# Patient Record
Sex: Female | Born: 1975 | Race: White | Hispanic: No | Smoking: Never smoker
Health system: Southern US, Community
[De-identification: ages and names within clinical notes are randomized; demographics above are authoritative.]

## PROBLEM LIST (undated history)

## (undated) DIAGNOSIS — Z8759 Personal history of other complications of pregnancy, childbirth and the puerperium: Secondary | ICD-10-CM

## (undated) DIAGNOSIS — G43109 Migraine with aura, not intractable, without status migrainosus: Secondary | ICD-10-CM

## (undated) DIAGNOSIS — E669 Obesity, unspecified: Secondary | ICD-10-CM

## (undated) DIAGNOSIS — N920 Excessive and frequent menstruation with regular cycle: Secondary | ICD-10-CM

## (undated) DIAGNOSIS — Z9889 Other specified postprocedural states: Secondary | ICD-10-CM

## (undated) DIAGNOSIS — M47812 Spondylosis without myelopathy or radiculopathy, cervical region: Secondary | ICD-10-CM

## (undated) DIAGNOSIS — K5790 Diverticulosis of intestine, part unspecified, without perforation or abscess without bleeding: Secondary | ICD-10-CM

## (undated) DIAGNOSIS — E611 Iron deficiency: Secondary | ICD-10-CM

## (undated) DIAGNOSIS — K589 Irritable bowel syndrome without diarrhea: Secondary | ICD-10-CM

## (undated) DIAGNOSIS — E559 Vitamin D deficiency, unspecified: Secondary | ICD-10-CM

## (undated) DIAGNOSIS — M26629 Arthralgia of temporomandibular joint, unspecified side: Secondary | ICD-10-CM

## (undated) HISTORY — DX: Iron deficiency: E61.1

## (undated) HISTORY — DX: Spondylosis without myelopathy or radiculopathy, cervical region: M47.812

## (undated) HISTORY — DX: Diverticulosis of intestine, part unspecified, without perforation or abscess without bleeding: K57.90

## (undated) HISTORY — DX: Arthralgia of temporomandibular joint, unspecified side: M26.629

## (undated) HISTORY — DX: Obesity, unspecified: E66.9

## (undated) HISTORY — PX: COLONOSCOPY: SHX174

## (undated) HISTORY — DX: Excessive and frequent menstruation with regular cycle: N92.0

## (undated) HISTORY — PX: TYMPANOSTOMY TUBE PLACEMENT: SHX32

## (undated) HISTORY — PX: CHOLECYSTECTOMY: SHX55

## (undated) HISTORY — DX: Vitamin D deficiency, unspecified: E55.9

## (undated) HISTORY — DX: Migraine with aura, not intractable, without status migrainosus: G43.109

## (undated) HISTORY — DX: Personal history of other complications of pregnancy, childbirth and the puerperium: Z87.59

## (undated) HISTORY — DX: Irritable bowel syndrome, unspecified: K58.9

## (undated) HISTORY — PX: LEEP: SHX91

## (undated) HISTORY — DX: Other specified postprocedural states: Z98.890

---

## 1999-02-03 ENCOUNTER — Other Ambulatory Visit: Admission: RE | Admit: 1999-02-03 | Discharge: 1999-02-03 | Payer: Self-pay | Admitting: Gynecology

## 1999-05-08 ENCOUNTER — Other Ambulatory Visit: Admission: RE | Admit: 1999-05-08 | Discharge: 1999-05-08 | Payer: Self-pay | Admitting: Gynecology

## 1999-07-01 ENCOUNTER — Other Ambulatory Visit: Admission: RE | Admit: 1999-07-01 | Discharge: 1999-07-01 | Payer: Self-pay | Admitting: Gynecology

## 1999-07-01 ENCOUNTER — Encounter (INDEPENDENT_AMBULATORY_CARE_PROVIDER_SITE_OTHER): Payer: Self-pay

## 1999-10-07 ENCOUNTER — Other Ambulatory Visit: Admission: RE | Admit: 1999-10-07 | Discharge: 1999-10-07 | Payer: Self-pay | Admitting: Gynecology

## 2000-02-16 ENCOUNTER — Other Ambulatory Visit: Admission: RE | Admit: 2000-02-16 | Discharge: 2000-02-16 | Payer: Self-pay | Admitting: Gynecology

## 2000-06-13 ENCOUNTER — Other Ambulatory Visit: Admission: RE | Admit: 2000-06-13 | Discharge: 2000-06-13 | Payer: Self-pay | Admitting: Gynecology

## 2000-10-10 ENCOUNTER — Other Ambulatory Visit: Admission: RE | Admit: 2000-10-10 | Discharge: 2000-10-10 | Payer: Self-pay | Admitting: Gynecology

## 2001-05-31 ENCOUNTER — Other Ambulatory Visit: Admission: RE | Admit: 2001-05-31 | Discharge: 2001-05-31 | Payer: Self-pay | Admitting: Obstetrics and Gynecology

## 2001-10-06 ENCOUNTER — Other Ambulatory Visit: Admission: RE | Admit: 2001-10-06 | Discharge: 2001-10-06 | Payer: Self-pay | Admitting: Gynecology

## 2002-05-11 ENCOUNTER — Encounter: Payer: Self-pay | Admitting: Gastroenterology

## 2002-05-11 ENCOUNTER — Ambulatory Visit (HOSPITAL_COMMUNITY): Admission: RE | Admit: 2002-05-11 | Discharge: 2002-05-11 | Payer: Self-pay | Admitting: Gastroenterology

## 2002-06-28 ENCOUNTER — Encounter: Payer: Self-pay | Admitting: Surgery

## 2002-06-28 ENCOUNTER — Observation Stay (HOSPITAL_COMMUNITY): Admission: RE | Admit: 2002-06-28 | Discharge: 2002-06-29 | Payer: Self-pay | Admitting: Surgery

## 2002-06-28 ENCOUNTER — Encounter (INDEPENDENT_AMBULATORY_CARE_PROVIDER_SITE_OTHER): Payer: Self-pay | Admitting: Specialist

## 2002-06-30 ENCOUNTER — Observation Stay (HOSPITAL_COMMUNITY): Admission: EM | Admit: 2002-06-30 | Discharge: 2002-07-01 | Payer: Self-pay | Admitting: Emergency Medicine

## 2002-06-30 ENCOUNTER — Encounter: Payer: Self-pay | Admitting: Emergency Medicine

## 2002-07-10 ENCOUNTER — Other Ambulatory Visit: Admission: RE | Admit: 2002-07-10 | Discharge: 2002-07-10 | Payer: Self-pay | Admitting: Gynecology

## 2002-07-24 ENCOUNTER — Encounter: Payer: Self-pay | Admitting: Gynecology

## 2002-07-24 ENCOUNTER — Encounter: Admission: RE | Admit: 2002-07-24 | Discharge: 2002-07-24 | Payer: Self-pay | Admitting: Gynecology

## 2002-10-18 HISTORY — PX: COLONOSCOPY: SHX174

## 2003-09-18 ENCOUNTER — Other Ambulatory Visit: Admission: RE | Admit: 2003-09-18 | Discharge: 2003-09-18 | Payer: Self-pay | Admitting: Gynecology

## 2004-09-23 ENCOUNTER — Other Ambulatory Visit: Admission: RE | Admit: 2004-09-23 | Discharge: 2004-09-23 | Payer: Self-pay | Admitting: Gynecology

## 2005-05-17 ENCOUNTER — Other Ambulatory Visit: Admission: RE | Admit: 2005-05-17 | Discharge: 2005-05-17 | Payer: Self-pay | Admitting: Gynecology

## 2005-08-05 ENCOUNTER — Inpatient Hospital Stay (HOSPITAL_COMMUNITY): Admission: AD | Admit: 2005-08-05 | Discharge: 2005-08-06 | Payer: Self-pay | Admitting: Gynecology

## 2005-12-16 ENCOUNTER — Inpatient Hospital Stay (HOSPITAL_COMMUNITY): Admission: RE | Admit: 2005-12-16 | Discharge: 2005-12-19 | Payer: Self-pay | Admitting: Gynecology

## 2006-01-20 ENCOUNTER — Other Ambulatory Visit: Admission: RE | Admit: 2006-01-20 | Discharge: 2006-01-20 | Payer: Self-pay | Admitting: Gynecology

## 2007-04-27 DIAGNOSIS — Z8759 Personal history of other complications of pregnancy, childbirth and the puerperium: Secondary | ICD-10-CM

## 2007-04-27 HISTORY — DX: Personal history of other complications of pregnancy, childbirth and the puerperium: Z87.59

## 2007-06-12 LAB — CONVERTED CEMR LAB: Pap Smear: NORMAL

## 2007-07-25 ENCOUNTER — Ambulatory Visit (HOSPITAL_COMMUNITY): Admission: AD | Admit: 2007-07-25 | Discharge: 2007-07-26 | Payer: Self-pay | Admitting: Obstetrics and Gynecology

## 2007-07-26 ENCOUNTER — Encounter (INDEPENDENT_AMBULATORY_CARE_PROVIDER_SITE_OTHER): Payer: Self-pay | Admitting: Obstetrics and Gynecology

## 2007-07-26 HISTORY — PX: LAPAROSCOPY FOR ECTOPIC PREGNANCY: SUR765

## 2007-09-20 ENCOUNTER — Ambulatory Visit: Payer: Self-pay | Admitting: Internal Medicine

## 2007-09-20 LAB — CONVERTED CEMR LAB
Albumin: 3.9 g/dL (ref 3.5–5.2)
Alkaline Phosphatase: 77 units/L (ref 39–117)
BUN: 11 mg/dL (ref 6–23)
Bilirubin, Direct: 0.1 mg/dL (ref 0.0–0.3)
Calcium: 9 mg/dL (ref 8.4–10.5)
Crystals: NEGATIVE
Eosinophils Absolute: 0.1 10*3/uL (ref 0.0–0.7)
GFR calc Af Amer: 109 mL/min
GFR calc non Af Amer: 90 mL/min
HCT: 40.3 % (ref 36.0–46.0)
HDL: 46.6 mg/dL (ref >39.0)
Ketones, ur: NEGATIVE mg/dL
MCHC: 34 g/dL (ref 30.0–36.0)
MCV: 85 fL (ref 78.0–100.0)
Monocytes Absolute: 0.5 10*3/uL (ref 0.1–1.0)
Neutro Abs: 3 10*3/uL (ref 1.4–7.7)
Platelets: 264 10*3/uL (ref 150–400)
Potassium: 4.4 meq/L (ref 3.5–5.1)
RDW: 12.5 % (ref 11.5–14.6)
Sodium: 141 meq/L (ref 135–145)
Triglycerides: 53 mg/dL (ref 0–149)
Urine Glucose: NEGATIVE mg/dL
VLDL: 11 mg/dL (ref 0–40)

## 2007-09-21 ENCOUNTER — Telehealth: Payer: Self-pay | Admitting: Internal Medicine

## 2007-09-29 ENCOUNTER — Ambulatory Visit: Payer: Self-pay | Admitting: Internal Medicine

## 2007-09-29 DIAGNOSIS — E66812 Obesity, class 2: Secondary | ICD-10-CM

## 2007-09-29 DIAGNOSIS — E669 Obesity, unspecified: Secondary | ICD-10-CM

## 2007-09-29 HISTORY — DX: Obesity, class 2: E66.812

## 2007-09-29 HISTORY — DX: Obesity, unspecified: E66.9

## 2007-10-06 ENCOUNTER — Encounter: Admission: RE | Admit: 2007-10-06 | Discharge: 2007-10-06 | Payer: Self-pay | Admitting: Internal Medicine

## 2007-10-08 ENCOUNTER — Telehealth: Payer: Self-pay | Admitting: Internal Medicine

## 2008-06-28 ENCOUNTER — Ambulatory Visit (HOSPITAL_COMMUNITY): Admission: RE | Admit: 2008-06-28 | Discharge: 2008-06-28 | Payer: Self-pay | Admitting: Obstetrics and Gynecology

## 2008-07-11 ENCOUNTER — Ambulatory Visit (HOSPITAL_COMMUNITY): Admission: RE | Admit: 2008-07-11 | Discharge: 2008-07-11 | Payer: Self-pay | Admitting: Obstetrics and Gynecology

## 2008-08-22 ENCOUNTER — Ambulatory Visit (HOSPITAL_COMMUNITY): Admission: RE | Admit: 2008-08-22 | Discharge: 2008-08-22 | Payer: Self-pay | Admitting: Obstetrics and Gynecology

## 2008-09-19 ENCOUNTER — Ambulatory Visit (HOSPITAL_COMMUNITY): Admission: RE | Admit: 2008-09-19 | Discharge: 2008-09-19 | Payer: Self-pay | Admitting: Obstetrics and Gynecology

## 2008-12-03 ENCOUNTER — Inpatient Hospital Stay (HOSPITAL_COMMUNITY): Admission: RE | Admit: 2008-12-03 | Discharge: 2008-12-06 | Payer: Self-pay | Admitting: Obstetrics and Gynecology

## 2008-12-21 IMAGING — US US SOFT TISSUE HEAD/NECK
1 series · 14 of 25 positions shown · non-contrast
Comparison: None

CLINICAL DATA: Prominence of the right side of the thyroid
clinically

THYROID ULTRASOUND
TECHNIQUE: Ultrasound examination of the thyroid gland and all
adjacent soft tissues was performed.

[Series 1: us soft tissue head/neck · 0.08mm/px · 14 of 38 slices shown]
[im 1/38]
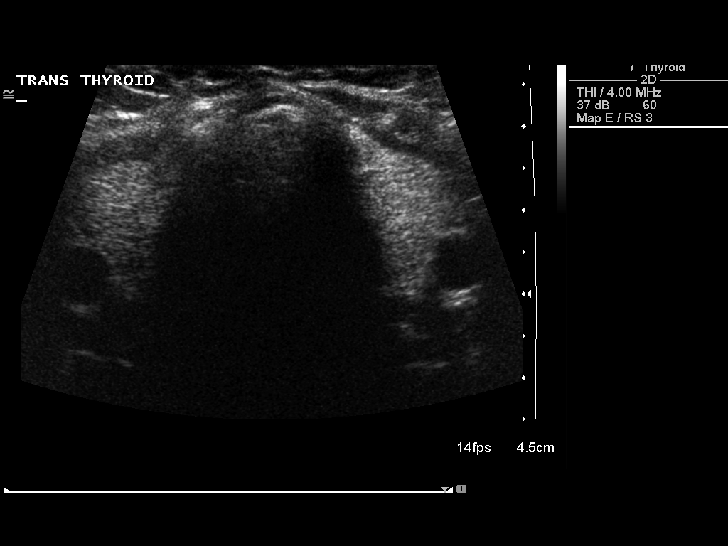
[im 4/38]
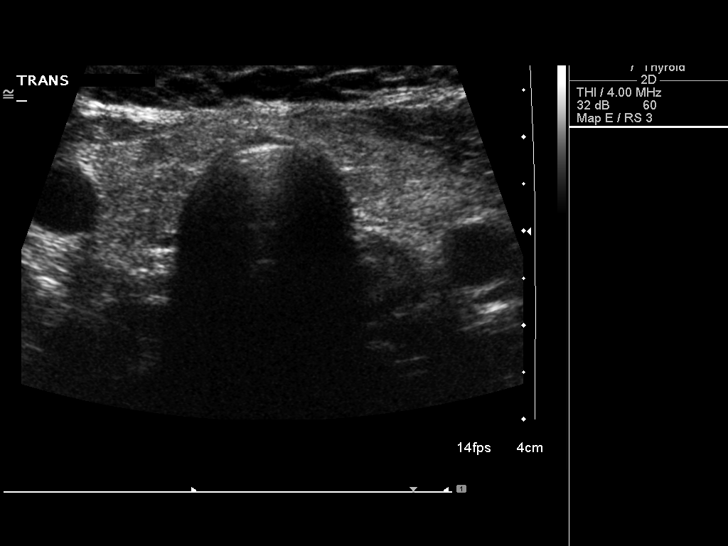
[im 7/38]
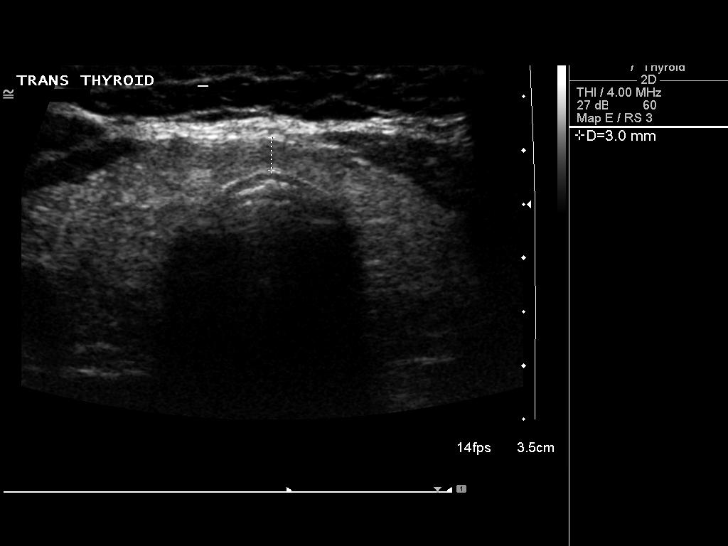
[im 10/38]
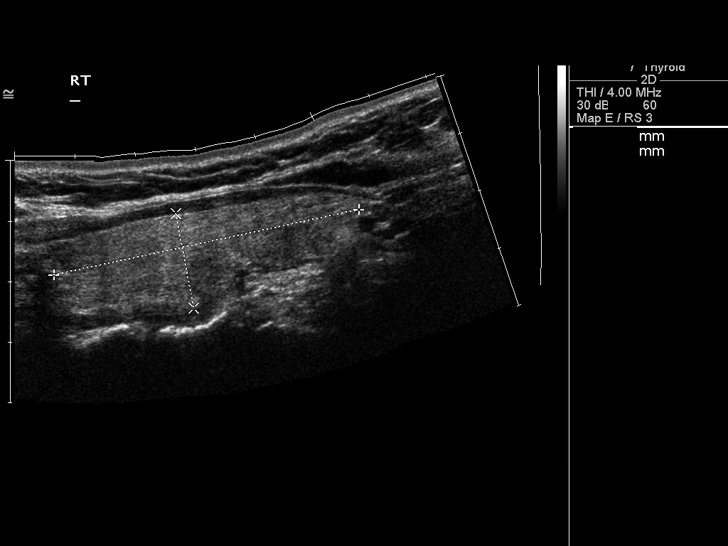
[im 13/38]
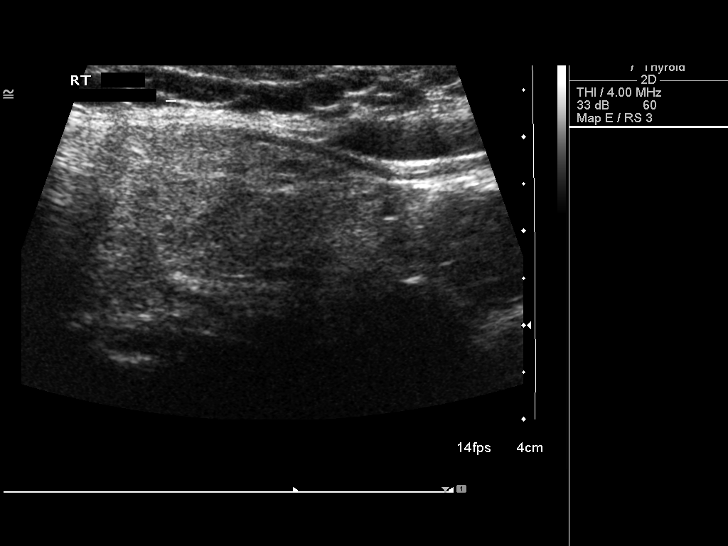
[im 14/38]
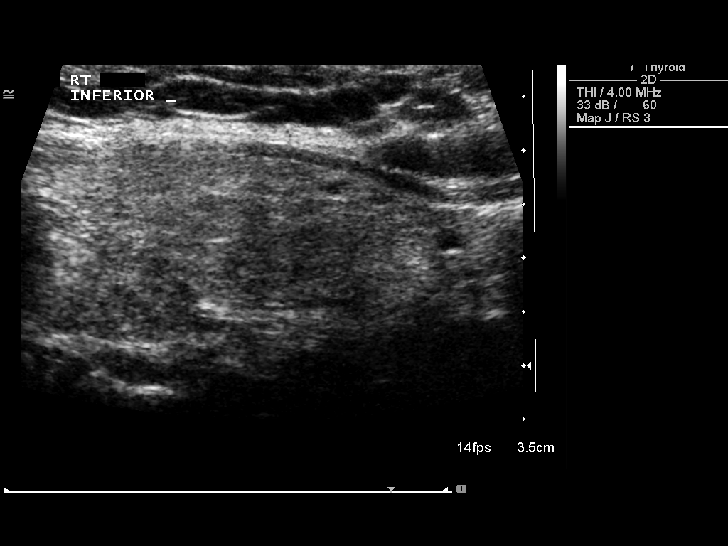
[im 17/38]
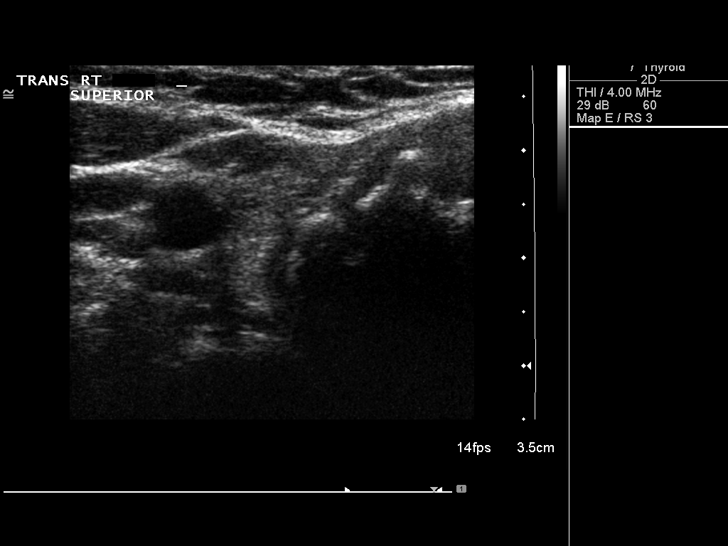
[im 21/38]
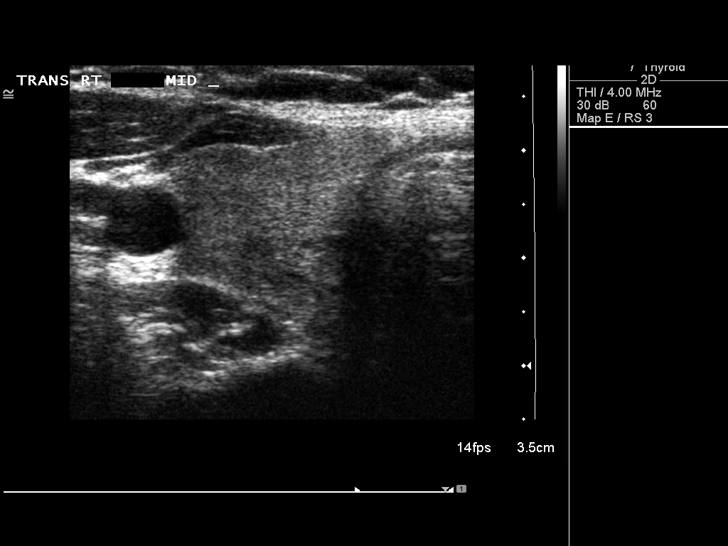
[im 24/38]
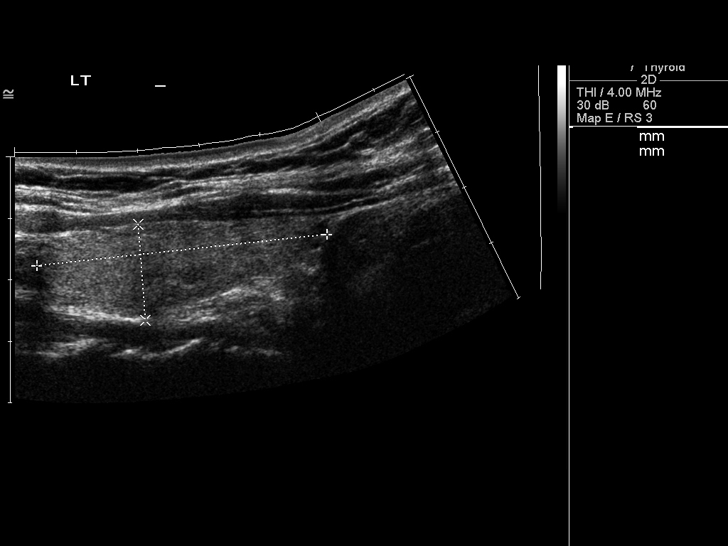
[im 25/38]
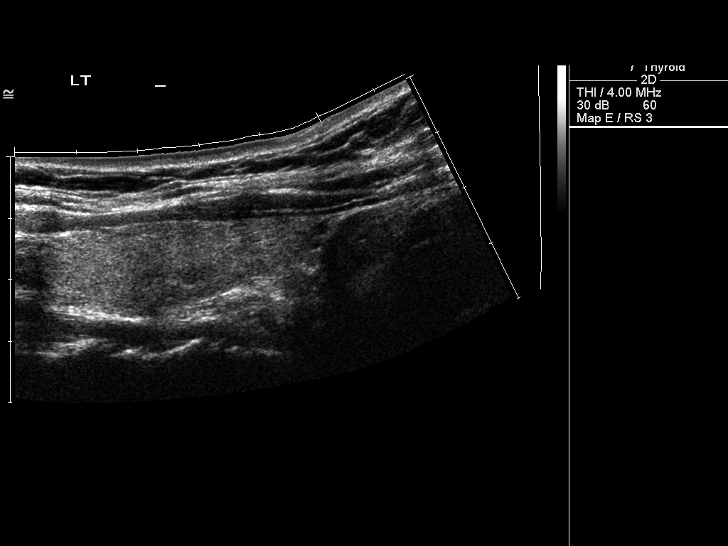
[im 28/38]
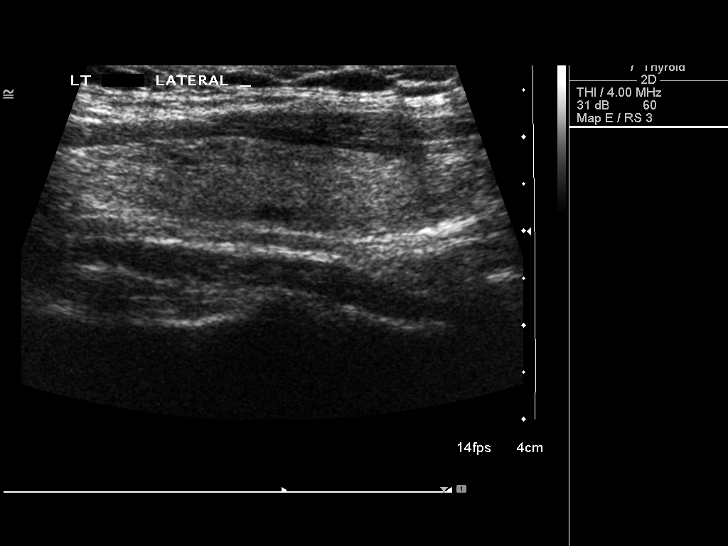
[im 31/38]
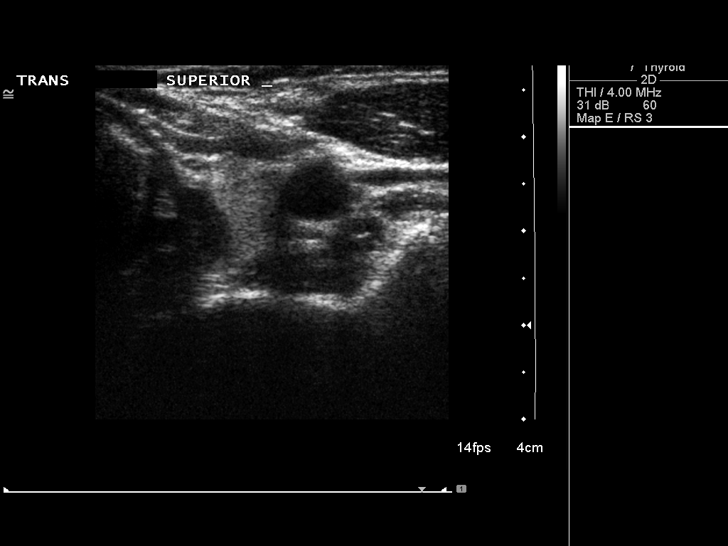
[im 34/38]
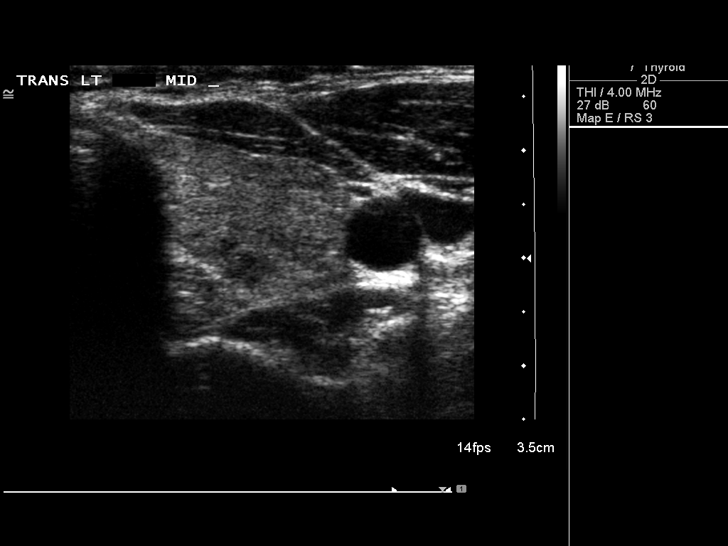
[im 38/38]
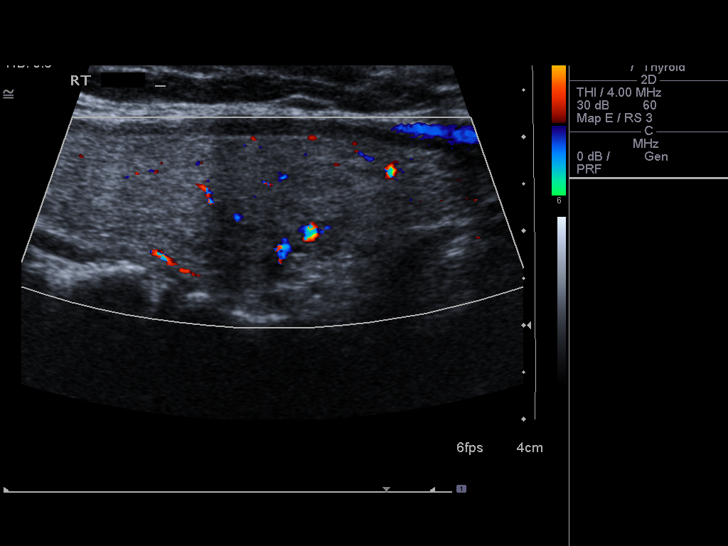

[14 of 25 positions shown; findings below may reference images not displayed]

FINDINGS: The thyroid gland is within normal limits in size.  The
right lobe measures 5.2 cm sagittally with a depth of 1.6 cm and
width of 1.7 cm.  The left lobe measures 4.7 x 1.6 x 1.7 cm with
the isthmus measuring 3 mm.  No solid or cystic nodule is seen.
IMPRESSION: The thyroid gland is within normal limits in size with no solid or
cystic nodule noted.

## 2009-02-28 ENCOUNTER — Encounter: Admission: RE | Admit: 2009-02-28 | Discharge: 2009-02-28 | Payer: Self-pay | Admitting: Internal Medicine

## 2009-06-10 LAB — CONVERTED CEMR LAB: Pap Smear: NORMAL

## 2009-07-23 ENCOUNTER — Ambulatory Visit: Payer: Self-pay | Admitting: Internal Medicine

## 2009-07-23 ENCOUNTER — Emergency Department (HOSPITAL_BASED_OUTPATIENT_CLINIC_OR_DEPARTMENT_OTHER): Admission: EM | Admit: 2009-07-23 | Discharge: 2009-07-23 | Payer: Self-pay | Admitting: Emergency Medicine

## 2009-07-25 ENCOUNTER — Ambulatory Visit: Payer: Self-pay | Admitting: Internal Medicine

## 2009-08-01 ENCOUNTER — Ambulatory Visit: Payer: Self-pay | Admitting: Internal Medicine

## 2009-08-01 LAB — CONVERTED CEMR LAB
AST: 19 units/L (ref 0–37)
Albumin: 3.9 g/dL (ref 3.5–5.2)
Basophils Absolute: 0 10*3/uL (ref 0.0–0.1)
CO2: 29 meq/L (ref 19–32)
Calcium: 8.9 mg/dL (ref 8.4–10.5)
Cholesterol: 183 mg/dL (ref 0–200)
Creatinine, Ser: 0.6 mg/dL (ref 0.4–1.2)
Eosinophils Absolute: 0.1 10*3/uL (ref 0.0–0.7)
HCT: 38.2 % (ref 36.0–46.0)
HDL: 58.4 mg/dL (ref >39.00)
Hemoglobin: 13.2 g/dL (ref 12.0–15.0)
Ketones, ur: NEGATIVE mg/dL
Leukocytes, UA: NEGATIVE
Lymphs Abs: 2.7 10*3/uL (ref 0.7–4.0)
MCHC: 34.6 g/dL (ref 30.0–36.0)
MCV: 84.2 fL (ref 78.0–100.0)
Monocytes Absolute: 0.5 10*3/uL (ref 0.1–1.0)
Neutro Abs: 4 10*3/uL (ref 1.4–7.7)
Nitrite: NEGATIVE
Platelets: 297 10*3/uL (ref 150.0–400.0)
RDW: 13.9 % (ref 11.5–14.6)
Sodium: 140 meq/L (ref 135–145)
Specific Gravity, Urine: <=1.005 (ref 1.000–1.030)
TSH: 1.28 microintl units/mL (ref 0.35–5.50)
Total Bilirubin: 0.4 mg/dL (ref 0.3–1.2)
Total CHOL/HDL Ratio: 3
Triglycerides: 45 mg/dL (ref 0.0–149.0)
Urobilinogen, UA: 0.2 (ref 0.0–1.0)
pH: 6.5 (ref 5.0–8.0)

## 2009-08-19 ENCOUNTER — Ambulatory Visit: Payer: Self-pay | Admitting: Internal Medicine

## 2009-09-11 ENCOUNTER — Ambulatory Visit (HOSPITAL_COMMUNITY): Admission: RE | Admit: 2009-09-11 | Discharge: 2009-09-11 | Payer: Self-pay | Admitting: Internal Medicine

## 2010-01-29 ENCOUNTER — Ambulatory Visit: Payer: Self-pay | Admitting: Internal Medicine

## 2010-04-28 ENCOUNTER — Ambulatory Visit
Admission: RE | Admit: 2010-04-28 | Discharge: 2010-04-28 | Payer: Self-pay | Source: Home / Self Care | Attending: Internal Medicine | Admitting: Internal Medicine

## 2010-04-28 DIAGNOSIS — M2550 Pain in unspecified joint: Secondary | ICD-10-CM | POA: Insufficient documentation

## 2010-05-26 NOTE — Assessment & Plan Note (Signed)
Summary: CPX/MHF   Vital Signs:  Patient profile:   35 year old female Height:      63.5 inches Weight:      215.25 pounds BMI:     37.67 O2 Sat:      98 % on Room air Temp:     98.3 degrees F oral Pulse rate:   83 / minute Pulse rhythm:   regular Resp:     16 per minute BP sitting:   100 / 60  (left arm) Cuff size:   large  Vitals Entered By: Glendell Docker CMA (August 19, 2009 11:10 AM)  O2 Flow:  Room air  Primary Care Provider:  D. Thomos Lemons DO   History of Present Illness: 35 y/o white female for routine cpx. 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  The patient denies chest pain, syncope, dyspnea on exertion, peripheral edema, prolonged cough, abdominal pain, melena, hematochezia, severe indigestion/heartburn, and depression.    Physical Exam  General:  alert and overweight-appearing.   Head:  normocephalic and atraumatic.   Eyes:  pupils equal, pupils round, and pupils reactive to light.   Ears:  R ear normal and L ear normal.   Mouth:  good dentition and pharynx pink and moist.     Neck:  supple and no masses.   Lungs:  normal respiratory effort and normal breath sounds.   Heart:  normal rate, regular rhythm, no murmur, and no gallop.   Abdomen:  soft, non-tender, normal bowel sounds, no hepatomegaly, and no splenomegaly.   Extremities:  No lower extremity edema  Neurologic:  cranial nerves II-XII intact and gait normal.   Psych:  normally interactive, good eye contact, not anxious appearing, and not depressed appearing.     Impression & Recommendations:  Problem # 1:  HEALTH MAINTENANCE EXAM (ICD-V70.0) Reviewed adult health maintenance protocols. Pt counseled on diet and exercise.  we discussed wt loss strategies   Pap smear: normal (06/10/2009) Td Booster: Tdap (09/29/2007)   Flu Vax: Historical (02/24/2009)   Chol: 183 (08/01/2009)   HDL: 58.40 (08/01/2009)   LDL: 116 (08/01/2009)   TG: 45.0 (08/01/2009) TSH: 1.28 (08/01/2009)     Patient Instructions: 1)  [email or web address] 2)  Please schedule a follow-up appointment in 1 year.  Current Allergies (reviewed today): No known allergies

## 2010-05-26 NOTE — Assessment & Plan Note (Signed)
Summary: diarrhea x 5 days/mhf   Vital Signs:  Patient profile:   35 year old female Height:      63.5 inches Weight:      206.25 pounds BMI:     36.09 O2 Sat:      98 % on Room air Temp:     97.4 degrees F oral Pulse rate:   103 / minute Pulse rhythm:   regular Resp:     16 per minute BP sitting:   84 / 60  (right arm) Cuff size:   large  Vitals Entered By: Glendell Docker CMA (July 23, 2009 10:12 AM)  O2 Flow:  Room air CC: Rm 2- Diarrhea    Primary Care Provider:  Dondra Spry DO  CC:  Rm 2- Diarrhea .  History of Present Illness:  Diarrhea      This is a 35 year old woman who presents with severe diarrhea.  Her symptoms started on Friday.  The patient reports 30 stools per day and watery/unformed stools, but denies blood in stool and mucus in stool.  Associated symptoms include lightheadedness.  pt and husband ate cashews.  he had much less severe diarrhea on Saturday.  nausea but no vomiting.  poor appetite and fatigue.  she is breast feeding.   poor milk production.  she denies recent abx use     Preventive Screening-Counseling & Management  Alcohol-Tobacco     Smoking Status: never  Allergies (verified): No Known Drug Allergies  Past History:  Past Medical History: Obesity Allergic rhinitis Ectopic pregnancy   Past Surgical History: Caesarean section 11/2005 & 11/2008 LEEP procedure 12/1997 and 04/1998 Laparoscopic left salpingostomy, resection of ectopic and  evacuation of hemoperitoneum.  Cholecystectomy 2004 3/09 Ectopic Pregnancy   Family History: Mother is bipolar and diabetes Father has hypertension Maternal grandmother - PAD, died during carotid surgery   Social History: Occupation: Homemaker Married Never Smoked Alcohol use-no Drug use-no 83 month old son  Physical Exam  General:  alert and overweight-appearing.   Head:  normocephalic and atraumatic.   Ears:  R ear normal and L ear normal.   Mouth:  Oral mucosa and oropharynx  without lesions or exudates.  Teeth in good repair. Lungs:  normal respiratory effort and normal breath sounds.   Heart:  regular rhythm and tachycardia.   Abdomen:  soft, no masses, no guarding, and no rigidity.  mild diffuse tenderness,  normal bowel sounds     Impression & Recommendations:  Problem # 1:  DIARRHEA (ICD-787.91) 35 y/o with severe diarrhea.  probable infectious in origin (samonella).  pt dehydrated.  Pt directed to ER for IV fluids.   tx with ceftin.  use zofran for nausea.  after ER discharge we discused drinking small amount q 10 mins. use welchol for diarrhea. Pt advised to bottle feed until GI symptoms resolved.    Complete Medication List: 1)  P D Natal Vitamins/folic Acid Tabs (Prenatal multivit-min-fe-fa) .... Take 1 tablet by mouth once a day 2)  Welchol 625 Mg Tabs (Colesevelam hcl) .Marland Kitchen.. 1 tab three times a day prn 3)  Ondansetron Hcl 4 Mg Tabs (Ondansetron hcl) .... One by mouth two times a day prn 4)  Cefuroxime Axetil 500 Mg Tabs (Cefuroxime axetil) .... One by mouth bid  Patient Instructions: 1)  Increase fluids as directed. 2)  BRAT diet. 3)  Call our office if your symptoms do not  improve or gets worse. Prescriptions: CEFUROXIME AXETIL 500 MG TABS (CEFUROXIME AXETIL) one  by mouth bid  #10 x 0   Entered and Authorized by:   D. Thomos Lemons DO   Signed by:   D. Thomos Lemons DO on 07/23/2009   Method used:   Electronically to        [facility]  [place] [address] (retail)       [address]       [place], [place]  [address]       Ph: [phone] or [phone]       Fax: [phone]   RxID:   [id number] ONDANSETRON HCL 4 MG TABS (ONDANSETRON HCL) one by mouth two times a day prn  #21 x 0   Entered and Authorized by:   D. Thomos Lemons DO   Signed by:   D. Thomos Lemons DO on 07/23/2009   Method used:   [facility]        [facility]  [place] [address] (retail[facility]       [address]       [place], [place]  [address]       Ph: [phone] or [phone]       Fax:  [phone]   RxID:   [id number] WELCHOL 625 MG TABS (COLESEVELAM HCL) 1 tab three times a day prn  #30 x 0   Entered and Authorized by:   D. Thomos Lemons DO   Signed by:   D. Thomos Lemons DO on 07/23/2009   Method used:   [facility]        [facility]  [place] [address] [facility]       [address]       [place], [place]  [address]       Ph: [phone] or [phone]       Fax: [phone]   RxID:   [id number]    Preventive Care Screening  Pap Smear:    Date:  06/10/2009    Results:  normal     Immunization History:  Influenza Immunization History:    Influenza:  historical (02/24/2009)   Current Allergies (reviewed today): No known allergies

## 2010-05-26 NOTE — Assessment & Plan Note (Signed)
Summary: flu shot/mhf  Nurse Visit   Allergies: No Known Drug Allergies  Immunizations Administered:  Influenza Vaccine # 1:    Vaccine Type: Fluvax 3+    Site: right deltoid    Mfr: GlaxoSmithKline    Dose: 0.5 ml    Route: IM    Given by: Glendell Docker CMA    Exp. Date: 10/24/2010    Lot #: [id number]    VIS given: 11/18/09 version given January 29, 2010.  Flu Vaccine Consent Questions:    Do you have a history of severe allergic reactions to this vaccine? no    Any prior history of allergic reactions to egg and/or gelatin? no    Do you have a sensitivity to the preservative Thimersol? no    Do you have a past history of Guillan-Barre Syndrome? no    Do you currently have an acute febrile illness? no    Have you ever had a severe reaction to latex? no    Vaccine information given and explained to patient? yes    Are you currently pregnant? no  Orders Added: 1)  Flu Vaccine 82yrs + [90658] 2)  Admin 1st Vaccine [06237]

## 2010-05-26 NOTE — Assessment & Plan Note (Signed)
Summary: f/u after seen for Dehydration in ER- jr   Vital Signs:  Patient profile:   35 year old female Weight:      215.25 pounds BMI:     37.67 O2 Sat:      99 % on Room air Temp:     98.1 degrees F oral Pulse rate:   74 / minute Pulse rhythm:   regular Resp:     24 per minute BP sitting:   112 / 70  (right arm) Cuff size:   large  Vitals Entered By: Glendell Docker CMA (July 25, 2009 3:16 PM)  O2 Flow:  Room air CC: Rm 2- Follow up on Dehydration Comments follow up from ER Discuss antibiotic if still needed   Primary Care Provider:  DThomos Lemons DO  CC:  Rm 2- Follow up on Dehydration.  History of Present Illness: 35 y/o white female for follow up pt seen in ER for dehydration from diarrhea she received 5-6 bags of IV fluids she is feeling much better diarrhea better stools are still loose but not watery diarrhea  Allergies (verified): No Known Drug Allergies  Past History:  Past Medical History: Obesity Allergic rhinitis  Ectopic pregnancy   Past Surgical History: Caesarean section 11/2005 & 11/2008  LEEP procedure 12/1997 and 04/1998 Laparoscopic left salpingostomy, resection of ectopic and  evacuation of hemoperitoneum.  Cholecystectomy 2004 3/09 Ectopic Pregnancy   Family History: Mother is bipolar and diabetes Father has hypertension Maternal grandmother - PAD, died during carotid surgery    Social History: Occupation: Homemaker Married  Never Smoked Alcohol use-no  Drug use-no 2 month old son  Physical Exam  General:  alert, well-developed, and well-nourished.   Lungs:  normal respiratory effort and normal breath sounds.   Heart:  normal rate, regular rhythm, and no gallop.   Abdomen:  soft, non-tender, normal bowel sounds, no guarding, and no rigidity.     Impression & Recommendations:  Problem # 1:  DIARRHEA (ICD-787.91) Assessment Improved Improved with rehydration.  even if salmonella infection we discussed self limiting nature.  If recurrent loose stools pt advised to call office.    Complete Medication List: 1)  P D Natal Vitamins/folic Acid Tabs (Prenatal multivit-min-fe-fa) .... Take 1 tablet by mouth once a day  Current Allergies (reviewed today): No known allergies

## 2010-05-28 NOTE — Assessment & Plan Note (Signed)
Summary: TALK TO dR [place]/MHF   Vital Signs:  Patient profile:   34 year old female Height:      63.5 inches Weight:      226.50 pounds BMI:     39.64 Temp:     98.3 degrees F oral Pulse rate:   84 / minute Resp:     18 per minute BP sitting:   122 / 72  (right arm) Cuff size:   large  Vitals Entered By: Glendell Docker CMA (April 28, 2010 2:52 PM) CC: Multiple concerns Is Patient Diabetic? No Pain Assessment Patient in pain? no        Primary Care Provider:  Dondra Spry DO  CC:  Multiple concerns.  History of Present Illness:  35 y/o female c/o msk pain intermittent left elbow pain - describes as shart also notes cramping sensation of lower neck / shoulder pt frequently holding her young child in her arms she also noticed small nodule behind left knee  she anxious about her health since her friend died unexpectly   Preventive Screening-Counseling & Management  Alcohol-Tobacco     Smoking Status: never  Allergies (verified): No Known Drug Allergies  Past History:  Past Medical History: Obesity Allergic rhinitis   Ectopic pregnancy    Past Surgical History: Caesarean section 11/2005 & 11/2008  LEEP procedure 12/1997 and 04/1998 Laparoscopic left salpingostomy, resection of ectopic and  evacuation of hemoperitoneum.  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   Lungs:  normal respiratory effort and normal breath sounds.   Heart:  normal rate, regular rhythm, and no gallop.   Msk:  tenderness  medial epicondyle Extremities:  No lower extremity edema  Skin:  mild scaly patch on both knee caps   Impression & Recommendations:  Problem # 1:  PAIN IN JOINT, MULTIPLE SITES (ICD-719.49) no inflammatory arthritis symptoms related to carrying her child on left side use OTC NSAIDs sparingly start excercise program   Problem # 2:  DERMATITIS (ICD-692.9) slight discoloration to skin on bilateral knee caps question mild psoriasis trial of topical steroid  Her updated medication list for this problem includes:    Betamethasone Valerate 0.1 % Crea (Betamethasone valerate) .Marland Kitchen... Apply two times a day x 1 week  Complete Medication List: 1)  Betamethasone Valerate 0.1 % Crea (Betamethasone valerate) .... Apply two times a day x 1 week  Patient Instructions: 1)  Please schedule a follow-up appointment in 6 months. Prescriptions: BETAMETHASONE VALERATE 0.1 % CREA (BETAMETHASONE VALERATE) apply two times a day x 1 week  #15 grams x 1   Entered and Authorized by:   D. Thomos Lemons DO   Signed by:   D. Thomos Lemons DO on 04/29/2010   Method used:   [facility]        [facility]  [place] [address] [facility]       [address]       [place], [place]  [address]       Ph: [phone] or  [phone]       Fax: [phone]   RxID:   [id number]    Orders Added: 1)  Est. Patient Level III [63016]    Current Allergies (reviewed today): No known allergies

## 2010-07-20 LAB — URINALYSIS, ROUTINE W REFLEX MICROSCOPIC
Glucose, UA: NEGATIVE mg/dL
Glucose, UA: NEGATIVE mg/dL
Nitrite: NEGATIVE
Protein, ur: 30 mg/dL — AB
Protein, ur: NEGATIVE mg/dL
Specific Gravity, Urine: 1.014 (ref 1.005–1.030)
Specific Gravity, Urine: 1.03 (ref 1.005–1.030)
Urobilinogen, UA: 0.2 mg/dL (ref 0.0–1.0)
pH: 5.5 (ref 5.0–8.0)

## 2010-07-20 LAB — BASIC METABOLIC PANEL
CO2: 21 mEq/L (ref 19–32)
Chloride: 108 mEq/L (ref 96–112)
GFR calc Af Amer: >60 mL/min (ref >60)
Glucose, Bld: 70 mg/dL (ref 70–99)
Sodium: 141 mEq/L (ref 135–145)

## 2010-07-20 LAB — URINE MICROSCOPIC-ADD ON

## 2010-07-20 LAB — URINE CULTURE

## 2010-08-01 LAB — CBC
HCT: 31.1 % — ABNORMAL LOW (ref 36.0–46.0)
MCHC: 34.1 g/dL (ref 30.0–36.0)
MCV: 85.4 fL (ref 78.0–100.0)
MCV: 85.9 fL (ref 78.0–100.0)
Platelets: 163 10*3/uL (ref 150–400)
RBC: 3.62 MIL/uL — ABNORMAL LOW (ref 3.87–5.11)
RBC: 4.46 MIL/uL (ref 3.87–5.11)
WBC: 10.4 10*3/uL (ref 4.0–10.5)

## 2010-08-01 LAB — RPR: RPR Ser Ql: NONREACTIVE

## 2010-09-08 NOTE — H&P (Signed)
NAME:  [name], [name] NO.:  [id number]   MEDICAL RECORD NO.:  [id number]          PATIENT TYPE:  MAT   LOCATION:  [facility]                          FACILITY:  [facility]   PHYSICIAN:  Huel Cote, M.D. DATE OF BIRTH:  09-14-1975   DATE OF ADMISSION:  DATE OF DISCHARGE:                              HISTORY & PHYSICAL   The patient is a 35 year old G4, P1-0-2-1, who is coming in for a  scheduled repeat cesarean section given that she is 39+ weeks and  declines a trial of labor.   Prenatal care was complicated by her prior C-section which, as stated,  she declined a trial of labor and desired a repeat.  She also had an  equivocal rubella status and will need her postpartum vaccination.  In  the first trimester screening, the patient has had normal blood work;  however, there was an increased nuchal translucency noted.  Therefore  she had a consult with the maternal fetal medicine and a repeat  ultrasound performed.  The nuchal translucency normalized and it was  felt that the patient still had a lower rate of possible Down syndrome.  She was counseled appropriately and after follow-up decided that she did  not wish to proceed with an amniocentesis.  Otherwise prenatal care was  relatively uneventful.   PAST OBSTETRICAL HISTORY:  Significant for in 2007, she had a C-section  for a 7 pounds 8 ounce infant for arrest of labor.  In 2009, she had a  left ectopic pregnancy with a left salpingostomy performed.  In October  2009, she had a spontaneous miscarriage.   PRENATAL LABS:  She is AB positive, antibody negative, rubella  equivocal, RPR negative, hepatitis B surface antigen negative, HIV  negative, GC negative, chlamydia negative, 1-hour Glucola 122.  First  trimester screen as stated was normal blood work and an increased nuchal  translucency was noted.  The patient's group B strep was also negative.   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   PAST SURGICAL HISTORY:  Significant for the LEEP x2, the ectopic with  laparoscopic salpingostomy in April 2009, cholecystectomy in 2004.   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   ALLERGIES:  None known.   PHYSICAL EXAMINATION:  The patient's weight is 242 pounds, blood  pressure 128/70.  CARDIAC EXAM:  Regular rate and rhythm.  LUNGS:  Clear.  ABDOMEN:  Soft, gravid, nontender.  PELVIC:  Cervix is 50, closed and high.   Again the patient was counseled as to her options and declined a trial  of labor stating she wished to proceed with a repeat cesarean section.  She was counseled as the risks and benefits of surgery including  bleeding, infection and possible damage to bowel and bladder.  She  understands these risks but desires to proceed as stated.      Huel Cote, M.D.  Electronically Signed     KR/MEDQ  D:  12/02/2008  T:  12/02/2008  Job:  [id number]

## 2010-09-08 NOTE — Discharge Summary (Signed)
[name], [name]               ACCOUNT NO.:  [id number]   MEDICAL RECORD NO.:  [id number]          PATIENT TYPE:  INP   LOCATION:  9146                          FACILITY:  [facility]   PHYSICIAN:  Huel Cote, M.D. DATE OF BIRTH:  1976-01-23   DATE OF ADMISSION:  12/03/2008  DATE OF DISCHARGE:  12/06/2008                               DISCHARGE SUMMARY   DISCHARGE DIAGNOSES:  1. Term pregnancy at 39 plus weeks, delivered.  2. Prior cesarean section, declined trial of labor.  3. Status post repeat low transverse C-section with two-layer closure      of uterus.   DISCHARGE MEDICATIONS:  1. Motrin 600 mg p.o. every 6 hours.  2. Percocet 1-2 tablets p.o. every 4 hours p.r.n.   DISCHARGE FOLLOWUP:  The patient is to followup in the office in 2 weeks  for incision check.   HOSPITAL COURSE:  The patient is a 35 year old G32, P1-0-2-1, who came in  for a scheduled repeat cesarean section, given a decline of a trial of  labor.  For her complete history and physical, please see previously  dictated H and P.  On December 03, 2008, she underwent a repeat low  transverse C-section, was delivered of a vigorous female infant, Apgars  were 8 and  9, weight was 7 pounds 11 ounces, normal uterus, ovaries and  tubes were noted.  She was then admitted for routine postop care.  On  postop day #2, hemoglobin was 10.6, down from 13.  She was doing well  and began to tolerate regular diet rather quickly.  By postop day #3,  she is ambulating without problem tolerating regular diet, voiding  without difficulty.  She was afebrile with stable vital signs.  Her  fundus was firm.  Her incision was clear.  Her staples removed and Steri-  Strips placed and she was felt stable for discharge home.  She was  discharged as stated on pelvic rest.  We will follow up in 2 weeks and  will have an incision check in the office at that time.      Huel Cote, M.D.  Electronically Signed     KR/MEDQ  D:   12/06/2008  T:  12/06/2008  Job:  [id number]

## 2010-09-08 NOTE — Op Note (Signed)
[name], [name]               ACCOUNT NO.:  [id number]   MEDICAL RECORD NO.:  [id number]          PATIENT TYPE:  INP   LOCATION:  9146                          FACILITY:  [facility]   PHYSICIAN:  Huel Cote, M.D. DATE OF BIRTH:  09-Apr-1976   DATE OF PROCEDURE:  12/03/2008  DATE OF DISCHARGE:                               OPERATIVE REPORT   PREOPERATIVE DIAGNOSES:  1. Term pregnancy at 39 plus weeks, delivered.  2. Prior cesarean section, declines trial of labor.   POSTOPERATIVE DIAGNOSES:  1. Term pregnancy at 39 plus weeks, delivered.  2. Prior cesarean section, declines trial of labor.   PROCEDURE:  Repeat low transverse cesarean section with two-layer  closure of uterus.   SURGEON:  Huel Cote, MD   ASSISTANT:  Zenaida Niece, MD   ANESTHESIA:  Spinal.   FINDINGS:  There is a vigorous female infant in vertex presentation,  Apgars were 8 and 9, weight was 7 pounds 11 ounces.  Normal uterus,  ovaries, and tubes were noted.   SPECIMEN:  Placenta was sent to L  and D after cord blood donation.   ESTIMATED BLOOD LOSS:  100 mL.   URINE OUTPUT:  300 mL, clear urine.   IV FLUIDS:  2800 mL of LR.   COMPLICATIONS:  No known complications.   DESCRIPTION OF PROCEDURE:  The patient was taken to the operating room  where spinal anesthesia was obtained without difficulty.  She was then  prepped and draped in normal sterile fashion in the dorsal supine  position with a leftward tilt with a Foley catheter in place.  A  Pfannenstiel skin incision was then made through a preexisting scar and  carried down to the underlying layer of fascia by sharp dissection and  Bovie cautery.  Fascia was nicked in the midline and the incision was  extended laterally with Mayo scissors.  The inferior aspect was then  elevated and dissected off the underlying rectus muscles.  Superior  aspect was likewise dissected upwards off the rectus muscles.  The  rectus muscles were then separated  in the midline and the peritoneal  cavity entered bluntly.  This was then extended both superiorly and  inferiorly with careful attention to avoid both bowel and bladder.  The  Alexis self-retaining wound retractor was then placed within the  incision and the uterus nicely exposed with the lower uterine segment,  had a small amount of adhesions of the bladder flap from her prior C-  section.  These were taken down with Metzenbaum scissors and the bladder  pushed away.  The lower uterine segment was slightly thin, but certainly  there was no window.  This was then incised in a transverse fashion and  the cavity itself entered bluntly.  The fluid was clear.  The infant's  head was then delivered atraumatically.  Nose and mouth were bulb  suctioned.  The remainder of the infant delivered without difficulty and  the cord was clamped and cut with the baby handed to awaiting  pediatricians.  The placenta was then expressed manually and was  posterior in its  location in the uterus.  After its delivery, the uterus  was slightly boggy.  It was exteriorized and responded to bimanual  massage and Pitocin.  It was then replaced within the abdominal cavity  and the uterine incision closed in two layers.  The first in a running  locked layer of 1-0 chromic and the second in an imbricating layer of  the same suture.  One additional figure-of-eight suture was placed along  the midline where there was a small amount of bleeding and then the  incision appeared completely hemostatic.  Ovaries and tubes were  inspected and found to be normal.  Small amount of irrigation was done  in the pelvis and the incision again appeared hemostatic after  observation.  Therefore, all instruments and sutures were removed from  the abdomen.  The rectus muscles and peritoneum were reapproximated in  several interrupted mattress sutures of 0 Vicryl.  The fascia was closed  with 0 Vicryl in a running fashion.  The subcutaneous  tissue was  reapproximated with 3-0 plain in a running fashion and the skin was  closed with staples.  Sponge, lap, and needle counts were correct x2 and  the patient was taken to the recovery room in good condition.      Huel Cote, M.D.  Electronically Signed     KR/MEDQ  D:  12/03/2008  T:  12/03/2008  Job:  [id number]

## 2010-09-08 NOTE — Op Note (Signed)
NAME:  [name], [name]               ACCOUNT NO.:  [id number]   MEDICAL RECORD NO.:  [id number]          PATIENT TYPE:  AMB   LOCATION:  MATC                          FACILITY:  [facility]   PHYSICIAN:  Huel Cote, M.D. DATE OF BIRTH:  08-Jan-1976   DATE OF PROCEDURE:  07/26/2007  DATE OF DISCHARGE:                               OPERATIVE REPORT   PREOPERATIVE DIAGNOSIS:  Probable left ectopic pregnancy.   POSTOPERATIVE DIAGNOSIS:  Left ectopic pregnancy partially ruptured at  the distal end.   PROCEDURE:  Laparoscopic left salpingostomy, resection of ectopic and  evacuation of hemoperitoneum.   SURGEON:  Dr. Huel Cote.   ANESTHESIA:  General.   FINDINGS:  There is a left ectopic with bleeding noted from the distal  end and approximately 50-100 mL of hemoperitoneum.   SPECIMEN:  Products of conception were sent from the tube to pathology.  The other ovary and tube appeared normal.  Uterus appeared normal and  the fimbriated end of the left tube appeared normal.   BLOOD LOSS:  Probably 100 mL counting the hemoperitoneum.   URINE OUTPUT:  50 mL straight cath prior to procedure.   IV FLUIDS:  2000 mL LR,   PROCEDURE:  The patient was taken to the operating room where general  anesthesia was obtained without difficulty.  She was then prepped and  draped in the normal sterile fashion in the dorsal lithotomy position.  A speculum was placed within the vagina and a Hulka tenaculum introduced  into the uterus for uterine manipulation.  The uterus was noted to be  mobile.  Attention was then turned to the patient's abdomen where a  small infraumbilical incision was made with scalpel approximately 1 cm  in length after injection with 0.25% Marcaine.  The Veress needle was  then introduced into the incision and pneumoperitoneum obtained with  approximately 3 liters of CO2 gas.  The OptiVu trocar was utilized then  with the camera to visualize the direct entry which was  accomplished  without difficulty 10-11 in size.  The abdomen and pelvis were then  inspected with findings as previously stated.  Two additional trocars  were placed 5 mm in size in the lateral quadrants under direct  visualization after injection with 0.25% Marcaine.  With these in place,  the left fallopian tube which was distally enlarged with the ectopic  pregnancy and had some distal bleeding out of its fimbriated end was  grasped with an atraumatic grasper.  A linear incision was then made  with harmonic scalpel approximately 1 cm in length and hydrodissection  was utilized as well as grasping the products of conception with a  atraumatic grasper.  These were teased out of the fallopian tube and  into the cul-de-sac.  At this point the edges of the fallopian tube  which had been opened were bleeding slightly and these were cauterized  with harmonic scalpel until no active bleeding was noted.  The  hemoperitoneum was evacuated with irrigation and the remainder of the  pelvis and anatomy appeared normal. After the fallopian tube was  reinspected several times, there  was no active bleeding noted.  Therefore the 5 mm camera was introduced and the small amount of  products of conception removed from the fallopian tube were grasped and  removed through a 10-11  trocar and handed off for pathology.  The 5 mm and 10-11 trocar were  then removed under direct visualization and after the pneumoperitoneum  reduced, the last 5 mm trocar removed as well.  Sponge, lap and needle  counts were correct x2 and the patient was awakened and taken to the  recovery room in stable condition.      Huel Cote, M.D.  Electronically Signed     KR/MEDQ  D:  07/26/2007  T:  07/26/2007  Job:  [id number]

## 2010-09-11 NOTE — Op Note (Signed)
NAME:  [name], [name]                         ACCOUNT NO.:  [id number]   MEDICAL RECORD NO.:  [id number]                   PATIENT TYPE:  AMB   LOCATION:  DAY                                  FACILITY:  [facility]   PHYSICIAN:  Currie Paris, M.D.           DATE OF BIRTH:  04-03-1976   DATE OF PROCEDURE:  06/28/2002  DATE OF DISCHARGE:                                 OPERATIVE REPORT   CCS#:  [id number]   PREOPERATIVE DIAGNOSES:  Biliary diskinesia with gallbladder polyp.   POSTOPERATIVE DIAGNOSES:  Biliary diskinesia with gallbladder polyp.   PROCEDURE:  Laparoscopic cholecystectomy with operative cholangiogram.   SURGEON:  Currie Paris, M.D.   ASSISTANT:  Sheppard Plumber. Earlene Plater, M.D.   ANESTHESIA:  General endotracheal.   CLINICAL HISTORY:  This patient is a 35 year old with biliary type symptoms  for several months and a finding of some biliary dysfunction on scan as well  as what appeared to be a gallbladder polyp on ultrasound. After discussion  of alternatives with the patient, she elected to proceed to laparoscopic  cholecystectomy.   DESCRIPTION OF PROCEDURE:  The patient was seen in the holding area and had  no further questions. She was taken to the operating room and after  satisfactory general anesthesia had been obtained, the abdomen was prepped  and draped. The 0.25% Marcaine was used for each incision and the umbilical  incision made first. The fascia was opened and the peritoneal cavity entered  under direct vision. A pursestring was placed and the Hasson introduced. The  abdomen was insufflated to 15.   With the camera in, we saw no gross abnormalities. The patient was placed in  reverse Trendelenburg and tilted to the left and additional trocars were  placed in the epigastrium and right lateral abdomen. There are multiple  omental adhesions to the gallbladder all the way to the top of the fundus.  These were taken down gently and the peritoneum around the  triangle of Calot  opened. The cystic artery was identified coming a little bit anterior to the  cystic duct and coming up onto the gallbladder. Once I dissected that out, I  went ahead and clipped it and divided it leaving two clips on the stay side  so that it had good exposure of the duct. With that done, I could then  dissect it better in the triangle of Calot. I made a nice window and then  clipped the cystic duct at its junction with the gallbladder. It was opened  and it was a very tiny duct but I was able to get a Cook catheter in and do  operative cholangiography.   The cholangiogram looked normal to me with a fairly long cystic duct  entering a little bit low on the common duct. Good filling of the hepatic  radicles and common duct and duodenum with no evidence of filling defects.   The catheter  was moved and two clips were placed on the stay side of the  duct and it was divided. What appeared to be a posterior branch of the  artery was identified, clipped and divided and the gallbladder removed from  below to above with coagulation current of the cautery. The bed of the  gallbladder appeared to be completely dry.   The gallbladder was brought out the umbilical port. Final irrigation check  for hemostasis was made and everything appeared to be dry. The lateral port  was removed under direct vision and there was no bleeding. The umbilical  port was removed and the pursestring tied down. The abdomen was deflated  through the epigastric port and then it was removed. The skin was closed  with 4-0 Monocryl subcuticular and Steri-Strips.   The patient tolerated the procedure well. There were no operative  complications. All counts were correct.                                               Currie Paris, M.D.    CJS/MEDQ  D:  06/28/2002  T:  06/28/2002  Job:  [id number]   cc:   Vania Rea. Jarold Motto, M.D. [facility]

## 2010-09-11 NOTE — Op Note (Signed)
[name], [name]               ACCOUNT NO.:  [id number]   MEDICAL RECORD NO.:  [id number]          PATIENT TYPE:  INP   LOCATION:  9104                          FACILITY:  [facility]   PHYSICIAN:  Ivor Costa. Farrel Gobble, M.D. DATE OF BIRTH:  09-13-1975   DATE OF PROCEDURE:  12/16/2005  DATE OF DISCHARGE:                                 OPERATIVE REPORT   PREOPERATIVE DIAGNOSIS:  Cephalopelvic disproportion.   POSTOP DIAGNOSIS:  Cephalopelvic disproportion.   PROCEDURE:  Primary cesarean section, low flap transverse.   SURGEON:  Ivor Costa. Farrel Gobble, M.D.   ANESTHESIA:  Epidural.   IV FLUIDS:  __________ mL of lactated Ringer's.   ESTIMATED BLOOD LOSS:  600 mL.   URINE OUTPUT:  125 mL of clear urine.   FINDINGS:  A viable female in the vertex presentation with a high vertex,  clear fluid.  Apgars 8 and 9, birth weight 7 pounds 8 ounces.  Normal  uterus, tubes, and ovaries.   COMPLICATIONS:  None.  Pathology none.  Blood for cord blood banking.   DESCRIPTION OF PROCEDURE:  The patient was taken to operating room and  epidural anesthesia previously induced was noted to be adequate.  She was  then prepped and draped in the usual sterile fashion, placed with a left  lateral displacement.  A Pfannenstiel skin incision was made with the  scalpel; and then carried through the underlying layer of fascia with Bovie.  The fascia was scored in the midline; and incision was extended laterally  with the Bovie.  The inferior aspect of the fascial incision was grasped  with Kochers, and underlying rectus muscles were dissected off by blunt and  sharp dissection.  In a similar fashion the superior aspect of the incision  was grasped with Kochers, and the underlying rectus muscles were dissected  off.  The rectus muscles were separated in the midline.  The peritoneum was  identified and entered bluntly.  The peritoneal incision was then extended  superiorly and inferiorly with good visualization of the  underlying bowel  and bladder.  The bladder blade was inserted.  The vesicouterine peritoneum  was identified and entered sharply with the Metzenbaum's.  The incision was  extended laterally.  The bladder flap was created digitally; and the bladder  blade was then reinserted; and the lower uterine segment incised in a  transverse fashion with the scalpel.  Incision was extended bluntly.   The infant was delivered from the vertex presentation.  There was a body  cord that the baby was delivered through.  The cord was cut and clamped and  handed off to waiting pediatrician.  Cord bloods were obtained.  The uterus  was massaged; and the placenta was allowed to separate naturally.  The  uterus was then cleared of all clots and debris.  The uterine incision was  repaired with a running locked layer of #0 chromic and a second suture was  used for imbrication.  The pelvis was irrigated with copious amounts of warm  saline.  The adnexa was inspected and noted to be unremarkable.  The  incision  was noted to be hemostatic.  The peritoneum, muscles, and fascia  were noted to be hemostatic.  The fascia was then closed with #0 Vicryl in a  running fashion.  The subcu was irrigated and reapproximated with  interrupted 3-0 plain.  Skin was closed with 4-0 Vicryl on a Mellody Dance.  The  patient tolerated the procedure well.  Sponge, lap, and needle counts were  correct x2; and she was transferred to the PACU in stable condition.  A  pressure dressing was placed.      Ivor Costa. Farrel Gobble, M.D.  Electronically Signed     THL/MEDQ  D:  12/16/2005  T:  12/17/2005  Job:  [id number]

## 2010-09-11 NOTE — Discharge Summary (Signed)
[name], [name]               ACCOUNT NO.:  [id number]   MEDICAL RECORD NO.:  [id number]          PATIENT TYPE:  INP   LOCATION:  9104                          FACILITY:  [facility]   PHYSICIAN:  Timothy P. Fontaine, M.D.DATE OF BIRTH:  [birth year]   DATE OF ADMISSION:  12/16/2005  DATE OF DISCHARGE:  12/19/2005                                 DISCHARGE SUMMARY   DISCHARGE DIAGNOSES:  1. Pregnancy at term.  2. Failure to progress.   PROCEDURE:  Primary low transverse cervical cesarean section, Dr. Douglass Rivers, 12/16/2005.   HOSPITAL COURSE:  35 year old G1 P0 female at [redacted] weeks gestation who was  admitted for induction due to post dates pregnancy.  The patient underwent  induction to include Pitocin augmentation, progressed to 5-6 cm dilatation,  -1 station at which point she made no further progress and underwent a  primary low transverse cervical cesarean section producing normal female  infant, Apgars eight and nine.  Weight was 7 pounds 8 ounces.  The patient's  postoperative course was uncomplicated.  She was discharged on postoperative  day #3 ambulating work well, tolerating a regular diet with a postoperative  hemoglobin of 11.1.  The patient's blood type AB positive and her rubella  titer is positive.  The patient received precautions, instructions and  follow-up will be seen in the office in 6 weeks following discharge received  a prescription for Tylox #30 one to two p.o. q. 46 hours p.r.n. pain.      Timothy P. Fontaine, M.D.  Electronically Signed     TPF/MEDQ  D:  12/19/2005  T:  12/20/2005  Job:  [id number]

## 2010-10-23 ENCOUNTER — Other Ambulatory Visit: Payer: Self-pay

## 2010-11-27 IMAGING — US US PELVIS COMPLETE MODIFY
1 series · 13 of 25 positions shown · non-contrast
Comparison: None

CLINICAL DATA: Question of missed abortion.  The patient passed
tissue and blood.  Question need for D&C.  Negative pregnancy test.
Prior ectopic pregnancy.  Left pelvic pain.  Prior LEEP procedure.
Prior C-section x2.  Gravida 5, para 2.

TRANSABDOMINAL AND TRANSVAGINAL ULTRASOUND OF PELVIS
TECHNIQUE: Both transabdominal and transvaginal ultrasound
examinations of the pelvis were performed including evaluation of
the uterus, ovaries, adnexal regions, and pelvic cul-de-sac.

[Series 1: us pelvis complete modify · 0.24mm/px · 13 of 52 slices shown]
[im 1/52]
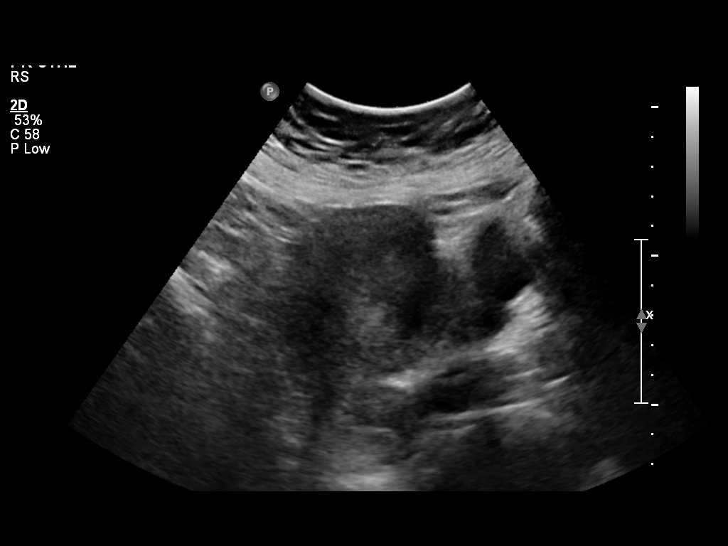
[im 5/52]
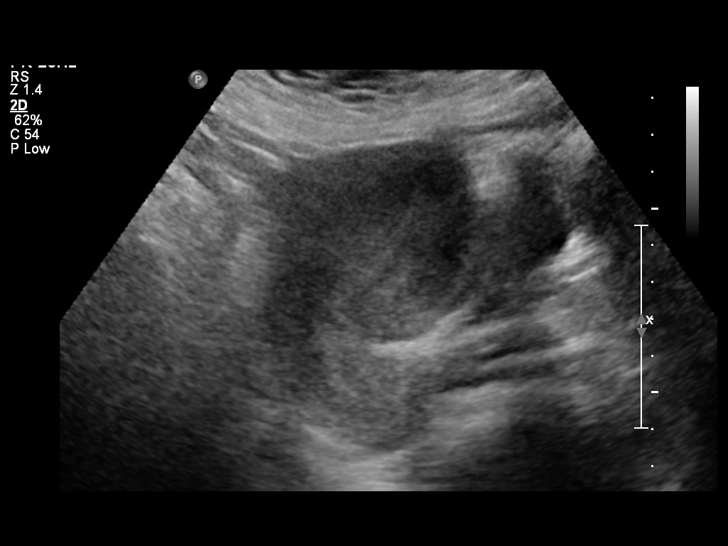
[im 9/52]
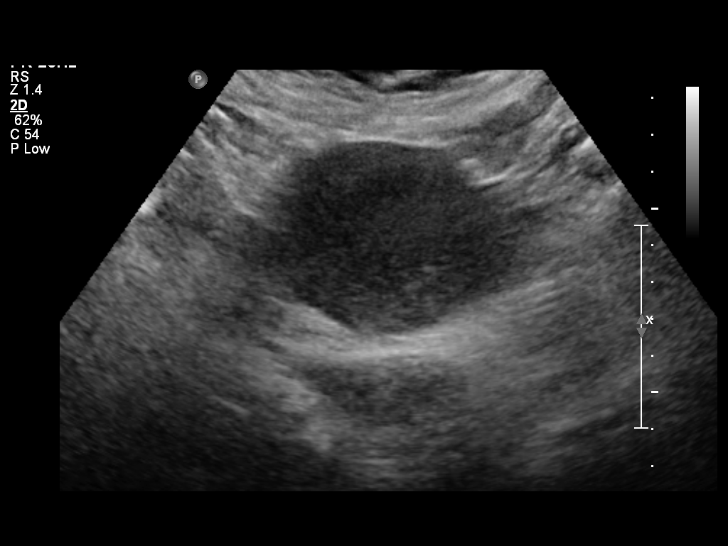
[im 13/52]
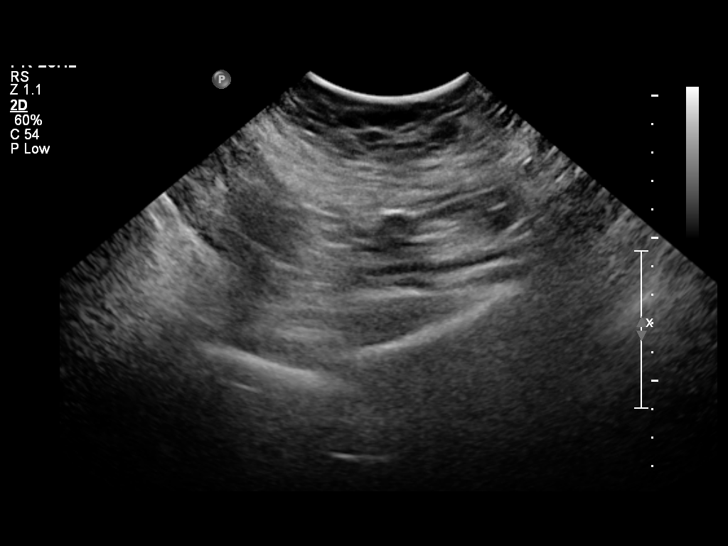
[im 18/52]
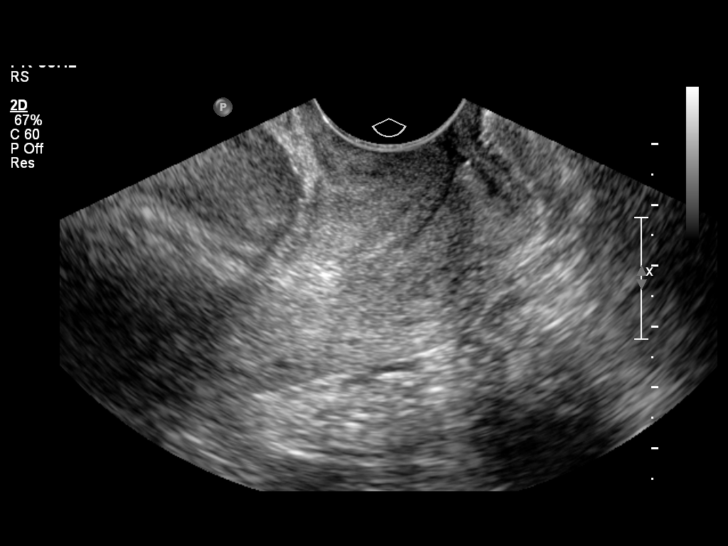
[im 22/52]
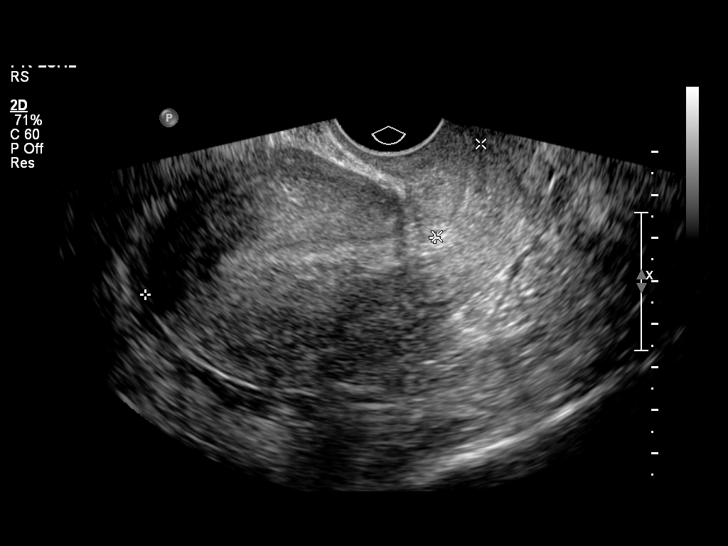
[im 26/52]
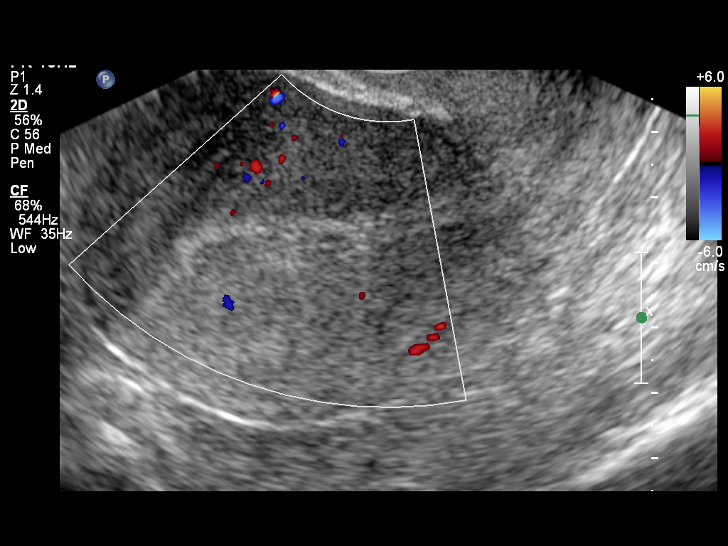
[im 30/52]
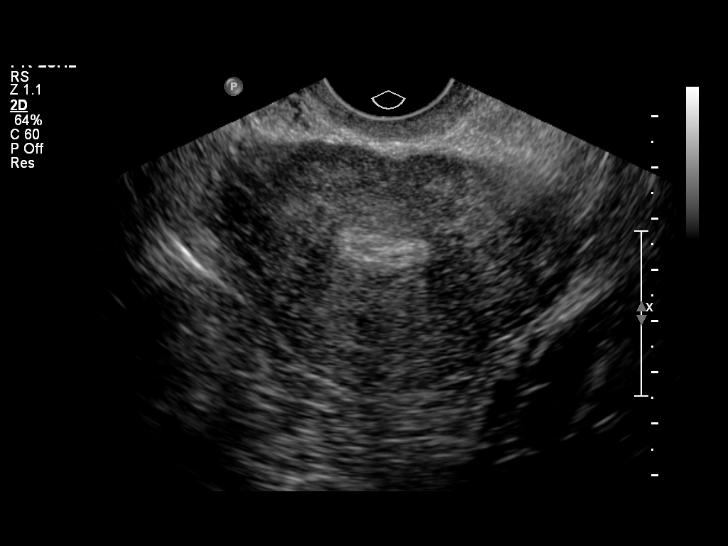
[im 35/52]
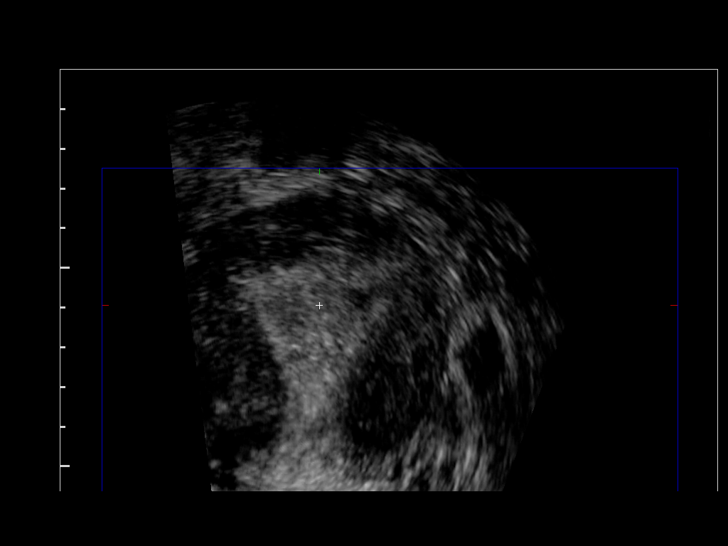
[im 39/52]
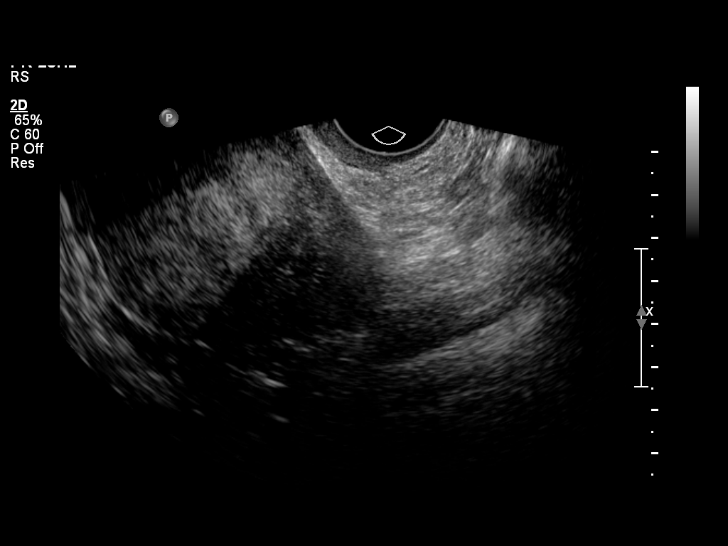
[im 43/52]
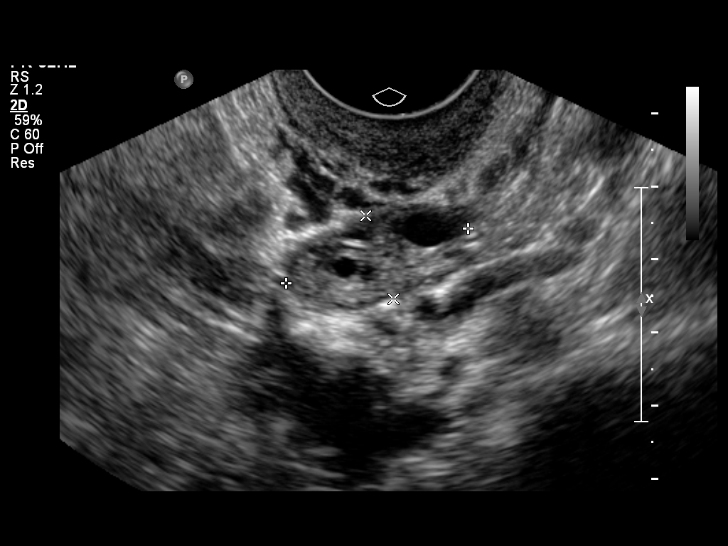
[im 47/52]
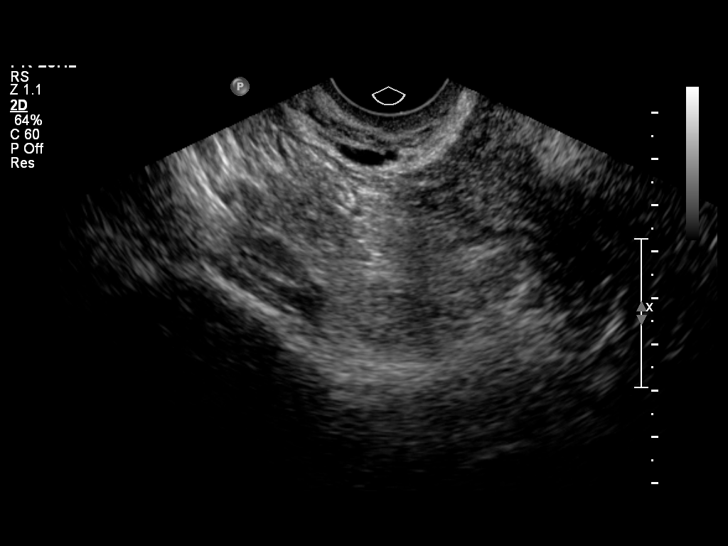
[im 52/52]
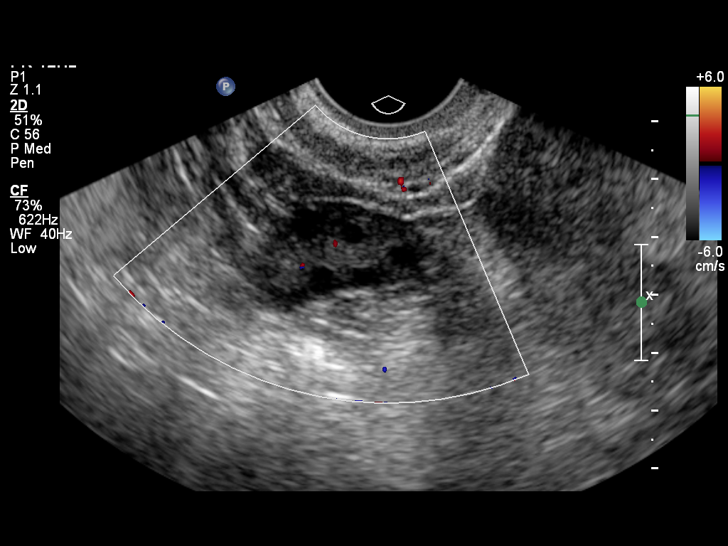

[13 of 25 positions shown; findings below may reference images not displayed]

FINDINGS: Uterus the uterus is 9.3 x 5.6 x 6.4 cm.

Endometrium the endometrium is slightly heterogeneous and measures
9.3 mm in thickness.  Given the heterogeneity, differential
diagnosis would include endometrial polyp, clot.  In the setting of
a positive pregnancy test, retained products of conception would
also be a consideration.

Right Ovary the right ovary is 2.0 x 2.3 x 3.1 cm and has a normal
appearance.

Left Ovary the left ovary is 2.6 x 1.2 x 1.3 cm and has a normal
appearance.

Other Findings:  No free pelvic fluid identified.
IMPRESSION: Slightly heterogeneous endometrium, raising the question of
endometrial polyp or clot.  Endometrial hyperplasia would also be a
consideration.  Retained products of conception would be a
consideration in the setting of a positive pregnancy test.

## 2011-01-19 LAB — COMPREHENSIVE METABOLIC PANEL
ALT: 15
AST: 17
CO2: 26
Calcium: 9.2
GFR calc Af Amer: >60
GFR calc non Af Amer: >60
Sodium: 137

## 2011-01-19 LAB — CBC
HCT: 38.8
Hemoglobin: 13.3
MCHC: 34.2
RDW: 13.4

## 2011-01-19 LAB — DIFFERENTIAL
Lymphocytes Relative: 33
Lymphs Abs: 2.9
Monocytes Relative: 7
Neutrophils Relative %: 60

## 2011-02-04 ENCOUNTER — Encounter: Payer: Self-pay | Admitting: Internal Medicine

## 2011-02-04 ENCOUNTER — Ambulatory Visit (INDEPENDENT_AMBULATORY_CARE_PROVIDER_SITE_OTHER): Payer: BC Managed Care – PPO | Admitting: Internal Medicine

## 2011-02-04 DIAGNOSIS — J069 Acute upper respiratory infection, unspecified: Secondary | ICD-10-CM

## 2011-02-04 MED ORDER — AMOXICILLIN 875 MG PO TABS: 875.0000 mg | ORAL_TABLET | Freq: Two times a day (BID) | ORAL | Status: AC

## 2011-02-04 NOTE — Progress Notes (Signed)
  Subjective:    Patient ID: [name], female    DOB: Jan 10, 1976, 35 y.o.   MRN: [id number]  HPI Pt presents to clinic for evaluation of ST. Notes 9 day h/o ST, bilateral ant neck pain, nasal congestion and drainage. No fever/chills. Seen at outside clinic with reported negative rapid strep, monospot and throat culture. No improvement. No alleviating or exacerbating factors.  No other complaints.  Past Medical History  Diagnosis Date  . Obesity   . Hx LEEP (loop electrosurgical excision procedure), cervix, pregnancy   . History of ectopic pregnancy   . Allergic rhinitis    Past Surgical History  Procedure Date  . Cesarean section      2007 & 2010  . Cholecystectomy     2004  . Laparoscopic salpingoopherectomy     resection of ectopic and evacutaion of hemoperitoneum    reports that she has never smoked. She has never used smokeless tobacco. She reports that she does not drink alcohol or use illicit drugs. family history includes Bipolar disorder in her mother; Diabetes in her mother; Hypertension in her father; and Peripheral vascular disease in her maternal grandmother. No Known Allergies     Review of Systems see hpi    Objective:   Physical Exam  Nursing note and vitals reviewed. Constitutional: She appears well-developed and well-nourished. No distress.  HENT:  Head: Normocephalic and atraumatic.  Right Ear: Tympanic membrane, external ear and ear canal normal.  Left Ear: Tympanic membrane, external ear and ear canal normal.  Nose: Nose normal.  Mouth/Throat: Uvula is midline, oropharynx is clear and moist and mucous membranes are normal. No oropharyngeal exudate.  Neck: Neck supple.  Lymphadenopathy:    She has cervical adenopathy.  Neurological: She is alert.  Skin: Skin is warm and dry. No rash noted. She is not diaphoretic.  Psychiatric: She has a normal mood and affect.          Assessment & Plan:

## 2011-02-04 NOTE — Assessment & Plan Note (Signed)
Despite neg strep w/u sx's are persistent. Begin abx course with amoxicillin x 7days. Followup if no improvement or worsening.

## 2011-04-11 ENCOUNTER — Emergency Department (HOSPITAL_BASED_OUTPATIENT_CLINIC_OR_DEPARTMENT_OTHER)
Admission: EM | Admit: 2011-04-11 | Discharge: 2011-04-11 | Disposition: A | Discharge status: Home / Self Care | Payer: BC Managed Care – PPO | Attending: Emergency Medicine | Admitting: Emergency Medicine

## 2011-04-11 ENCOUNTER — Encounter (HOSPITAL_BASED_OUTPATIENT_CLINIC_OR_DEPARTMENT_OTHER): Payer: Self-pay

## 2011-04-11 DIAGNOSIS — E669 Obesity, unspecified: Secondary | ICD-10-CM | POA: Insufficient documentation

## 2011-04-11 DIAGNOSIS — R509 Fever, unspecified: Secondary | ICD-10-CM | POA: Insufficient documentation

## 2011-04-11 DIAGNOSIS — B349 Viral infection, unspecified: Secondary | ICD-10-CM

## 2011-04-11 DIAGNOSIS — R112 Nausea with vomiting, unspecified: Secondary | ICD-10-CM | POA: Insufficient documentation

## 2011-04-11 DIAGNOSIS — B9789 Other viral agents as the cause of diseases classified elsewhere: Secondary | ICD-10-CM | POA: Insufficient documentation

## 2011-04-11 MED ORDER — ONDANSETRON HCL 4 MG/2ML IJ SOLN
4.0000 mg | Freq: Once | INTRAMUSCULAR | Status: AC
  Administered 2011-04-11: 4 mg via INTRAVENOUS
  Filled 2011-04-11: qty 2

## 2011-04-11 MED ORDER — KETOROLAC TROMETHAMINE 30 MG/ML IJ SOLN
30.0000 mg | Freq: Once | INTRAMUSCULAR | Status: AC
  Administered 2011-04-11: 30 mg via INTRAVENOUS
  Filled 2011-04-11: qty 1

## 2011-04-11 MED ORDER — IBUPROFEN 800 MG PO TABS: 800.0000 mg | ORAL_TABLET | Freq: Three times a day (TID) | ORAL | Status: AC

## 2011-04-11 MED ORDER — IBUPROFEN 800 MG PO TABS
ORAL_TABLET | ORAL | Status: AC
  Administered 2011-04-11: 800 mg via ORAL
  Filled 2011-04-11: qty 1

## 2011-04-11 MED ORDER — SODIUM CHLORIDE 0.9 % IV BOLUS (SEPSIS)
1000.0000 mL | Freq: Once | INTRAVENOUS | Status: AC
  Administered 2011-04-11: 1000 mL via INTRAVENOUS

## 2011-04-11 MED ORDER — IBUPROFEN 800 MG PO TABS
800.0000 mg | ORAL_TABLET | Freq: Once | ORAL | Status: AC
  Administered 2011-04-11: 800 mg via ORAL

## 2011-04-11 MED ORDER — ONDANSETRON HCL 4 MG PO TABS: 4.0000 mg | ORAL_TABLET | Freq: Four times a day (QID) | ORAL | Status: AC

## 2011-04-11 NOTE — ED Provider Notes (Signed)
History    This chart was scribed for Jacqueline Nielsen, MD, MD by [name]. The patient was seen in room [facility] and the patient's care was started at 10:50PM.   CSN: [id number] Arrival date & time: 04/11/2011 10:35 PM   First MD Initiated Contact with Patient 04/11/11 2244      No chief complaint on file.   (Consider location/radiation/quality/duration/timing/severity/associated sxs/prior treatment) The history is provided by the patient.   [name] is a 35 y.o. female who presents to the Emergency Department complaining o constant sore throat, nausea with vomiting and fever of 100.1 with generalized body aches onset 2 days ago. She has also had elevated heart rate. Pt reports taking Tylenol at home with minor relief. She reports she has sick contact at home. Pt denies diarrhea, rashes, and recent travel. She denies any medication allergies.   Past Medical History  Diagnosis Date  . Obesity   . Hx LEEP (loop electrosurgical excision procedure), cervix, pregnancy   . History of ectopic pregnancy   . Allergic rhinitis     Past Surgical History  Procedure Date  . Cesarean section      2007 & 2010  . Cholecystectomy     2004  . Laparoscopic salpingoopherectomy     resection of ectopic and evacutaion of hemoperitoneum    Family History  Problem Relation Age of Onset  . Bipolar disorder Mother   . Diabetes Mother   . Hypertension Father   . Peripheral vascular disease Maternal Grandmother     died during carotid surgery    History  Substance Use Topics  . Smoking status: Never Smoker   . Smokeless tobacco: Never Used  . Alcohol Use: No    OB History    Grav Para Term Preterm Abortions TAB SAB Ect Mult Living                  Review of Systems  All other systems reviewed and are negative.   10 Systems reviewed and are negative for acute change except as noted in the HPI.  Allergies  Review of patient's allergies indicates no known allergies.  Home  Medications   Current Outpatient Rx  Name Route Sig Dispense Refill  . ACETAMINOPHEN 500 MG PO TABS Oral Take 1,000 mg by mouth every 6 (six) hours as needed. For pain and fever     . IBUPROFEN 800 MG PO TABS Oral Take 800 mg by mouth once as needed. For pain     . GUMMI BEAR MULTIVITAMIN/MIN PO Oral Take 1 each by mouth daily.        BP 115/64  Pulse 139  Temp 100.1 F (37.8 C)  Resp 18  Ht 5\' 3"  (1.6 m)  Wt 225 lb (102.059 kg)  BMI 39.86 kg/m2  SpO2 100%  Physical Exam  Nursing note and vitals reviewed. Constitutional: She is oriented to person, place, and time. She appears well-developed and well-nourished. No distress.  HENT:  Head: Normocephalic and atraumatic.  Right Ear: External ear normal.  Left Ear: External ear normal.  Mouth/Throat: Oropharynx is clear and moist. No oropharyngeal exudate.  Eyes: EOM are normal. Pupils are equal, round, and reactive to light. No scleral icterus.  Neck: Normal range of motion. Neck supple. No tracheal deviation present.  Cardiovascular: Normal heart sounds.        Tachycardic   Pulmonary/Chest: Effort normal and breath sounds normal. No respiratory distress.  Abdominal: Soft. She exhibits no distension. There is no  tenderness. There is no rebound and no guarding.  Musculoskeletal: Normal range of motion. She exhibits no edema.  Lymphadenopathy:    She has no cervical adenopathy.  Neurological: She is alert and oriented to person, place, and time.  Skin: Skin is warm and dry.  Psychiatric: She has a normal mood and affect. Her behavior is normal.    ED Course  Procedures (including critical care time)  DIAGNOSTIC STUDIES: Oxygen Saturation is 100% on room air, normal by my interpretation.    COORDINATION OF CARE:   IVfs for tachycardia likely related to fever. zofran iV and toradol. Tolerates tylenol and water.   Recheck at 11:35pm feeling better and wishes to go home.  MDM  Flu like symptoms and treated for same. Plan  outpatient management with zofran, tylenol, and PO hydration. PCP follow up as needed.       I personally performed the services described in this documentation, which was scribed in my presence. The recorded information has been reviewed and considered.     Jacqueline Nielsen, MD 04/11/11 [phone]

## 2011-04-11 NOTE — ED Notes (Signed)
Pt states that she had sore throat initially , followed by vomiting, fever generalized aching

## 2012-07-12 ENCOUNTER — Ambulatory Visit (INDEPENDENT_AMBULATORY_CARE_PROVIDER_SITE_OTHER): Payer: BC Managed Care – PPO | Admitting: Family Medicine

## 2012-07-12 ENCOUNTER — Encounter: Payer: Self-pay | Admitting: Family Medicine

## 2012-07-12 VITALS — BP 127/80 | HR 87 | Temp 98.9°F | Resp 14 | Ht 63.0 in | Wt 226.8 lb

## 2012-07-12 DIAGNOSIS — M722 Plantar fascial fibromatosis: Secondary | ICD-10-CM | POA: Insufficient documentation

## 2012-07-12 HISTORY — DX: Plantar fascial fibromatosis: M72.2

## 2012-07-12 MED ORDER — DICLOFENAC SODIUM 75 MG PO TBEC: 75.0000 mg | DELAYED_RELEASE_TABLET | Freq: Two times a day (BID) | ORAL | Status: DC

## 2012-07-12 NOTE — Progress Notes (Signed)
Office Note 07/12/2012  CC:  Chief Complaint  Patient presents with  . Foot Pain    Left foot. NP Transfer from Dr. Artist Pais.    HPI:  [name] is a 37 y.o. White female who is here to transfer care from Dr. Artist Pais at [facility] in [place]. Old records in EPIC/HL EMR were reviewed prior to or during today's visit.  GYN office is [facility] (last yearly exam with them was 08/2011).  Onset a few months ago of left foot pain near base of heel, started shortly after getting a new pair of ballet flats. Hurts the most after she has had her foot tucked under her opposite thigh while sitting.  Worse after walking long distances.  First few steps in morning are fine.  Has never had this pain before.   The pain is altering her gait: walking on lateral area of foot more.  Not taking anything for the pain.  Past Medical History  Diagnosis Date  . Obesity   . Hx LEEP (loop electrosurgical excision procedure), cervix, pregnancy   . History of ectopic pregnancy   . Allergic rhinitis   . OBESITY 09/29/2007    Qualifier: Diagnosis of  By: Nena Jordan   . GOITER 09/29/2007    Qualifier: Diagnosis of  By: Terrilee Croak CMA, Darlene      Past Surgical History  Procedure Laterality Date  . Cesarean section       2007 & 2010  . 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 Laparoscopic salpingoopherectomy      resection of ectopic and evacutaion of hemoperitoneum  . Leep  1999&2000  . Tympanostomy tube placement  as a child    Family History  Problem Relation Age of Onset  . Bipolar disorder Mother   . Diabetes Mother   . Hypertension Father   . Peripheral vascular disease Maternal Grandmother     died during carotid surgery    History   Social History  . Marital Status: Married    Spouse Name: N/A    Number of Children: N/A  . Years of Education: N/A   Occupational History  . Not on file.   Social History Main Topics  . Smoking status: Never Smoker   .  Smokeless tobacco: Never Used  . Alcohol Use: No  . Drug Use: No  . Sexually Active: Not on file   Other Topics Concern  . Not on file   Social History Narrative   Married.   College graduate.   Homemaker.   Married 7 years   1 daughter   1 son   No T/A/Ds.               MEDS: none except occasional ibuprofen or tylenol  No Known Allergies  ROS Review of Systems  Constitutional: Negative for fever and fatigue.  HENT: Negative for congestion and sore throat.   Eyes: Negative for visual disturbance.  Respiratory: Negative for cough.   Cardiovascular: Negative for chest pain.  Gastrointestinal: Negative for nausea and abdominal pain.  Genitourinary: Negative for dysuria.  Musculoskeletal: Negative for myalgias, back pain, joint swelling and arthralgias.  Skin: Negative for rash.  Neurological: Negative for weakness and headaches.  Hematological: Negative for adenopathy.    PE; Blood pressure 127/80, pulse 87, temperature 98.9 F (37.2 C), temperature source Temporal, resp. rate 14, height 5\' 3"  (1.6 m), weight 226 lb 12 oz (102.853 kg), last menstrual  period 06/24/2012, SpO2 96.00%. LMP 06/24/12 Gen: Alert, well appearing.  Patient is oriented to person, place, time, and situation. ENT:   Eyes: no injection, icteris, swelling, or exudate.  EOMI, PERRLA. Nose: no drainage or turbinate edema/swelling.  No injection or focal lesion.  Mouth: lips without lesion/swelling.  Oral mucosa pink and moist.  Dentition intact and without obvious caries or gingival swelling.  Oropharynx without erythema, exudate, or swelling.  Neck - No masses or thyromegaly or limitation in range of motion CV: RRR, no m/r/g.   LUNGS: CTA bilat, nonlabored resps, good aeration in all lung fields. ABD: soft, NT/ND EXT: no clubbing, cyanosis, or edema.  FEET:  No deformity, erythema, or warmth.  Left calcaneus without tenderness except at the medial plantar surface where the plantar fascia attaches to  the calcaneus.  The remainder of the plantar fascia is nontender.  Remainder of the calcaneus nontender.  No achilles tendon tenderness.  Ankles w/out laxity.  DP pulses 1+ bilat.   Pertinent labs:  none  ASSESSMENT AND PLAN:   Transfer pt: reviewed history/old records in chart.  Plantar fasciitis of left foot Discussed dx, answered questions. Recommended conservative management with relative rest, post-op/hard-soled shoe most of every day, gentle stretching exercises, and NSAID therapy (diclofenac 75mg  bid x 2 wks, then bid prn).  Therapeutic expectations and side effect profile of medication discussed today.  Patient's questions answered.   An After Visit Summary was printed and given to the patient.  Spent 30 min with pt today, with >50% of this time spent in counseling and care coordination regarding the above problems.  Return for 3-4 wks o/v to f/u plantar fasciitis.

## 2012-07-12 NOTE — Assessment & Plan Note (Signed)
Discussed dx, answered questions. Recommended conservative management with relative rest, post-op/hard-soled shoe most of every day, gentle stretching exercises, and NSAID therapy (diclofenac 75mg  bid x 2 wks, then bid prn).  Therapeutic expectations and side effect profile of medication discussed today.  Patient's questions answered.

## 2012-08-03 ENCOUNTER — Ambulatory Visit: Payer: BC Managed Care – PPO | Admitting: Family Medicine

## 2012-08-09 ENCOUNTER — Ambulatory Visit: Payer: BC Managed Care – PPO | Admitting: Family Medicine

## 2012-08-10 ENCOUNTER — Encounter: Payer: Self-pay | Admitting: Family Medicine

## 2012-08-10 ENCOUNTER — Ambulatory Visit (INDEPENDENT_AMBULATORY_CARE_PROVIDER_SITE_OTHER): Payer: BC Managed Care – PPO | Admitting: Family Medicine

## 2012-08-10 VITALS — BP 110/70 | HR 56 | Temp 98.3°F | Ht 63.0 in | Wt 226.5 lb

## 2012-08-10 DIAGNOSIS — M722 Plantar fascial fibromatosis: Secondary | ICD-10-CM

## 2012-08-10 MED ORDER — METHYLPREDNISOLONE ACETATE 40 MG/ML IJ SUSP
150.0000 mg | Freq: Once | INTRAMUSCULAR | Status: AC
  Administered 2012-08-10: 150 mg via INTRAMUSCULAR

## 2012-08-10 MED ORDER — METHYLPREDNISOLONE ACETATE 40 MG/ML IJ SUSP: 40.0000 mg | Freq: Once | INTRAMUSCULAR | Status: DC

## 2012-08-10 NOTE — Progress Notes (Signed)
OFFICE NOTE  08/10/2012  CC:  Chief Complaint  Patient presents with  . Follow-up    Plantar Fasciitis     HPI: Patient is a 37 y.o. Caucasian female who is here for 1 mo f/u for plantar fasciitis. She essentially is no better.  She was feeling ok with flip flops but recent wore tennis shoes and did yard work and it hurt BAD after that. She is interested in a steroid injection today.  Pertinent PMH:  Past Medical History  Diagnosis Date  . Obesity   . Hx LEEP (loop electrosurgical excision procedure), cervix, pregnancy   . History of ectopic pregnancy   . Allergic rhinitis   . OBESITY 09/29/2007    Qualifier: Diagnosis of  By: Nena Jordan   . GOITER 09/29/2007    Qualifier: Diagnosis of  By: Terrilee Croak CMA, Darlene      MEDS:  Outpatient Prescriptions Prior to Visit  Medication Sig Dispense Refill  . acetaminophen (TYLENOL) 500 MG tablet Take 1,000 mg by mouth every 6 (six) hours as needed. For pain and fever       . diclofenac (VOLTAREN) 75 MG EC tablet Take 1 tablet (75 mg total) by mouth 2 (two) times daily.  60 tablet  0  . Pediatric Multivit-Minerals-C (GUMMI BEAR MULTIVITAMIN/MIN PO) Take 1 each by mouth daily.         No facility-administered medications prior to visit.    PE: Blood pressure 110/70, pulse 56, temperature 98.3 F (36.8 C), temperature source Oral, height 5\' 3"  (1.6 m), weight 226 lb 8 oz (102.74 kg), last menstrual period 06/30/2012, SpO2 97.00%. Gen: Alert, well appearing.  Patient is oriented to person, place, time, and situation. Left foot: heel is tender at the medial calcaneal tubercle focally with firm pressure applied with my thumb.   Otherwise, no tenderness of plantar surface of foot or of remainder of heel.  Achilles region and calf nontender.  IMPRESSION AND PLAN: Plantar fasciitis, failing conservative mgmt, pt desires steroid injection for this today. Procedure explained, questions answered, consent signed.  PROCEDURE: left plantar  fascia injection: cleaned/prepped area of most tenderness at medial calcaneal tubercle, used 1 inch 23   gauge needle to inject 1cc of 40mg /cc depo-medrol + 2 cc of 2% lidocaine w/out epi into the area of maximal tenderness from the medial approach with pt lying in supine position.  Injection went in with minimal resistance, pt tolerated procedure well, no immediate complications.  Encouraged relative rest for the next 1 wk, use 1/4 inch heel lift in shoe, resume diclofenac 75mg  bid prn in 1 wk and I gave handout of plantar fascia exercises for her to start in 1 wk.  FOLLOW UP: 44mo

## 2012-08-10 NOTE — Addendum Note (Signed)
Addended by: Marlene Lard on: 08/10/2012 12:01 PM   Modules accepted: Orders

## 2012-08-15 ENCOUNTER — Encounter: Payer: Self-pay | Admitting: Family Medicine

## 2012-08-15 ENCOUNTER — Ambulatory Visit (INDEPENDENT_AMBULATORY_CARE_PROVIDER_SITE_OTHER): Payer: BC Managed Care – PPO | Admitting: Family Medicine

## 2012-08-15 VITALS — BP 107/72 | HR 64 | Temp 98.0°F | Resp 16 | Wt 226.5 lb

## 2012-08-15 DIAGNOSIS — M79672 Pain in left foot: Secondary | ICD-10-CM

## 2012-08-15 DIAGNOSIS — M722 Plantar fascial fibromatosis: Secondary | ICD-10-CM

## 2012-08-15 DIAGNOSIS — M79609 Pain in unspecified limb: Secondary | ICD-10-CM

## 2012-08-15 MED ORDER — HYDROCODONE-ACETAMINOPHEN 5-325 MG PO TABS: ORAL_TABLET | ORAL | Status: DC

## 2012-08-15 NOTE — Progress Notes (Signed)
OFFICE NOTE  08/15/2012  CC:  Chief Complaint  Patient presents with  . Follow-up    Plantar Fascia Injection; discuss referral to podiatrist.     HPI: Patient is a 36 y.o. Caucasian female who is here for f/u plantar fasciitis. I injected her left foot 5d/a for this.  She had 24-36h of worse pain while resting, then for the subsequent 1-2 days she began to feel mild improvement and was walking around some.  She then had worsening of her pain and it has continued until today---hurts constantly now, even while not bearing weight. She does desire referral to specialist.  Pertinent PMH:  Past Medical History  Diagnosis Date  . Obesity   . Hx LEEP (loop electrosurgical excision procedure), cervix, pregnancy   . History of ectopic pregnancy   . Allergic rhinitis   . OBESITY 09/29/2007    Qualifier: Diagnosis of  By: Nena Jordan   . GOITER 09/29/2007    Qualifier: Diagnosis of  By: Terrilee Croak CMA, Darlene      MEDS:  Outpatient Prescriptions Prior to Visit  Medication Sig Dispense Refill  . acetaminophen (TYLENOL) 500 MG tablet Take 1,000 mg by mouth every 6 (six) hours as needed. For pain and fever       . diclofenac (VOLTAREN) 75 MG EC tablet Take 1 tablet (75 mg total) by mouth 2 (two) times daily.  60 tablet  0  . Pediatric Multivit-Minerals-C (GUMMI BEAR MULTIVITAMIN/MIN PO) Take 1 each by mouth daily.         No facility-administered medications prior to visit.    PE: Blood pressure 107/72, pulse 64, temperature 98 F (36.7 C), temperature source Oral, resp. rate 16, weight 226 lb 8 oz (102.74 kg), last menstrual period 07/31/2012, SpO2 98.00%. Gen: Alert, well appearing.  Patient is oriented to person, place, time, and situation. Left foot: tender to palpation at medial calcaneal tubercle with firm palpation, otherwise nontender.  No erythema. No warmth.  No swelling.   IMPRESSION AND PLAN:  Plantar fasciitis of left foot No improvement with recent steroid  injection Continue NSAIDs and exercises. Rest as much as possible. Refer to podiatry. Vicodin 5/325, 1-2 q6h prn pain, #40, no RF.   An After Visit Summary was printed and given to the patient.   FOLLOW UP: prn

## 2012-08-15 NOTE — Assessment & Plan Note (Signed)
No improvement with recent steroid injection Continue NSAIDs and exercises. Rest as much as possible. Refer to podiatry. Vicodin 5/325, 1-2 q6h prn pain, #40, no RF.

## 2012-10-16 ENCOUNTER — Ambulatory Visit: Payer: Self-pay | Admitting: Gynecology

## 2012-10-16 ENCOUNTER — Ambulatory Visit (INDEPENDENT_AMBULATORY_CARE_PROVIDER_SITE_OTHER): Payer: BC Managed Care – PPO | Admitting: Gynecology

## 2012-10-16 ENCOUNTER — Encounter: Payer: Self-pay | Admitting: Gynecology

## 2012-10-16 VITALS — BP 120/66 | HR 64 | Resp 16 | Ht 63.0 in | Wt 228.0 lb

## 2012-10-16 DIAGNOSIS — Z9889 Other specified postprocedural states: Secondary | ICD-10-CM | POA: Insufficient documentation

## 2012-10-16 DIAGNOSIS — Z01419 Encounter for gynecological examination (general) (routine) without abnormal findings: Secondary | ICD-10-CM

## 2012-10-16 DIAGNOSIS — Z124 Encounter for screening for malignant neoplasm of cervix: Secondary | ICD-10-CM

## 2012-10-16 NOTE — Patient Instructions (Addendum)

## 2012-10-16 NOTE — Progress Notes (Signed)
Patient ID: [name], female   DOB: 04/01/1976, 37 y.o.   MRN: [id number] 37 y.o. married Caucasian female   [phone] here for annual exam. Pt is currently sexually active.  Using condoms for contraception, unsure regarding another pregnancy. Pt recalls initial LEEP for CIN III?  Patient's last menstrual period was 10/03/2012.          Sexually active: yes  The current method of family planning is condoms most of the time.    Exercising: no  The patient does not participate in regular exercise at present. Last pap: 2012 Alcohol: None Tobacco: None BSE: not very often    Health Maintenance  Topic Date Due  . Influenza Vaccine  12/25/2012  . Pap Smear  09/06/2013  . Tetanus/tdap  09/28/2017    Family History  Problem Relation Age of Onset  . Bipolar disorder Mother   . Diabetes Mother   . Hypertension Father   . Peripheral vascular disease Maternal Grandmother     died during carotid surgery    Patient Active Problem List   Diagnosis Date Noted  . Plantar fasciitis of left foot 07/12/2012    Past Medical History  Diagnosis Date  . Obesity   . Hx LEEP (loop electrosurgical excision procedure), cervix, pregnancy   . History of ectopic pregnancy   . Allergic rhinitis   . OBESITY 09/29/2007    Qualifier: Diagnosis of  By: Nena Jordan   . GOITER 09/29/2007    Qualifier: Diagnosis of  By: Terrilee Croak CMA, Darlene      Past Surgical History  Procedure Laterality Date  . Cesarean section       2007 & 2010  . 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 Laparoscopic salpingoopherectomy      resection of ectopic and evacutaion of hemoperitoneum  . Leep  1999&2000  . Tympanostomy tube placement  as a child    Allergies: Review of patient's allergies indicates no known allergies.  Current Outpatient Prescriptions  Medication Sig Dispense Refill  . acetaminophen (TYLENOL) 500 MG tablet Take 1,000 mg by mouth every 6 (six) hours as needed. For  pain and fever       . diclofenac (VOLTAREN) 75 MG EC tablet Take 1 tablet (75 mg total) by mouth 2 (two) times daily.  60 tablet  0  . HYDROcodone-acetaminophen (NORCO/VICODIN) 5-325 MG per tablet 1-2 tabs po q6h prn pain  40 tablet  0  . Pediatric Multivit-Minerals-C (GUMMI BEAR MULTIVITAMIN/MIN PO) Take 1 each by mouth daily.         No current facility-administered medications for this visit.    ROS: Pertinent items are noted in HPI.  Exam:    BP 120/66  Pulse 64  Resp 16  Ht 5\' 3"  (1.6 m)  Wt 228 lb (103.42 kg)  BMI 40.4 kg/m2  LMP 10/03/2012 Weight change: @WEIGHTCHANGE @ Last 3 height recordings:  Ht Readings from Last 3 Encounters:  10/16/12 5\' 3"  (1.6 m)  08/10/12 5\' 3"  (1.6 m)  07/12/12 5\' 3"  (1.6 m)   General appearance: alert, cooperative and appears stated age Head: Normocephalic, without obvious abnormality, atraumatic Neck: no adenopathy, no carotid bruit, no JVD, supple, symmetrical, trachea midline and thyroid not enlarged, symmetric, no tenderness/mass/nodules Lungs: clear to auscultation bilaterally Breasts: normal appearance, no masses or tenderness Heart: regular rate and rhythm, S1, S2 normal, no murmur, click, rub or gallop Abdomen: soft, non-tender; bowel sounds normal; no masses,  no organomegaly Extremities: extremities normal, atraumatic, no cyanosis or edema Skin: Skin color, texture, turgor normal. No rashes, small area of folliculitis in c/s scar with associated erythema Lymph nodes: Cervical, supraclavicular, and axillary nodes normal. no inguinal nodes palpated Neurologic: Grossly normal   Pelvic: External genitalia:  no lesions              Urethra: normal appearing urethra with no masses, tenderness or lesions              Bartholins and Skenes: normal                 Vagina: normal appearing vagina with normal color and discharge, no lesions              Cervix: scarred, friable              Pap taken: yes        Bimanual Exam:  Uterus:   irregular, globular                                      Adnexa:    normal adnexa in size, nontender and no masses                                      Rectovaginal: Confirms                                      Anus:  normal sphincter tone, no lesions  A: well woman Condom use     P: pap smear for 20y-2019, guidelines reviewed s/p LEEP Happy with condoms for now Cervicitis treated with silver nitrate Will treat area of folliculitis with warm compress, bactitracin, trim,not shave hair return annually or prn   An After Visit Summary was printed and given to the patient.  ce

## 2012-10-18 LAB — IPS PAP SMEAR ONLY

## 2013-02-06 ENCOUNTER — Encounter: Payer: Self-pay | Admitting: Gynecology

## 2013-02-21 ENCOUNTER — Encounter: Payer: Self-pay | Admitting: Gynecology

## 2013-04-26 DIAGNOSIS — M26629 Arthralgia of temporomandibular joint, unspecified side: Secondary | ICD-10-CM

## 2013-04-26 HISTORY — DX: Arthralgia of temporomandibular joint, unspecified side: M26.629

## 2013-07-20 ENCOUNTER — Encounter: Payer: Self-pay | Admitting: Family Medicine

## 2013-07-20 ENCOUNTER — Ambulatory Visit (INDEPENDENT_AMBULATORY_CARE_PROVIDER_SITE_OTHER): Payer: BC Managed Care – PPO | Admitting: Family Medicine

## 2013-07-20 VITALS — BP 108/75 | HR 74 | Temp 98.8°F | Resp 16 | Ht 63.0 in | Wt 222.0 lb

## 2013-07-20 DIAGNOSIS — J189 Pneumonia, unspecified organism: Secondary | ICD-10-CM

## 2013-07-20 HISTORY — DX: Pneumonia, unspecified organism: J18.9

## 2013-07-20 MED ORDER — AZITHROMYCIN 250 MG PO TABS: ORAL_TABLET | ORAL | Status: DC

## 2013-07-20 NOTE — Patient Instructions (Signed)
Buy OTC med called [facility] and take as directed on packaging.

## 2013-07-20 NOTE — Progress Notes (Signed)
Pre visit review using our clinic review tool, if applicable. No additional management support is needed unless otherwise documented below in the visit note. 

## 2013-07-20 NOTE — Progress Notes (Signed)
OFFICE NOTE  07/20/2013  CC:  Chief Complaint  Patient presents with  . Fever    Started Sunday  . Chills  . Cough    x Tuesday.    . Shortness of Breath     HPI: Patient is a 38 y.o. Caucasian female who is here for cough. Onset of fever/chills 6 d/a --up to 102.5 with diffuse body aches, then a couple days later started with cough and rattly sound in chest, and left mid back pain (constant, worse when coughs).  No wheezing but has felt SOB/exhausted with walking. No nasal congestion or runny nose, no ST. No n/v but appetite is down.  No change in stool habits. Fevers and achiness have gone away.  Pertinent PMH:  Past medical, surgical, social, and family history reviewed and no changes are noted since last office visit.  MEDS:  Tylenol prn  PE: Blood pressure 108/75, pulse 74, temperature 98.8 F (37.1 C), temperature source Temporal, resp. rate 16, height 5\' 3"  (1.6 m), weight 222 lb (100.699 kg), SpO2 96.00%. Gen: Alert, well appearing.  Patient is oriented to person, place, time, and situation. ENT: Ears: EACs clear, normal epithelium.  TMs with good light reflex and landmarks bilaterally.  Eyes: no injection, icteris, swelling, or exudate.  EOMI, PERRLA. Nose: no drainage or turbinate edema/swelling.  No injection or focal lesion.  Mouth: lips without lesion/swelling.  Oral mucosa pink and moist.  Dentition intact and without obvious caries or gingival swelling.  Oropharynx without erythema, exudate, or swelling.  Neck - No masses or thyromegaly or limitation in range of motion CV: RRR, no m/r/g LUNGS: decreased aeration and insp crackles in the bottom 1/2 of left lung field posteriorly, otherwise lungs clear. Mild diminished aeration diffusely on exhalation but no wheezing and only minimal post-exhalation coughing. Nonlabored resps. EXT: no clubbing, cyanosis, or edema.    IMPRESSION AND PLAN:  CAP: start azithromycin x 5d. Mucinex DM recommended. Signs/symptoms  to call or return for were reviewed and pt expressed understanding.Marland Kitchen  An After Visit Summary was printed and given to the patient.  FOLLOW UP: 10d

## 2013-07-25 ENCOUNTER — Ambulatory Visit (INDEPENDENT_AMBULATORY_CARE_PROVIDER_SITE_OTHER): Payer: BC Managed Care – PPO | Admitting: Family Medicine

## 2013-07-25 ENCOUNTER — Ambulatory Visit (HOSPITAL_BASED_OUTPATIENT_CLINIC_OR_DEPARTMENT_OTHER)
Admission: RE | Admit: 2013-07-25 | Discharge: 2013-07-25 | Disposition: A | Discharge status: Home / Self Care | Payer: BC Managed Care – PPO | Source: Ambulatory Visit | Attending: Family Medicine | Admitting: Family Medicine

## 2013-07-25 ENCOUNTER — Encounter: Payer: Self-pay | Admitting: Family Medicine

## 2013-07-25 VITALS — BP 109/74 | HR 65 | Temp 97.7°F | Resp 18 | Ht 63.0 in | Wt 220.0 lb

## 2013-07-25 DIAGNOSIS — J189 Pneumonia, unspecified organism: Secondary | ICD-10-CM

## 2013-07-25 DIAGNOSIS — M546 Pain in thoracic spine: Secondary | ICD-10-CM | POA: Insufficient documentation

## 2013-07-25 DIAGNOSIS — M549 Dorsalgia, unspecified: Secondary | ICD-10-CM

## 2013-07-25 NOTE — Progress Notes (Signed)
OFFICE NOTE  07/25/2013  CC:  Chief Complaint  Patient presents with  . Follow-up    pneumonia  . Back Pain     HPI: Patient is a 38 y.o. Caucasian female who is here for ongoing pain in left mid back area, was intense yesterday and felt like a burn and ache, better today.  No med taken for pain. Tried to rest/lay flat on back and this helped some.  Cough made it hurt worse yesterday but not today.  Deep breaths have no effect on the pain. No fevers.  Cough still present but more rattly.  No SOB or wheezing.  Appetite still down--no change.   Today the pain is a bit more on right side of mid back. No radiation of the pain.  No rash. Finished Z-pack yesterday.  Pertinent PMH:  Past medical, surgical, social, and family history reviewed and no changes are noted since last office visit.  MEDS:  None currently  PE: Blood pressure 109/74, pulse 65, temperature 97.7 F (36.5 C), temperature source Oral, resp. rate 18, height 5\' 3"  (1.6 m), weight 220 lb (99.791 kg), SpO2 99.00%. Pt examined with CMA Jacklynn Ganong present. Gen: Alert, well appearing.  Patient is oriented to person, place, time, and situation. CV: RRR, no m/r/g Lungs: subtle left base inspiratory crackles, also mild e to a changes L base > R base. No wheezing or prolongation of exp phase.  Nonlabored resps. Back: no tenderness to palpation.  LABS: none  IMPRESSION AND PLAN:  CAP, improving some. Her back pain is suspicious for pleuritic involvement vs diaphragmatic irritation from infiltrate. Will check CXR today, r/o effusion. Ibuprofen 600mg  bid with food recommended. May take mucinex DM or robitussin DM for cough prn.  An After Visit Summary was printed and given to the patient.  FOLLOW UP: prn

## 2013-07-25 NOTE — Patient Instructions (Signed)
Take 600 mg ibuprofen twice daily with food for your back pain.

## 2013-10-18 ENCOUNTER — Ambulatory Visit: Payer: BC Managed Care – PPO | Admitting: Gynecology

## 2013-10-19 ENCOUNTER — Ambulatory Visit: Payer: BC Managed Care – PPO | Admitting: Gynecology

## 2013-10-30 ENCOUNTER — Ambulatory Visit (INDEPENDENT_AMBULATORY_CARE_PROVIDER_SITE_OTHER): Payer: BC Managed Care – PPO | Admitting: Gynecology

## 2013-10-30 ENCOUNTER — Encounter: Payer: Self-pay | Admitting: Gynecology

## 2013-10-30 VITALS — BP 104/68 | HR 70 | Resp 18 | Ht 63.25 in | Wt 224.0 lb

## 2013-10-30 DIAGNOSIS — Z0000 Encounter for general adult medical examination without abnormal findings: Secondary | ICD-10-CM

## 2013-10-30 DIAGNOSIS — N920 Excessive and frequent menstruation with regular cycle: Secondary | ICD-10-CM

## 2013-10-30 DIAGNOSIS — E049 Nontoxic goiter, unspecified: Secondary | ICD-10-CM

## 2013-10-30 DIAGNOSIS — Z124 Encounter for screening for malignant neoplasm of cervix: Secondary | ICD-10-CM

## 2013-10-30 DIAGNOSIS — Z01419 Encounter for gynecological examination (general) (routine) without abnormal findings: Secondary | ICD-10-CM

## 2013-10-30 LAB — HEMOGLOBIN, FINGERSTICK: Hemoglobin, fingerstick: 13.3 g/dL (ref 12.0–16.0)

## 2013-10-30 LAB — POCT URINALYSIS DIPSTICK
Leukocytes, UA: NEGATIVE
UROBILINOGEN UA: NEGATIVE
pH, UA: 5

## 2013-10-30 NOTE — Progress Notes (Signed)
38 y.o. Married Caucasian female   [phone] here for annual exam. Pt is currently sexually active.  Pt reports on and off right breast pruritis.  Pt reports local mole in area.  Cycles regular but heavy-super + with pad and changes every 2h, clots.  Happy with condoms.    Patient's last menstrual period was 10/30/2013.          Sexually active: Yes.    The current method of family planning is condoms most of the time.    Exercising: No.  The patient does not participate in regular exercise at present. Last pap:  10/16/12 Negative  Alcohol: no Tobacco: no BSE: no  Hgb: 13.3 ; Urine: Blood 2, Trace Ketones, Trace Protein    Health Maintenance  Topic Date Due  . Influenza Vaccine  11/24/2013  . Pap Smear  10/17/2015  . Tetanus/tdap  09/28/2017    Family History  Problem Relation Age of Onset  . Bipolar disorder Mother   . Diabetes Mother   . Hypertension Father   . Peripheral vascular disease Maternal Grandmother     died during carotid surgery    Patient Active Problem List   Diagnosis Date Noted  . CAP (community acquired pneumonia) 07/20/2013  . History of loop electrical excision procedure (LEEP) 10/16/2012  . Plantar fasciitis of left foot 07/12/2012    Past Medical History  Diagnosis Date  . Obesity   . History of ectopic pregnancy 2009    left, tube preserved  . Allergic rhinitis   . OBESITY 09/29/2007    Qualifier: Diagnosis of  By: Wynona Luna   . GOITER 09/29/2007    Qualifier: Diagnosis of  By: Danelle Earthly CMA, Darlene      Past Surgical History  Procedure Laterality Date  . Cesarean section       2007 & 2010  . 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 Leep  1999&2000  . Tympanostomy tube placement  as a child  . Laparoscopy for ectopic pregnancy  2009    salpingostomy    Allergies: Review of patient's allergies indicates no known allergies.  No current outpatient prescriptions on file.   No current facility-administered  medications for this visit.    ROS: Pertinent items are noted in HPI.  Exam:    BP 104/68  Pulse 70  Resp 18  Ht 5' 3.25" (1.607 m)  Wt 224 lb (101.606 kg)  BMI 39.34 kg/m2  LMP 10/30/2013 Weight change: @WEIGHTCHANGE @ Last 3 height recordings:  Ht Readings from Last 3 Encounters:  10/30/13 5' 3.25" (1.607 m)  07/25/13 5\' 3"  (1.6 m)  07/20/13 5\' 3"  (1.6 m)   General appearance: alert, cooperative and appears stated age Head: Normocephalic, without obvious abnormality, atraumatic Neck: no adenopathy, no carotid bruit, no JVD, supple, symmetrical, trachea midline and thyroid minimal enlarged, symmetric, no tenderness/mass/nodules Lungs: clear to auscultation bilaterally Breasts: normal appearance, no masses or tenderness Heart: regular rate and rhythm, S1, S2 normal, no murmur, click, rub or gallop Abdomen: soft, non-tender; bowel sounds normal; no masses,  no organomegaly Extremities: extremities normal, atraumatic, no cyanosis or edema Skin: Skin color, texture, turgor normal. No rashes or lesions Lymph nodes: Cervical, supraclavicular, and axillary nodes normal. no inguinal nodes palpated Neurologic: Grossly normal   Pelvic: External genitalia:  no lesions              Urethra: normal appearing urethra with no masses, tenderness or lesions  Bartholins and Skenes: Bartholin's, Urethra, Skene's normal                 Vagina: normal appearing vagina with normal color and discharge, no lesions              Cervix: normal appearance              Pap taken: Yes.          Bimanual Exam:  Uterus:  uterus is normal size, shape, consistency and nontender                                      Adnexa:    normal adnexa in size, nontender and no masses                                      Rectovaginal: Confirms                                      Anus:  normal sphincter tone, no lesions      1. Routine gynecological examination  pap smear counseled on breast self exam,  adequate intake of calcium and vitamin D, diet and exercise return annually or prn  Mother-in-law recently diagnosed with breast cancer, thinks it is second primary, first at 18, question genetic risks and affects on her daughter discussed.  Pt will see if genetic testing is indicated  2. Laboratory examination ordered as part of a routine general medical examination  - POCT urinalysis dipstick - Hemoglobin, fingerstick  3. Screening for cervical cancer H/o LEEP 1999, 2000, normal PAP since - PAP Smear Only (IPS)  4. Menorrhagia with regular cycle  - TSH  5. Goiter First diagnosed 2009, no follow up lab or scans - US Soft Tissue Head/Neck; Future - TSH   An After Visit Summary was printed and given to the patient.

## 2013-10-31 LAB — TSH: TSH: 1.139 u[IU]/mL (ref 0.350–4.500)

## 2013-11-01 LAB — IPS PAP SMEAR ONLY

## 2014-01-08 ENCOUNTER — Encounter: Payer: Self-pay | Admitting: Family Medicine

## 2014-02-25 ENCOUNTER — Encounter: Payer: Self-pay | Admitting: Family Medicine

## 2014-03-27 ENCOUNTER — Ambulatory Visit (INDEPENDENT_AMBULATORY_CARE_PROVIDER_SITE_OTHER): Payer: BC Managed Care – PPO | Admitting: Nurse Practitioner

## 2014-03-27 ENCOUNTER — Encounter: Payer: Self-pay | Admitting: Nurse Practitioner

## 2014-03-27 VITALS — BP 105/72 | HR 66 | Temp 98.3°F | Resp 18 | Ht 63.25 in | Wt 225.0 lb

## 2014-03-27 DIAGNOSIS — J069 Acute upper respiratory infection, unspecified: Secondary | ICD-10-CM

## 2014-03-27 MED ORDER — GUAIFENESIN ER 600 MG PO TB12: 600.0000 mg | ORAL_TABLET | Freq: Two times a day (BID) | ORAL | Status: DC

## 2014-03-27 NOTE — Progress Notes (Signed)
   Subjective:    Patient ID: [name], female    DOB: [birth year], 38 y.o.   MRN: [id number]  HPI Comments: Pt gives Hx of several weeks of illness: 3 wks ago had cough & congestion. Symptoms resolved. Last week she had 6 days diarrhea that resolved on Sunday. Sunday, she developed nasal congestion & cough w/thick mucous. She denies fatigue or fever.  Cough This is a recurrent problem. The current episode started in the past 7 days (4d). The problem has been unchanged. The problem occurs hourly. The cough is productive of sputum. Associated symptoms include headaches and postnasal drip. Pertinent negatives include no chest pain, chills, ear congestion, fever, myalgias, sore throat, shortness of breath or wheezing. She has tried nothing for the symptoms. prob list indicates pneumonia last spring, but CXR clear      Review of Systems  Constitutional: Negative for fever, chills, activity change, appetite change and fatigue.  HENT: Positive for congestion (chest), postnasal drip, sinus pressure and voice change. Negative for sore throat.   Respiratory: Positive for cough. Negative for chest tightness, shortness of breath and wheezing.   Cardiovascular: Negative for chest pain.  Gastrointestinal: Negative for nausea, abdominal pain and diarrhea.  Musculoskeletal: Negative for myalgias and back pain.  Neurological: Positive for headaches.       Objective:   Physical Exam  Constitutional: She is oriented to person, place, and time. She appears well-developed and well-nourished. No distress.  HENT:  Head: Normocephalic and atraumatic.  Right Ear: External ear normal.  Left Ear: External ear normal.  Mouth/Throat: Oropharynx is clear and moist. No oropharyngeal exudate.  Clear fluid LTM, bones visible. Nml R TM. Voice is hoarse.  Eyes: Conjunctivae are normal. Right eye exhibits no discharge. Left eye exhibits no discharge.  Neck: Normal range of motion. Neck supple. No thyromegaly  present.  Cardiovascular: Normal rate, regular rhythm and normal heart sounds.   No murmur heard. Pulmonary/Chest: Effort normal and breath sounds normal. No respiratory distress. She has no wheezes. She has no rales.  Lymphadenopathy:    She has no cervical adenopathy.  Neurological: She is alert and oriented to person, place, and time.  Skin: Skin is warm and dry.  Psychiatric: She has a normal mood and affect. Her behavior is normal. Thought content normal.  Vitals reviewed.         Assessment & Plan:  1. Upper respiratory infection with cough and congestion Likely viral - guaiFENesin (MUCINEX) 600 MG 12 hr tablet; Take 1 tablet (600 mg total) by mouth 2 (two) times daily.  Dispense: 30 tablet; Refill: 0  Symptom mangement. See patient instructions for complete plan. F/u PRN fever or chest pain

## 2014-03-27 NOTE — Patient Instructions (Signed)
You have a virus causing your symptoms. The average duration of symptoms is 14 days. Start daily sinus rinses (neilmed Sinus Rinse). Use ibuprophen for headache/face pain/tooth pain.  Take mucinex twice daily. Sip fluids every hour. Rest. If you are not feeling better in 10 days or develop fever or chest pain, call us for re-evaluation. Feel better!  Upper Respiratory Infection, Adult An upper respiratory infection (URI) is also sometimes known as the common cold. The upper respiratory tract includes the nose, sinuses, throat, trachea, and bronchi. Bronchi are the airways leading to the lungs. Most people improve within 1 week, but symptoms can last up to 2 weeks. A residual cough may last even longer.  CAUSES Many different viruses can infect the tissues lining the upper respiratory tract. The tissues become irritated and inflamed and often become very moist. Mucus production is also common. A cold is contagious. You can easily spread the virus to others by oral contact. This includes kissing, sharing a glass, coughing, or sneezing. Touching your mouth or nose and then touching a surface, which is then touched by another person, can also spread the virus. SYMPTOMS  Symptoms typically develop 1 to 3 days after you come in contact with a cold virus. Symptoms vary from person to person. They may include:  Runny nose.  Sneezing.  Nasal congestion.  Sinus irritation.  Sore throat.  Loss of voice (laryngitis).  Cough.  Fatigue.  Muscle aches.  Loss of appetite.  Headache.  Low-grade fever. DIAGNOSIS  You might diagnose your own cold based on familiar symptoms, since most people get a cold 2 to 3 times a year. Your caregiver can confirm this based on your exam. Most importantly, your caregiver can check that your symptoms are not due to another disease such as strep throat, sinusitis, pneumonia, asthma, or epiglottitis. Blood tests, throat tests, and X-rays are not necessary to diagnose a  common cold, but they may sometimes be helpful in excluding other more serious diseases. Your caregiver will decide if any further tests are required. RISKS AND COMPLICATIONS  You may be at risk for a more severe case of the common cold if you smoke cigarettes, have chronic heart disease (such as heart failure) or lung disease (such as asthma), or if you have a weakened immune system. The very young and very old are also at risk for more serious infections. Bacterial sinusitis, middle ear infections, and bacterial pneumonia can complicate the common cold. The common cold can worsen asthma and chronic obstructive pulmonary disease (COPD). Sometimes, these complications can require emergency medical care and may be life-threatening. PREVENTION  The best way to protect against getting a cold is to practice good hygiene. Avoid oral or hand contact with people with cold symptoms. Wash your hands often if contact occurs. There is no clear evidence that vitamin C, vitamin E, echinacea, or exercise reduces the chance of developing a cold. However, it is always recommended to get plenty of rest and practice good nutrition. TREATMENT  Treatment is directed at relieving symptoms. There is no cure. Antibiotics are not effective, because the infection is caused by a virus, not by bacteria. Treatment may include:  Increased fluid intake. Sports drinks offer valuable electrolytes, sugars, and fluids.  Breathing heated mist or steam (vaporizer or shower).  Eating chicken soup or other clear broths, and maintaining good nutrition.  Getting plenty of rest.  Using gargles or lozenges for comfort.  Controlling fevers with ibuprofen or acetaminophen as directed by your caregiver.  Increasing usage of your inhaler if you have asthma. Zinc gel and zinc lozenges, taken in the first 24 hours of the common cold, can shorten the duration and lessen the severity of symptoms. Pain medicines may help with fever, muscle  aches, and throat pain. A variety of non-prescription medicines are available to treat congestion and runny nose. Your caregiver can make recommendations and may suggest nasal or lung inhalers for other symptoms.  HOME CARE INSTRUCTIONS   Only take over-the-counter or prescription medicines for pain, discomfort, or fever as directed by your caregiver.  Use a warm mist humidifier or inhale steam from a shower to increase air moisture. This may keep secretions moist and make it easier to breathe.  Drink enough water and fluids to keep your urine clear or pale yellow.  Rest as needed.  Return to work when your temperature has returned to normal or as your caregiver advises. You may need to stay home longer to avoid infecting others. You can also use a face mask and careful hand washing to prevent spread of the virus. SEEK MEDICAL CARE IF:   After the first few days, you feel you are getting worse rather than better.  You need your caregiver's advice about medicines to control symptoms.  You develop chills, worsening shortness of breath, or brown or red sputum. These may be signs of pneumonia.  You develop yellow or brown nasal discharge or pain in the face, especially when you bend forward. These may be signs of sinusitis.  You develop a fever, swollen neck glands, pain with swallowing, or white areas in the back of your throat. These may be signs of strep throat. SEEK IMMEDIATE MEDICAL CARE IF:   You have a fever.  You develop severe or persistent headache, ear pain, sinus pain, or chest pain.  You develop wheezing, a prolonged cough, cough up blood, or have a change in your usual mucus (if you have chronic lung disease).  You develop sore muscles or a stiff neck. Document Released: 10/06/2000 Document Revised: 07/05/2011 Document Reviewed: 08/14/2010 [facility] Patient Information 2014 [place], [place].

## 2014-03-27 NOTE — Progress Notes (Signed)
Pre visit review using our clinic review tool, if applicable. No additional management support is needed unless otherwise documented below in the visit note. 

## 2014-10-07 ENCOUNTER — Ambulatory Visit (INDEPENDENT_AMBULATORY_CARE_PROVIDER_SITE_OTHER): Payer: BLUE CROSS/BLUE SHIELD | Admitting: Family Medicine

## 2014-10-07 ENCOUNTER — Encounter: Payer: Self-pay | Admitting: Family Medicine

## 2014-10-07 VITALS — BP 110/60 | HR 76 | Temp 98.6°F | Resp 16 | Wt 216.0 lb

## 2014-10-07 DIAGNOSIS — F4323 Adjustment disorder with mixed anxiety and depressed mood: Secondary | ICD-10-CM

## 2014-10-07 DIAGNOSIS — M7062 Trochanteric bursitis, left hip: Secondary | ICD-10-CM | POA: Diagnosis not present

## 2014-10-07 DIAGNOSIS — R197 Diarrhea, unspecified: Secondary | ICD-10-CM

## 2014-10-07 LAB — CBC
HCT: 39.1 % (ref 36.0–46.0)
Hemoglobin: 12.9 g/dL (ref 12.0–15.0)
MCHC: 33.1 g/dL (ref 30.0–36.0)
MCV: 82.5 fl (ref 78.0–100.0)
Platelets: 255 10*3/uL (ref 150.0–400.0)
RBC: 4.73 Mil/uL (ref 3.87–5.11)
RDW: 14.8 % (ref 11.5–15.5)
WBC: 8.1 10*3/uL (ref 4.0–10.5)

## 2014-10-07 LAB — COMPREHENSIVE METABOLIC PANEL
ALBUMIN: 4.1 g/dL (ref 3.5–5.2)
ALK PHOS: 57 U/L (ref 39–117)
ALT: 11 U/L (ref 0–35)
AST: 16 U/L (ref 0–37)
BUN: 13 mg/dL (ref 6–23)
CO2: 28 meq/L (ref 19–32)
Calcium: 9.2 mg/dL (ref 8.4–10.5)
Chloride: 105 mEq/L (ref 96–112)
Creatinine, Ser: 0.9 mg/dL (ref 0.40–1.20)
GFR: 75.02 mL/min (ref >60.00)
GLUCOSE: 113 mg/dL — AB (ref 70–99)
POTASSIUM: 4.5 meq/L (ref 3.5–5.1)
Sodium: 138 mEq/L (ref 135–145)
TOTAL PROTEIN: 6.4 g/dL (ref 6.0–8.3)
Total Bilirubin: 0.5 mg/dL (ref 0.2–1.2)

## 2014-10-07 MED ORDER — CHOLESTYRAMINE 4 GM/DOSE PO POWD
ORAL | Status: DC
Start: 2014-10-07 — End: 2014-11-04

## 2014-10-07 NOTE — Progress Notes (Signed)
Pre visit review using our clinic review tool, if applicable. No additional management support is needed unless otherwise documented below in the visit note. 

## 2014-10-07 NOTE — Progress Notes (Signed)
OFFICE VISIT  10/07/2014   CC:  Chief Complaint  Patient presents with  . Diarrhea    x 5 years   HPI:    Patient is a 39 y.o. Caucasian female who presents for stomach issues. Says since getting GB out in 2003 (removed for upper abd discomfort and GB dysfunction, but no stones.  These sx's resolved after cholecystectomy, and she had no recurrent diarrhea problem prior to cholecystectomy) almost always only after eating her 1st food of the day.  Any type of food could be ingested.  Says her sx's are light abd cramping, has watery postprandial BM that is urgent, then the remainder of her day she can eat and drink and has no abd pains or BMs. A bit of variance (better or worse) closer to her menses.    Mom with "stomach issues" but pt has little other helpful info about this.  Appetite is good, energy level is good. No blood or pus in stool. Diet: she has cut out lots of her simple carbs a few months ago and this did not help. Pt says no matter what she eats as the first food of her day, she gets these sx's.  She begins work this fall and wants to have this problem addressed/controlled by that time.  Pt also c/o L hip hurting since she had a problem with her heel in the recent past.  Heel pain resolved with treatment but since she altered her gait due to the heel pain, her hip pain is ongoing.  Hurts the most in the morning when first getting out of bed and moving around, admits she sleeps on that side a lot.  After she gets things stretched out and her day goes on, walking does not elicit hip pain.  No hx of hip trauma.  No fevers, no other joint complaints.    Finally, at the end of today's visit she says she is having substantial marriage problems and asks for referral to a counselor.  She does not want actual marriage/couples counseling at this time.  Says she just wants to discuss some anxiety/depression feelings she has regarding this problem in her life, and possibly will pursue  marriage counseling in the future.  Past Medical History  Diagnosis Date  . Obesity   . History of ectopic pregnancy 2009    left, tube preserved  . Allergic rhinitis   . OBESITY 09/29/2007    Qualifier: Diagnosis of  By: Wynona Luna   . GOITER 09/29/2007    Qualifier: Diagnosis of  By: Danelle Earthly CMA, Darlene    . TMJ pain dysfunction syndrome 2015    (causing otalgia/referred pain)-ENT is Dr. Redmond Baseman    Past Surgical History  Procedure Laterality Date  . Cesarean section       2007 & 2010  . 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 Leep  1999&2000  . Tympanostomy tube placement  as a child  . Laparoscopy for ectopic pregnancy  2009    salpingostomy   MEDS: none  No Known Allergies  ROS As per HPI  PE: Blood pressure 110/60, pulse 76, temperature 98.6 F (37 C), temperature source Oral, resp. rate 16, weight 216 lb (97.977 kg), SpO2 98 %. Gen: Alert, well appearing.  Patient is oriented to person, place, time, and situation. AFFECT: pleasant, lucid thought and speech. XBM:WUXL: no injection, icteris, swelling, or exudate.  EOMI, PERRLA. Mouth: lips without lesion/swelling.  Oral mucosa  pink and moist. Oropharynx without erythema, exudate, or swelling.  Neck - No masses or thyromegaly or limitation in range of motion CV: RRR, no m/r/g.   LUNGS: CTA bilat, nonlabored resps, good aeration in all lung fields. ABD: soft, ND, BS normal.  No HSM or mass or bruit.  Mild TTP in subxyphoid region, LUQ, and LLQ and suprapubic regions, without guarding or rebound. EXT: no clubbing, cyanosis, or edema.  Mild/mod TTP over L greater trochanter region.  Mild pain with active hip extension/flexion.  ER and IR of hip do not elicit pain.  SI joint and glut region w/out TTP.  LABS:  None today  IMPRESSION AND PLAN:  1) Postprandial GI upset/diarrhea: pt adamant that this started after her cholecystectomy.  Will do trial of cholestyramine 4g/pack, 1/2-1 pack  bid.  She may need to experiment a bit with the dosing and timing of her dosing. Check CBC w/diff and CMET today. If not responsive to this, will ask GI to see her.  I don't think she has IBS.  2) Left trochanteric bursitis; discussed activity mod, NSAIDs, ice. PT is an option if this is not better with this conservative mgmt in the next 3-4 wks.   She says injection is a last resort.  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  She is considering this current counseling eval/management as a possible precurser to marriage counseling in the future.  I ordered referral to Doroteo Glassman, PhD, to see if she could help with this.  An After Visit Summary was printed and given to the patient.  FOLLOW UP: Return in about 4 weeks (around 11/04/2014) for f/u GI probs.

## 2014-11-04 ENCOUNTER — Ambulatory Visit (INDEPENDENT_AMBULATORY_CARE_PROVIDER_SITE_OTHER): Payer: BLUE CROSS/BLUE SHIELD | Admitting: Family Medicine

## 2014-11-04 ENCOUNTER — Encounter: Payer: Self-pay | Admitting: Family Medicine

## 2014-11-04 VITALS — BP 104/69 | HR 85 | Temp 97.9°F | Ht 63.25 in | Wt 215.8 lb

## 2014-11-04 DIAGNOSIS — K529 Noninfective gastroenteritis and colitis, unspecified: Secondary | ICD-10-CM | POA: Diagnosis not present

## 2014-11-04 DIAGNOSIS — M25552 Pain in left hip: Secondary | ICD-10-CM | POA: Diagnosis not present

## 2014-11-04 MED ORDER — COLESEVELAM HCL 625 MG PO TABS: ORAL_TABLET | ORAL | Status: DC

## 2014-11-04 NOTE — Progress Notes (Signed)
OFFICE NOTE  11/04/2014  CC:  Chief Complaint  Patient presents with  . Follow-up    stopped med after first day.     HPI: Patient is a 39 y.o. Caucasian female who is here for 1 mo f/u chronic postprandial diarrhea. Cholestyramine rx'd last visit: she took one dose and it was too disgusting to take anymore. She denies any new sx's.  Labs last visit all normal.  Left hip still bothering her a lot.  More anteriorly now, near ASIS.  Hurts to lie on it.  Walking is fine. Worked outside a lot last evening and it hurts NONE today.  She will consider PT and if no improvement in 73mo she will let me know.  ROS: no hematochezia, no melena, no abd pain, no nausea/vomiting, no fevers, no abnormal wt loss.  Pertinent PMH:  Past Medical History  Diagnosis Date  . History of ectopic pregnancy 2009    left, tube preserved  . Allergic rhinitis   . OBESITY 09/29/2007    Qualifier: Diagnosis of  By: Wynona Luna   . GOITER 09/29/2007    Qualifier: Diagnosis of  By: Danelle Earthly CMA, Darlene    . TMJ pain dysfunction syndrome 2015    (causing otalgia/referred pain)-ENT is Dr. Redmond Baseman   Past Surgical History  Procedure Laterality Date  . Cesarean section       2007 & 2010  . 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 Leep  1999&2000  . Tympanostomy tube placement  as a child  . Laparoscopy for ectopic pregnancy  2009    salpingostomy   History   Social History Narrative   Married.   College graduate.   Homemaker.   Married 8 years   1 daughter   1 son   No T/A/Ds.                   MEDS:  NONE  PE: Blood pressure 104/69, pulse 85, temperature 97.9 F (36.6 C), temperature source Oral, height 5' 3.25" (1.607 m), weight 215 lb 12.8 oz (97.886 kg), SpO2 97 %. Gen: Alert, well appearing.  Patient is oriented to person, place, time, and situation. LB and glut region w/out TTP. Mild TTP over L iliac crest and superior aspect of iliac bone.  No anterior hip  tenderness.  Some mild tenderness inferiorly on left lateral hip region near greater troch but directly over greater troch she is nontender. L Leg flexion/extension/ER/IR all normal and w/out pain (active and passive).    LABS:  Lab Results  Component Value Date   TSH 1.139 10/30/2013   Lab Results  Component Value Date   WBC 8.1 10/07/2014   HGB 12.9 10/07/2014   HCT 39.1 10/07/2014   MCV 82.5 10/07/2014   PLT 255.0 10/07/2014   Lab Results  Component Value Date   CREATININE 0.90 10/07/2014   BUN 13 10/07/2014   NA 138 10/07/2014   K 4.5 10/07/2014   CL 105 10/07/2014   CO2 28 10/07/2014   Lab Results  Component Value Date   ALT 11 10/07/2014   AST 16 10/07/2014   ALKPHOS 57 10/07/2014   BILITOT 0.5 10/07/2014   Lab Results  Component Value Date   CHOL 183 08/01/2009   Lab Results  Component Value Date   HDL 58.40 08/01/2009   Lab Results  Component Value Date   LDLCALC 116* 08/01/2009   Lab Results  Component Value Date  TRIG 45.0 08/01/2009   Lab Results  Component Value Date   CHOLHDL 3 08/01/2009     IMPRESSION AND PLAN:  1) Post cholecystectomy diarrhea syndrome: morning meal only.  Pt couldn't tolerate taking the cholestyramine powder. She is willing to try the welchol caps, even if she can only afford it for a month, in order to see if she can get some benefit from this type of med. Only option left is GI referral if this doesn't work out.  2) Left hip pain; muscular pain, reassured.  She will tell me in 1 mo whether she wants PT referral for this or not. She is averse to taking any NSAIDs or tylenol for this, wants to just live with it.  An After Visit Summary was printed and given to the patient.  FOLLOW UP: prn

## 2014-12-19 ENCOUNTER — Encounter: Payer: Self-pay | Admitting: Gastroenterology

## 2015-01-06 ENCOUNTER — Encounter: Payer: Self-pay | Admitting: Family Medicine

## 2015-01-06 ENCOUNTER — Ambulatory Visit (INDEPENDENT_AMBULATORY_CARE_PROVIDER_SITE_OTHER): Payer: No Typology Code available for payment source | Admitting: Family Medicine

## 2015-01-06 VITALS — BP 100/69 | HR 73 | Temp 98.5°F | Resp 16 | Ht 63.25 in | Wt 215.0 lb

## 2015-01-06 DIAGNOSIS — Z0000 Encounter for general adult medical examination without abnormal findings: Secondary | ICD-10-CM | POA: Diagnosis not present

## 2015-01-06 DIAGNOSIS — Z111 Encounter for screening for respiratory tuberculosis: Secondary | ICD-10-CM

## 2015-01-06 DIAGNOSIS — E049 Nontoxic goiter, unspecified: Secondary | ICD-10-CM | POA: Diagnosis not present

## 2015-01-06 DIAGNOSIS — Z23 Encounter for immunization: Secondary | ICD-10-CM | POA: Diagnosis not present

## 2015-01-06 NOTE — Progress Notes (Signed)
Pre visit review using our clinic review tool, if applicable. No additional management support is needed unless otherwise documented below in the visit note. 

## 2015-01-06 NOTE — Progress Notes (Signed)
Office Note 01/06/2015  CC:  Chief Complaint  Patient presents with  . Annual Exam    started new job needs physical and TB skin test   HPI:  [name] is a 39 y.o. White female who is here for annual CPE. Got new job at [employer].   Pap utd (GYN).  Needs TB skin test.  She is not fasting but is due for FLP and TSH, which she wants to return for lab visit in near future.   Of note, she never got the welchol we had decided to start for her postprandial diarrhea problem--pharmacy had to order it and pt then never followed up.  Says her problem is "a little" better lately, doesn't want to try any new treatment or see GI for this problem at this time.  She is interested in TSH recheck b/c "hair falling out"   Past Medical History  Diagnosis Date  . History of ectopic pregnancy 2009    left, tube preserved  . Allergic rhinitis   . OBESITY 09/29/2007    Qualifier: Diagnosis of  By: Wynona Luna   . GOITER 09/29/2007    Qualifier: Diagnosis of  By: Danelle Earthly CMA, Darlene    . TMJ pain dysfunction syndrome 2015    (causing otalgia/referred pain)-ENT is Dr. Redmond Baseman    Past Surgical History  Procedure Laterality Date  . Cesarean section       2007 & 2010  . 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 Leep  1999&2000  . Tympanostomy tube placement  as a child  . Laparoscopy for ectopic pregnancy  2009    salpingostomy    Family History  Problem Relation Age of Onset  . Bipolar disorder Mother   . Diabetes Mother   . Hypertension Father   . Peripheral vascular disease Maternal Grandmother     died during carotid surgery    Social History   Social History  . Marital Status: Married    Spouse Name: N/A  . Number of Children: 2  . Years of Education: N/A   Occupational History  .     Social History Main Topics  . Smoking status: Never Smoker   . Smokeless tobacco: Never Used  . Alcohol Use: No  . Drug Use: No  . Sexual  Activity:    Partners: Male    Birth Control/ Protection: Condom   Other Topics Concern  . Not on file   Social History Narrative   Married.   College graduate.   Homemaker.   Married 8 years   1 daughter   1 son   No T/A/Ds.                   Outpatient Prescriptions Prior to Visit  Medication Sig Dispense Refill  . colesevelam (WELCHOL) 625 MG tablet 3 tabs po qd prior to first meal of the day (Patient not taking: Reported on 01/06/2015) 90 tablet 3   No facility-administered medications prior to visit.    No Known Allergies  ROS Review of Systems  Constitutional: Negative for fever, chills, appetite change and fatigue.  HENT: Negative for congestion, dental problem, ear pain and sore throat.   Eyes: Negative for discharge, redness and visual disturbance.  Respiratory: Negative for cough, chest tightness, shortness of breath and wheezing.   Cardiovascular: Negative for chest pain, palpitations and leg swelling.  Gastrointestinal: Negative for nausea, vomiting, abdominal pain, diarrhea and  blood in stool.  Genitourinary: Negative for dysuria, urgency, frequency, hematuria, flank pain and difficulty urinating.  Musculoskeletal: Negative for myalgias, back pain, joint swelling, arthralgias and neck stiffness.  Skin: Negative for pallor and rash.  Neurological: Negative for dizziness, speech difficulty, weakness and headaches.  Hematological: Negative for adenopathy. Does not bruise/bleed easily.  Psychiatric/Behavioral: Negative for confusion and sleep disturbance. The patient is not nervous/anxious.     PE; Blood pressure 100/69, pulse 73, temperature 98.5 F (36.9 C), temperature source Oral, resp. rate 16, height 5' 3.25" (1.607 m), weight 215 lb (97.523 kg), SpO2 96 %. Gen: Alert, well appearing.  Patient is oriented to person, place, time, and situation. AFFECT: pleasant, lucid thought and speech. ENT: Ears: EACs clear, normal epithelium.  TMs with good light  reflex and landmarks bilaterally.  Eyes: no injection, icteris, swelling, or exudate.  EOMI, PERRLA. Nose: no drainage or turbinate edema/swelling.  No injection or focal lesion.  Mouth: lips without lesion/swelling.  Oral mucosa pink and moist.  Dentition intact and without obvious caries or gingival swelling.  Oropharynx without erythema, exudate, or swelling.  Neck: supple/nontender.  No LAD, mass, or TM.  Carotid pulses 2+ bilaterally, without bruits. CV: RRR, no m/r/g.   LUNGS: CTA bilat, nonlabored resps, good aeration in all lung fields. ABD: soft, NT, ND, BS normal.  No hepatospenomegaly or mass.  No bruits. EXT: no clubbing, cyanosis, or edema.  Musculoskeletal: no joint swelling, erythema, warmth, or tenderness.  ROM of all joints intact. Skin - no sores or suspicious lesions or rashes or color changes  Pertinent labs:  Lab Results  Component Value Date   TSH 1.139 10/30/2013   Lab Results  Component Value Date   WBC 8.1 10/07/2014   HGB 12.9 10/07/2014   HCT 39.1 10/07/2014   MCV 82.5 10/07/2014   PLT 255.0 10/07/2014   Lab Results  Component Value Date   CREATININE 0.90 10/07/2014   BUN 13 10/07/2014   NA 138 10/07/2014   K 4.5 10/07/2014   CL 105 10/07/2014   CO2 28 10/07/2014   Lab Results  Component Value Date   ALT 11 10/07/2014   AST 16 10/07/2014   ALKPHOS 57 10/07/2014   BILITOT 0.5 10/07/2014   Lab Results  Component Value Date   CHOL 183 08/01/2009   Lab Results  Component Value Date   HDL 58.40 08/01/2009   Lab Results  Component Value Date   LDLCALC 116* 08/01/2009   Lab Results  Component Value Date   TRIG 45.0 08/01/2009   Lab Results  Component Value Date   CHOLHDL 3 08/01/2009   ASSESSMENT AND PLAN:   Health maintenance exam: doing well. Reviewed age and gender appropriate health maintenance issues (prudent diet, regular exercise, health risks of tobacco and excessive alcohol, use of seatbelts, fire alarms in home, use of  sunscreen).  Also reviewed age and gender appropriate health screening as well as vaccine recommendations. Flu vaccine given today. TB skin test placed today--return in 48h for recheck. Return for fasting labs at her earliest convenience: FLP and TSH. Postprandial diarrhea: watchful waiting.  An After Visit Summary was printed and given to the patient.  FOLLOW UP:  Return for fasting lab visit at pt's convenience; next office visit "as needed".

## 2015-01-08 ENCOUNTER — Ambulatory Visit: Payer: No Typology Code available for payment source

## 2015-01-08 LAB — TB SKIN TEST
Induration: 0 mm
TB Skin Test: NEGATIVE

## 2015-01-23 ENCOUNTER — Telehealth: Payer: Self-pay | Admitting: *Deleted

## 2015-01-23 NOTE — Telephone Encounter (Signed)
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 HGSIL/CIN-III per patient report at new patient appointment in 2014. 10/16/12 Pap, Negative  10/30/13 Pap, Negative  Previous patient of Dr. Charlies Constable.  Patient has not scheduled AEX with our office.  Please call pt to schedule AEX.  Thank you.

## 2015-01-23 NOTE — Telephone Encounter (Signed)
01-23-15. [place]. Patient needs AEX -eh

## 2015-01-28 NOTE — Telephone Encounter (Signed)
01-28-15 I left patient another message. Patient is needing an AEX with PAP

## 2015-01-30 ENCOUNTER — Encounter: Payer: Self-pay | Admitting: *Deleted

## 2015-01-30 NOTE — Telephone Encounter (Signed)
10- 6 -16 I left another message for patient to call in regards to scheduling an AEX. This was my third attempt. I will forward back to [place] for the next steps- eh

## 2015-01-30 NOTE — Telephone Encounter (Signed)
Pt has not returned call or scheduled annual exam.  Letter created.  Please advise recall.

## 2015-01-31 NOTE — Telephone Encounter (Signed)
OK to send letter and remove from recall. 

## 2015-02-10 NOTE — Telephone Encounter (Signed)
Letter marked as sent and mailed.  Pt removed from current recall.  Closing encounter.

## 2015-03-17 ENCOUNTER — Ambulatory Visit (INDEPENDENT_AMBULATORY_CARE_PROVIDER_SITE_OTHER): Payer: No Typology Code available for payment source | Admitting: Nurse Practitioner

## 2015-03-17 ENCOUNTER — Encounter: Payer: Self-pay | Admitting: Nurse Practitioner

## 2015-03-17 VITALS — BP 100/66 | HR 68 | Ht 63.25 in | Wt 216.0 lb

## 2015-03-17 DIAGNOSIS — E559 Vitamin D deficiency, unspecified: Secondary | ICD-10-CM

## 2015-03-17 DIAGNOSIS — Z01419 Encounter for gynecological examination (general) (routine) without abnormal findings: Secondary | ICD-10-CM | POA: Diagnosis not present

## 2015-03-17 DIAGNOSIS — Z0000 Encounter for general adult medical examination without abnormal findings: Secondary | ICD-10-CM

## 2015-03-17 DIAGNOSIS — N920 Excessive and frequent menstruation with regular cycle: Secondary | ICD-10-CM | POA: Diagnosis not present

## 2015-03-17 DIAGNOSIS — Z3009 Encounter for other general counseling and advice on contraception: Secondary | ICD-10-CM | POA: Diagnosis not present

## 2015-03-17 LAB — COMPREHENSIVE METABOLIC PANEL
ALBUMIN: 3.7 g/dL (ref 3.6–5.1)
ALK PHOS: 66 U/L (ref 33–115)
ALT: 8 U/L (ref 6–29)
AST: 14 U/L (ref 10–30)
BUN: 11 mg/dL (ref 7–25)
CALCIUM: 9 mg/dL (ref 8.6–10.2)
CHLORIDE: 107 mmol/L (ref 98–110)
CO2: 27 mmol/L (ref 20–31)
Creat: 0.77 mg/dL (ref 0.50–1.10)
Glucose, Bld: 83 mg/dL (ref 65–99)
POTASSIUM: 5.1 mmol/L (ref 3.5–5.3)
Sodium: 141 mmol/L (ref 135–146)
TOTAL PROTEIN: 6.2 g/dL (ref 6.1–8.1)
Total Bilirubin: 0.3 mg/dL (ref 0.2–1.2)

## 2015-03-17 LAB — CBC WITH DIFFERENTIAL/PLATELET
BASOS PCT: 0 % (ref 0–1)
Basophils Absolute: 0 10*3/uL (ref 0.0–0.1)
Eosinophils Absolute: 0.1 10*3/uL (ref 0.0–0.7)
Eosinophils Relative: 2 % (ref 0–5)
HCT: 37.2 % (ref 36.0–46.0)
Hemoglobin: 12.3 g/dL (ref 12.0–15.0)
LYMPHS PCT: 31 % (ref 12–46)
Lymphs Abs: 2 10*3/uL (ref 0.7–4.0)
MCH: 27 pg (ref 26.0–34.0)
MCHC: 33.1 g/dL (ref 30.0–36.0)
MCV: 81.6 fL (ref 78.0–100.0)
MPV: 10.8 fL (ref 8.6–12.4)
Monocytes Absolute: 0.6 10*3/uL (ref 0.1–1.0)
Monocytes Relative: 9 % (ref 3–12)
NEUTROS ABS: 3.8 10*3/uL (ref 1.7–7.7)
NEUTROS PCT: 58 % (ref 43–77)
PLATELETS: 277 10*3/uL (ref 150–400)
RBC: 4.56 MIL/uL (ref 3.87–5.11)
RDW: 14.5 % (ref 11.5–15.5)
WBC: 6.5 10*3/uL (ref 4.0–10.5)

## 2015-03-17 LAB — HEMOGLOBIN, FINGERSTICK: HEMOGLOBIN, FINGERSTICK: 11.9 g/dL — AB (ref 12.0–16.0)

## 2015-03-17 LAB — LIPID PANEL
CHOL/HDL RATIO: 2.7 ratio (ref <=5.0)
Cholesterol: 148 mg/dL (ref 125–200)
HDL: 55 mg/dL (ref >=46)
LDL CALC: 79 mg/dL (ref <130)
Triglycerides: 69 mg/dL (ref <150)
VLDL: 14 mg/dL (ref <30)

## 2015-03-17 LAB — THYROID PANEL WITH TSH
Free Thyroxine Index: 2.2 (ref 1.4–3.8)
T3 UPTAKE: 30 % (ref 22–35)
T4 TOTAL: 7.2 ug/dL (ref 4.5–12.0)
TSH: 1.508 u[IU]/mL (ref 0.350–4.500)

## 2015-03-17 LAB — VITAMIN B12: VITAMIN B 12: 376 pg/mL (ref 211–911)

## 2015-03-17 LAB — HIV ANTIBODY (ROUTINE TESTING W REFLEX): HIV: NONREACTIVE

## 2015-03-17 NOTE — Patient Instructions (Signed)

## 2015-03-17 NOTE — Progress Notes (Signed)
Patient ID: [name], female   DOB: 1975/06/14, 40 y.o.   MRN: [id number] 39 y.o. [id number] Married  Caucasian Fe here for annual exam.  Menses is 7-10 days, heavy for 5 days.  Changing pad and super tampon every 2 hours.  Condoms for birth control most times.  Sister with Mirena and doing well wants to consider.  Patient's last menstrual period was 03/01/2015 (approximate).          Sexually active: Yes.    The current method of family planning is condoms sometimes.    Exercising: No.  The patient does not participate in regular exercise at present. Smoker:  no  Health Maintenance: Pap:  10/30/13, Negative (history of LEEP 1999 & 2000) TDaP:  09/29/2007 Labs: HB: 11.9  Urine: negative   reports that she has never smoked. She has never used smokeless tobacco. She reports that she does not drink alcohol or use illicit drugs.  Past Medical History  Diagnosis Date  . History of ectopic pregnancy 2009    left, tube preserved  . Allergic rhinitis   . Obesity, Class II, BMI 35-39.9 09/29/2007    Qualifier: Diagnosis of  By: Wynona Luna   . TMJ pain dysfunction syndrome 2015    (causing otalgia/referred pain)-ENT is Dr. Redmond Baseman    Past Surgical History  Procedure Laterality Date  . Cesarean section       2007 & 2010  . 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 Leep  1999&2000  . Tympanostomy tube placement  as a child  . Laparoscopy for ectopic pregnancy  2009    salpingostomy    No current outpatient prescriptions on file.   No current facility-administered medications for this visit.    Family History  Problem Relation Age of Onset  . Bipolar disorder Mother   . Diabetes Mother   . Hypertension Father   . Peripheral vascular disease Maternal Grandmother     died during carotid surgery    ROS:  Pertinent items are noted in HPI.  Otherwise, a comprehensive ROS was negative.  Exam:   BP 100/66 mmHg  Pulse 68  Ht 5' 3.25" (1.607 m)  Wt 216  lb (97.977 kg)  BMI 37.94 kg/m2  LMP 03/01/2015 (Approximate) Height: 5' 3.25" (160.7 cm) Ht Readings from Last 3 Encounters:  03/17/15 5' 3.25" (1.607 m)  01/06/15 5' 3.25" (1.607 m)  11/04/14 5' 3.25" (1.607 m)    General appearance: alert, cooperative and appears stated age Head: Normocephalic, without obvious abnormality, atraumatic Neck: no adenopathy, supple, symmetrical, trachea midline and thyroid normal to inspection and palpation Lungs: clear to auscultation bilaterally Breasts: normal appearance, no masses or tenderness Heart: regular rate and rhythm Abdomen: soft, non-tender; no masses,  no organomegaly Extremities: extremities normal, atraumatic, no cyanosis or edema Skin: Skin color, texture, turgor normal. No rashes or lesions Lymph nodes: Cervical, supraclavicular, and axillary nodes normal. No abnormal inguinal nodes palpated Neurologic: Grossly normal   Pelvic: External genitalia:  no lesions              Urethra:  normal appearing urethra with no masses, tenderness or lesions              Bartholin's and Skene's: normal                 Vagina: normal appearing vagina with normal color and discharge, no lesions  Cervix: anteverted              Pap taken: Yes.   Bimanual Exam:  Uterus:  normal size, contour, position, consistency, mobility, non-tender              Adnexa: no mass, fullness, tenderness               Rectovaginal: Confirms               Anus:  normal sphincter tone, no lesions  Chaperone present: yes  A:  Well Woman with normal exam  History of menorrhagia and anemia  Contraceptive needs  Remote history of LEEP 1999 & 2000 - normal since  Mother with history of Vit B 12 def.    P:   Reviewed health and wellness pertinent to exam  Pap smear as above  Information is given about Mirena IUD and she will call insurance company  Will also get PUS to see if fibroids as the cause of menorrhagia before IUD is placed  Will follow with  labs  Counseled on breast self exam, family planning choices, adequate intake of calcium and vitamin D, diet and exercise, Kegel's exercises return annually or prn  An After Visit Summary was printed and given to the patient.

## 2015-03-18 LAB — VITAMIN D 25 HYDROXY (VIT D DEFICIENCY, FRACTURES): VIT D 25 HYDROXY: 20 ng/mL — AB (ref 30–100)

## 2015-03-18 NOTE — Progress Notes (Signed)
Encounter reviewed by Dr. Brook Amundson C. Silva.  

## 2015-03-21 LAB — IPS PAP TEST WITH HPV

## 2015-03-24 ENCOUNTER — Telehealth: Payer: Self-pay | Admitting: Nurse Practitioner

## 2015-03-24 NOTE — Telephone Encounter (Signed)
Spoke with patient and reviewed benefits for pelvic ultrasound. Patient understood but had concerns. Patient wishes to discuss with spouse prior to scheduling. Patient states she will return call to discuss/schedule. Routing to clinical nurse for review.

## 2015-03-27 NOTE — Telephone Encounter (Signed)
Becky, Please defer Pelvic ultrasound for one week and return call to patient for follow up.

## 2015-03-28 ENCOUNTER — Telehealth: Payer: Self-pay | Admitting: *Deleted

## 2015-03-28 MED ORDER — VITAMIN D (ERGOCALCIFEROL) 1.25 MG (50000 UNIT) PO CAPS: 50000.0000 [IU] | ORAL_CAPSULE | ORAL | Status: DC

## 2015-03-28 NOTE — Telephone Encounter (Signed)
-----   Message from Kem Boroughs, [place] sent at 03/18/2015  8:38 AM EST ----- Please let patient know that Vit D is very low at 20 - she needs Vit D per protocol with a 3 month recheck.  The HIV, Lipid, thyroid panel with TSH, CMP, CBC, and Vit B 12 is all normal.

## 2015-03-28 NOTE — Telephone Encounter (Signed)
Pt notified in result note.  Closing encounter. 

## 2015-03-28 NOTE — Addendum Note (Signed)
Addended by: Graylon Good on: 03/28/2015 02:23 PM   Modules accepted: Orders, SmartSet

## 2015-06-20 ENCOUNTER — Other Ambulatory Visit (INDEPENDENT_AMBULATORY_CARE_PROVIDER_SITE_OTHER): Payer: No Typology Code available for payment source

## 2015-06-20 DIAGNOSIS — E559 Vitamin D deficiency, unspecified: Secondary | ICD-10-CM

## 2015-06-21 LAB — VITAMIN D 25 HYDROXY (VIT D DEFICIENCY, FRACTURES): VIT D 25 HYDROXY: 35 ng/mL (ref 30–100)

## 2015-06-27 ENCOUNTER — Ambulatory Visit (INDEPENDENT_AMBULATORY_CARE_PROVIDER_SITE_OTHER): Payer: No Typology Code available for payment source | Admitting: *Deleted

## 2015-06-27 DIAGNOSIS — Z23 Encounter for immunization: Secondary | ICD-10-CM | POA: Diagnosis not present

## 2015-09-18 ENCOUNTER — Other Ambulatory Visit: Payer: Self-pay | Admitting: Nurse Practitioner

## 2015-09-18 NOTE — Telephone Encounter (Signed)
Medication refill request: Vitamin D  Last AEX:  03-17-15  Next AEX: 03-22-16 Last MMG (if hormonal medication request): N/A Refill authorized: please advise

## 2015-10-25 DIAGNOSIS — E611 Iron deficiency: Secondary | ICD-10-CM

## 2015-10-25 HISTORY — DX: Iron deficiency: E61.1

## 2015-11-17 ENCOUNTER — Encounter: Payer: Self-pay | Admitting: Family Medicine

## 2015-11-17 ENCOUNTER — Ambulatory Visit (INDEPENDENT_AMBULATORY_CARE_PROVIDER_SITE_OTHER): Payer: No Typology Code available for payment source | Admitting: Family Medicine

## 2015-11-17 VITALS — BP 101/68 | HR 75 | Temp 98.6°F | Resp 16 | Ht 63.25 in | Wt 217.5 lb

## 2015-11-17 DIAGNOSIS — L659 Nonscarring hair loss, unspecified: Secondary | ICD-10-CM

## 2015-11-17 DIAGNOSIS — D509 Iron deficiency anemia, unspecified: Secondary | ICD-10-CM

## 2015-11-17 LAB — IRON AND TIBC
%SAT: 10 % — AB (ref 11–50)
Iron: 35 ug/dL — ABNORMAL LOW (ref 40–190)
TIBC: 348 ug/dL (ref 250–450)
UIBC: 313 ug/dL (ref 125–400)

## 2015-11-17 NOTE — Progress Notes (Signed)
OFFICE VISIT  11/17/2015  CC:  Chief Complaint  Patient presents with  . Alopecia    x 1 month   HPI:    Patient is a 40 y.o. Caucasian female who presents for hair loss. Notes thinning of her hair, it comes out a lot when she brushes it.  She noted it starting approx 1 yr ago. Was dx'd with vit D def by her GYN about 8 mo ago, was put on high dose Vit D and noted improvement in hair loss.  However, then the hair loss seems to have returned when her high dose vit D dosing was changed to every other week (her vit D had come up a little bit into the low-normal range).  Menses are regular but heavy: 4 d of changing pad q2hr.  LMP was about 10d ago. Energy level "ok".  Appetite is "fine".   Mood is fine. Lots of stress--says her husband and her are getting divorced.  Past Medical History:  Diagnosis Date  . Allergic rhinitis   . History of ectopic pregnancy 2009   left, tube preserved  . Obesity, Class II, BMI 35-39.9 09/29/2007   Qualifier: Diagnosis of  By: Wynona Luna   . TMJ pain dysfunction syndrome 2015   (causing otalgia/referred pain)-ENT is Dr. Redmond Baseman    Past Surgical History:  Procedure Laterality Date  . CESAREAN SECTION      2007 & 2010  . CHOLECYSTECTOMY     2004 (for dysfunctional GB not for stones)  . COLONOSCOPY  10/18/2002   Severe diverticulosis from transverse to sigmoid colon, with increased spasm and tortuosity: likely cause of her urgency.  Recall 5 yrs (Dr. Lyla Son).  Marland Kitchen LAPAROSCOPY FOR ECTOPIC PREGNANCY  2009   salpingostomy  . LEEP  1999&2000  . TYMPANOSTOMY TUBE PLACEMENT  as a child    Outpatient Medications Prior to Visit  Medication Sig Dispense Refill  . Vitamin D, Ergocalciferol, (DRISDOL) 50000 units CAPS capsule TAKE 1 CAPSULE (50,000 UNITS TOTAL) BY MOUTH EVERY 7 (SEVEN) DAYS. 30 capsule 0   No facility-administered medications prior to visit.     Allergies  Allergen Reactions  . Sulfa Antibiotics Hives    ROS As per  HPI  PE: Blood pressure 101/68, pulse 75, temperature 98.6 F (37 C), temperature source Oral, resp. rate 16, height 5' 3.25" (1.607 m), weight 217 lb 8 oz (98.7 kg), SpO2 96 %. Gen: Alert, well appearing.  Patient is oriented to person, place, time, and situation. AFFECT: pleasant, lucid thought and speech. Scalp: scalp appears normal, without any focal lesion or flaking or discoloration.  Hair shafts appear normal.  No sign of hair thinning on my exam.  No patches of hair loss. [id number]: no injection, icteris, swelling, or exudate.  EOMI, PERRLA. Mouth: lips without lesion/swelling.  Oral mucosa pink and moist. Oropharynx without erythema, exudate, or swelling.  Neck - No masses or thyromegaly or limitation in range of motion CV: RRR, no m/r/g.   LUNGS: CTA bilat, nonlabored resps, good aeration in all lung fields. EXT: no clubbing, cyanosis, or edema.    LABS:  Lab Results  Component Value Date   TSH 1.508 03/17/2015   Lab Results  Component Value Date   WBC 6.5 03/17/2015   HGB 12.3 03/17/2015   HCT 37.2 03/17/2015   MCV 81.6 03/17/2015   PLT 277 03/17/2015   Lab Results  Component Value Date   CREATININE 0.77 03/17/2015   BUN 11 03/17/2015   NA  141 03/17/2015   K 5.1 03/17/2015   CL 107 03/17/2015   CO2 27 03/17/2015   Lab Results  Component Value Date   ALT 8 03/17/2015   AST 14 03/17/2015   ALKPHOS 66 03/17/2015   BILITOT 0.3 03/17/2015   Lab Results  Component Value Date   CHOL 148 03/17/2015   Lab Results  Component Value Date   HDL 55 03/17/2015   Lab Results  Component Value Date   LDLCALC 79 03/17/2015   Lab Results  Component Value Date   TRIG 69 03/17/2015   Lab Results  Component Value Date   CHOLHDL 2.7 03/17/2015    IMPRESSION AND PLAN:  1) Hair loss, nonscarring: 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  I don't know if her vit D deficiency really is playing a role or not. Iron  deficiency would more likely have an impact on hair loss, and pt is at risk for this due to her menorrhagia.   Check CBC, iron, IBC, ferritin, TSH, T4, T3, and vit D level. She already has a good iron pill chosen that she'll begin once daily. Considering dermatologist referral.  An After Visit Summary was printed and given to the patient.  FOLLOW UP: Return in about 3 months (around 02/17/2016) for f/u hair loss.  Signed:  Crissie Sickles, MD           11/17/2015

## 2015-11-17 NOTE — Progress Notes (Signed)
Pre visit review using our clinic review tool, if applicable. No additional management support is needed unless otherwise documented below in the visit note. 

## 2015-11-18 LAB — CBC WITH DIFFERENTIAL/PLATELET
BASOS PCT: 0.4 % (ref 0.0–3.0)
Basophils Absolute: 0 10*3/uL (ref 0.0–0.1)
EOS ABS: 0.1 10*3/uL (ref 0.0–0.7)
EOS PCT: 0.8 % (ref 0.0–5.0)
HCT: 38.6 % (ref 36.0–46.0)
HEMOGLOBIN: 12.7 g/dL (ref 12.0–15.0)
LYMPHS ABS: 1.8 10*3/uL (ref 0.7–4.0)
Lymphocytes Relative: 25.7 % (ref 12.0–46.0)
MCHC: 33 g/dL (ref 30.0–36.0)
MCV: 80.8 fl (ref 78.0–100.0)
MONO ABS: 0.3 10*3/uL (ref 0.1–1.0)
Monocytes Relative: 4.8 % (ref 3.0–12.0)
NEUTROS ABS: 4.8 10*3/uL (ref 1.4–7.7)
NEUTROS PCT: 68.3 % (ref 43.0–77.0)
PLATELETS: 268 10*3/uL (ref 150.0–400.0)
RBC: 4.78 Mil/uL (ref 3.87–5.11)
RDW: 14.9 % (ref 11.5–15.5)
WBC: 7 10*3/uL (ref 4.0–10.5)

## 2015-11-18 LAB — T4, FREE: FREE T4: 0.9 ng/dL (ref 0.60–1.60)

## 2015-11-18 LAB — T3: T3, Total: 109 ng/dL (ref 76–181)

## 2015-11-18 LAB — FERRITIN: Ferritin: 7.4 ng/mL — ABNORMAL LOW (ref 10.0–291.0)

## 2015-11-18 LAB — IRON: Iron: 34 ug/dL — ABNORMAL LOW (ref 42–145)

## 2015-11-18 LAB — TSH: TSH: 0.83 u[IU]/mL (ref 0.35–4.50)

## 2015-11-18 LAB — VITAMIN D 25 HYDROXY (VIT D DEFICIENCY, FRACTURES): VITD: 33.44 ng/mL (ref 30.00–100.00)

## 2015-11-20 ENCOUNTER — Encounter: Payer: Self-pay | Admitting: Family Medicine

## 2015-12-03 ENCOUNTER — Ambulatory Visit (INDEPENDENT_AMBULATORY_CARE_PROVIDER_SITE_OTHER): Payer: No Typology Code available for payment source | Admitting: Family Medicine

## 2015-12-03 ENCOUNTER — Encounter: Payer: Self-pay | Admitting: Family Medicine

## 2015-12-03 VITALS — BP 100/67 | HR 79 | Temp 98.5°F | Resp 16 | Ht 63.25 in | Wt 218.2 lb

## 2015-12-03 DIAGNOSIS — E611 Iron deficiency: Secondary | ICD-10-CM | POA: Diagnosis not present

## 2015-12-03 NOTE — Progress Notes (Signed)
Pre visit review using our clinic review tool, if applicable. No additional management support is needed unless otherwise documented below in the visit note. 

## 2015-12-03 NOTE — Progress Notes (Signed)
OFFICE VISIT  12/03/2015   CC:  Chief Complaint  Patient presents with  . Follow-up    Iron, Vit D and hair loss     HPI:    Patient is a 40 y.o. Caucasian female who presents for 2 week f/u hair loss. Last visit revealed low-normal vit D level and low iron (due to her menorrhagia).  Taking 5000 IU vit D qd. She started an iron tab 520 mg bid. She says the iron is not orally tolerable.  Has daily abd pain and either constipation or diarrhea. Asks about iron infusion again.  Asks for referral to specialist who can help with this.  Says hair continues to fall out in amounts that make her very anxious.  Her mother is with her today to vouch for her/provide support.  Past Medical History:  Diagnosis Date  . Allergic rhinitis   . History of ectopic pregnancy 2009   left, tube preserved  . Iron deficiency 10/2015   w/out anemia  . Menorrhagia   . Obesity, Class II, BMI 35-39.9 09/29/2007   Qualifier: Diagnosis of  By: Wynona Luna   . TMJ pain dysfunction syndrome 2015   (causing otalgia/referred pain)-ENT is Dr. Redmond Baseman  . Vitamin D deficiency     Past Surgical History:  Procedure Laterality Date  . CESAREAN SECTION      2007 & 2010  . CHOLECYSTECTOMY     2004 (for dysfunctional GB not for stones)  . COLONOSCOPY  10/18/2002   Severe diverticulosis from transverse to sigmoid colon, with increased spasm and tortuosity: likely cause of her urgency.  Recall 5 yrs (Dr. Lyla Son).  Marland Kitchen LAPAROSCOPY FOR ECTOPIC PREGNANCY  2009   salpingostomy  . LEEP  1999&2000  . TYMPANOSTOMY TUBE PLACEMENT  as a child    Outpatient Medications Prior to Visit  Medication Sig Dispense Refill  . Vitamin D, Ergocalciferol, (DRISDOL) 50000 units CAPS capsule TAKE 1 CAPSULE (50,000 UNITS TOTAL) BY MOUTH EVERY 7 (SEVEN) DAYS. (Patient not taking: Reported on 12/03/2015) 30 capsule 0   No facility-administered medications prior to visit.     Allergies  Allergen Reactions  . Sulfa Antibiotics  Hives    ROS As per HPI  PE: Blood pressure 100/67, pulse 79, temperature 98.5 F (36.9 C), temperature source Oral, resp. rate 16, height 5' 3.25" (1.607 m), weight 218 lb 4 oz (99 kg), SpO2 97 %. Gen: Alert, well appearing.  Patient is oriented to person, place, time, and situation. AFFECT: pleasant, lucid thought and speech.   LABS:  Lab Results  Component Value Date   WBC 7.0 11/17/2015   HGB 12.7 11/17/2015   HCT 38.6 11/17/2015   MCV 80.8 11/17/2015   PLT 268.0 11/17/2015   Lab Results  Component Value Date   IRON 34 (L) 11/17/2015   IRON 35 (L) 11/17/2015   TIBC 348 11/17/2015   FERRITIN 7.4 (L) 11/17/2015     IMPRESSION AND PLAN:  1) Hair loss, suspected to be related to low iron.  Her low iron is coming from chronic vaginal blood loss with abnormaly heavy periods.   Not tolerating oral iron, asks again for iron infusion so I have referred her to hematology for further evaluation and consideration of this.  I told her I have never done an iron infusion unless the iron deficiency has actually led to anemia.  2) Vit D def: low normal.  Continue 5000 U qd and recheck level in about 3 mo.  An After  Visit Summary was printed and given to the patient.  FOLLOW UP: Return if symptoms worsen or fail to improve.  Signed:  Crissie Sickles, MD           12/03/2015

## 2015-12-15 ENCOUNTER — Ambulatory Visit: Payer: No Typology Code available for payment source | Admitting: Family

## 2015-12-15 ENCOUNTER — Ambulatory Visit: Payer: No Typology Code available for payment source

## 2015-12-15 ENCOUNTER — Other Ambulatory Visit: Payer: No Typology Code available for payment source

## 2015-12-17 ENCOUNTER — Other Ambulatory Visit: Payer: Self-pay | Admitting: Family

## 2015-12-17 DIAGNOSIS — D500 Iron deficiency anemia secondary to blood loss (chronic): Secondary | ICD-10-CM

## 2015-12-18 ENCOUNTER — Encounter: Payer: Self-pay | Admitting: Family

## 2015-12-18 ENCOUNTER — Ambulatory Visit: Payer: No Typology Code available for payment source

## 2015-12-18 ENCOUNTER — Ambulatory Visit (HOSPITAL_BASED_OUTPATIENT_CLINIC_OR_DEPARTMENT_OTHER): Payer: No Typology Code available for payment source

## 2015-12-18 ENCOUNTER — Ambulatory Visit (HOSPITAL_BASED_OUTPATIENT_CLINIC_OR_DEPARTMENT_OTHER): Payer: No Typology Code available for payment source | Admitting: Family

## 2015-12-18 ENCOUNTER — Other Ambulatory Visit (HOSPITAL_BASED_OUTPATIENT_CLINIC_OR_DEPARTMENT_OTHER): Payer: No Typology Code available for payment source

## 2015-12-18 VITALS — BP 121/72 | HR 63 | Temp 98.2°F | Resp 16

## 2015-12-18 VITALS — BP 102/71 | HR 66 | Temp 97.8°F | Resp 18 | Ht 63.25 in | Wt 219.0 lb

## 2015-12-18 DIAGNOSIS — D509 Iron deficiency anemia, unspecified: Secondary | ICD-10-CM

## 2015-12-18 DIAGNOSIS — Z800 Family history of malignant neoplasm of digestive organs: Secondary | ICD-10-CM | POA: Diagnosis not present

## 2015-12-18 DIAGNOSIS — Z8759 Personal history of other complications of pregnancy, childbirth and the puerperium: Secondary | ICD-10-CM | POA: Diagnosis not present

## 2015-12-18 DIAGNOSIS — D510 Vitamin B12 deficiency anemia due to intrinsic factor deficiency: Secondary | ICD-10-CM

## 2015-12-18 DIAGNOSIS — E611 Iron deficiency: Secondary | ICD-10-CM | POA: Insufficient documentation

## 2015-12-18 DIAGNOSIS — Z808 Family history of malignant neoplasm of other organs or systems: Secondary | ICD-10-CM

## 2015-12-18 DIAGNOSIS — Z801 Family history of malignant neoplasm of trachea, bronchus and lung: Secondary | ICD-10-CM

## 2015-12-18 DIAGNOSIS — N920 Excessive and frequent menstruation with regular cycle: Secondary | ICD-10-CM

## 2015-12-18 DIAGNOSIS — D500 Iron deficiency anemia secondary to blood loss (chronic): Secondary | ICD-10-CM

## 2015-12-18 LAB — FERRITIN: Ferritin: 18 ng/ml (ref 9–269)

## 2015-12-18 LAB — CMP (CANCER CENTER ONLY)
ALT(SGPT): 13 U/L (ref 10–47)
AST: 18 U/L (ref 11–38)
Albumin: 3.7 g/dL (ref 3.3–5.5)
Alkaline Phosphatase: 72 U/L (ref 26–84)
BUN: 9 mg/dL (ref 7–22)
CALCIUM: 8.8 mg/dL (ref 8.0–10.3)
CHLORIDE: 104 meq/L (ref 98–108)
CO2: 28 mEq/L (ref 18–33)
Creat: 1.1 mg/dl (ref 0.6–1.2)
GLUCOSE: 90 mg/dL (ref 73–118)
POTASSIUM: 4.3 meq/L (ref 3.3–4.7)
Sodium: 135 mEq/L (ref 128–145)
TOTAL PROTEIN: 6.6 g/dL (ref 6.4–8.1)
Total Bilirubin: 0.8 mg/dl (ref 0.20–1.60)

## 2015-12-18 LAB — CBC WITH DIFFERENTIAL (CANCER CENTER ONLY)
BASO#: 0 10*3/uL (ref 0.0–0.2)
BASO%: 0.5 % (ref 0.0–2.0)
EOS ABS: 0.1 10*3/uL (ref 0.0–0.5)
EOS%: 1.6 % (ref 0.0–7.0)
HEMATOCRIT: 39 % (ref 34.8–46.6)
HGB: 13.1 g/dL (ref 11.6–15.9)
LYMPH#: 2 10*3/uL (ref 0.9–3.3)
LYMPH%: 35.4 % (ref 14.0–48.0)
MCH: 28 pg (ref 26.0–34.0)
MCHC: 33.6 g/dL (ref 32.0–36.0)
MCV: 83 fL (ref 81–101)
MONO#: 0.4 10*3/uL (ref 0.1–0.9)
MONO%: 7.4 % (ref 0.0–13.0)
NEUT#: 3.1 10*3/uL (ref 1.5–6.5)
NEUT%: 55.1 % (ref 39.6–80.0)
PLATELETS: 239 10*3/uL (ref 145–400)
RBC: 4.68 10*6/uL (ref 3.70–5.32)
RDW: 15.3 % (ref 11.1–15.7)
WBC: 5.7 10*3/uL (ref 3.9–10.0)

## 2015-12-18 LAB — IRON AND TIBC
%SAT: 18 % — AB (ref 21–57)
IRON: 57 ug/dL (ref 41–142)
TIBC: 320 ug/dL (ref 236–444)
UIBC: 263 ug/dL (ref 120–384)

## 2015-12-18 LAB — RETICULOCYTES: RETICULOCYTE COUNT: 1.4 % (ref 0.6–2.6)

## 2015-12-18 LAB — CHCC SATELLITE - SMEAR

## 2015-12-18 MED ORDER — SODIUM CHLORIDE 0.9 % IV SOLN
Freq: Once | INTRAVENOUS | Status: AC
  Administered 2015-12-18: 12:00:00 via INTRAVENOUS

## 2015-12-18 MED ORDER — SODIUM CHLORIDE 0.9 % IV SOLN
510.0000 mg | Freq: Once | INTRAVENOUS | Status: AC
  Administered 2015-12-18: 510 mg via INTRAVENOUS
  Filled 2015-12-18: qty 17

## 2015-12-18 NOTE — Progress Notes (Signed)
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 [name] is a very pleasant 40 yo white female with history of iron deficiency having failed PO iron supplements. She has heavy cycles last 8-9 days. She is not interested in birth control at this time.  She is symptomatic with fatigued, weakness, dizziness and palpitations. She has also had arthralgia pain in her joints.  Her mother is also has iron deficiency and pernicious anemia and receives iron infusions as needed.  I have added a B 12 level to her lab work today.  No personal cancer history. Family cancer history includes: paternal grandmother - pancreatic and paternal grandfather - liver and lung.  She has history of 1 ectopic pregnancy, 2 miscarriages and 2 births. She had 2 C-sections and no problem with bleeding during or after surgery.  No fever, chills, n/v, cough, rash, SOB, chest pain, abdominal pain or changes in bowel or bladder habits. She had her gallbladder removed in her 20's and has occasional IBS with diarrhea.  No swelling, tenderness, numbness or tingling in her extremities. No new aches or pains.  She has maintained a good appetite and is staying well hydrated. Her weight is stable.   She not a smoker and does not drink alcohol.   ROS: All other 10 point review of systems is negative.   PAST MEDICAL HISTORY:   Past Medical History:  Diagnosis Date  . Allergic rhinitis   . History of ectopic pregnancy 2009   left, tube preserved  . Iron deficiency 10/2015   w/out anemia  . Menorrhagia   . Obesity, Class II, BMI 35-39.9 09/29/2007   Qualifier: Diagnosis of  By: Wynona Luna   . TMJ pain dysfunction syndrome 2015   (causing otalgia/referred  pain)-ENT is Dr. Redmond Baseman  . Vitamin D deficiency     ALLERGIES: Allergies  Allergen Reactions  . Sulfa Antibiotics Hives      MEDICATIONS:  Current Outpatient Prescriptions on File Prior to Visit  Medication Sig Dispense Refill  . Cholecalciferol (VITAMIN D3) 5000 units CAPS Take 1 capsule by mouth daily.    . IRON PO Take 520 mg by mouth 2 (two) times daily.     No current facility-administered medications on file prior to visit.      PAST SURGICAL HISTORY Past Surgical History:  Procedure Laterality Date  . CESAREAN SECTION      2007 & 2010  . CHOLECYSTECTOMY     2004 (for dysfunctional GB not for stones)  . COLONOSCOPY  10/18/2002   Severe diverticulosis from transverse to sigmoid colon, with increased spasm and tortuosity: likely cause of her urgency.  Recall 5 yrs (Dr. Lyla Son).  Marland Kitchen LAPAROSCOPY FOR ECTOPIC PREGNANCY  2009   salpingostomy  . LEEP  1999&2000  . TYMPANOSTOMY TUBE PLACEMENT  as a child    FAMILY HISTORY: Family History  Problem Relation Age of Onset  . Bipolar disorder Mother   . Diabetes Mother   . Hypertension Father   . Peripheral vascular disease Maternal Grandmother     died during carotid surgery    SOCIAL HISTORY:  reports that she has never smoked. She has never used smokeless tobacco. She reports that she does  not drink alcohol or use drugs.  PERFORMANCE STATUS: The patient's performance status is 1 - Symptomatic but completely ambulatory  PHYSICAL EXAM: Most Recent Vital Signs: Blood pressure 102/71, pulse 66, temperature 97.8 F (36.6 C), temperature source Oral, resp. rate 18, height 5' 3.25" (1.607 m), weight 219 lb (99.3 kg). BP 102/71 (BP Location: Left Arm, Patient Position: Sitting)   Pulse 66   Temp 97.8 F (36.6 C) (Oral)   Resp 18   Ht 5' 3.25" (1.607 m)   Wt 219 lb (99.3 kg)   BMI 38.49 kg/m   General Appearance:    Alert, cooperative, no distress, appears stated age  Head:    Normocephalic, without obvious  abnormality, atraumatic  Eyes:    PERRL, conjunctiva/corneas clear, EOM's intact, fundi    benign, both eyes        Throat:   Lips, mucosa, and tongue normal; teeth and gums normal  Neck:   Supple, symmetrical, trachea midline, no adenopathy;    thyroid:  no enlargement/tenderness/nodules; no carotid   bruit or JVD  Back:     Symmetric, no curvature, ROM normal, no CVA tenderness  Lungs:     Clear to auscultation bilaterally, respirations unlabored  Chest Wall:    No tenderness or deformity   Heart:    Regular rate and rhythm, S1 and S2 normal, no murmur, rub   or gallop     Abdomen:     Soft, non-tender, bowel sounds active all four quadrants,    no masses, no organomegaly        Extremities:   Extremities normal, atraumatic, no cyanosis or edema  Pulses:   2+ and symmetric all extremities  Skin:   Skin color, texture, turgor normal, no rashes or lesions  Lymph nodes:   Cervical, supraclavicular, and axillary nodes normal  Neurologic:   CNII-XII intact, normal strength, sensation and reflexes    throughout    LABORATORY DATA: Results for orders placed or performed in visit on 12/18/15 (from the past 48 hour(s))  CBC w/Diff     Status: None   Collection Time: 12/18/15  9:34 AM  Result Value Ref Range   WBC 5.7 3.9 - 10.0 10e3/uL   RBC 4.68 3.70 - 5.32 10e6/uL   HGB 13.1 11.6 - 15.9 g/dL   HCT 39.0 34.8 - 46.6 %   MCV 83 81 - 101 fL   MCH 28.0 26.0 - 34.0 pg   MCHC 33.6 32.0 - 36.0 g/dL   RDW 15.3 11.1 - 15.7 %   Platelets 239 145 - 400 10e3/uL   NEUT# 3.1 1.5 - 6.5 10e3/uL   LYMPH# 2.0 0.9 - 3.3 10e3/uL   MONO# 0.4 0.1 - 0.9 10e3/uL   Eosinophils Absolute 0.1 0.0 - 0.5 10e3/uL   BASO# 0.0 0.0 - 0.2 10e3/uL   NEUT% 55.1 39.6 - 80.0 %   LYMPH% 35.4 14.0 - 48.0 %   MONO% 7.4 0.0 - 13.0 %   EOS% 1.6 0.0 - 7.0 %   BASO% 0.5 0.0 - 2.0 %  CMP STAT     Status: None   Collection Time: 12/18/15  9:34 AM  Result Value Ref Range   Sodium 135 128 - 145 mEq/L   Potassium 4.3  3.3 - 4.7 mEq/L   Chloride 104 98 - 108 mEq/L   CO2 28 18 - 33 mEq/L   Glucose, Bld 90 73 - 118 mg/dL   BUN, Bld 9 7 - 22 mg/dL   Creat 1.1  0.6 - 1.2 mg/dl   Total Bilirubin 0.80 0.20 - 1.60 mg/dl   Alkaline Phosphatase 72 26 - 84 U/L   AST 18 11 - 38 U/L   ALT(SGPT) 13 10 - 47 U/L   Total Protein 6.6 6.4 - 8.1 g/dL   Albumin 3.7 3.3 - 5.5 g/dL   Calcium 8.8 8.0 - 10.3 mg/dL  Smear     Status: None   Collection Time: 12/18/15  9:34 AM  Result Value Ref Range   Smear Result Smear Available       RADIOGRAPHY: No results found.     PATHOLOGY: None  ASSESSMENT/PLAN: Ms. Laidler is a very pleasant 40 yo white female with history of iron deficiency secondary to menorrhagia. Oral iron supplements were not effective. Her iron saturation at this time is 18% and ferritin 18. She is symptomatic with fatigue, weakness, dizziness, palpitations and generalized arthralgia pain.  Her Hgb is stable at 13.1 with an MCV of 83. B 12 level was normal.  We will go ahead and give her a dose of feraheme today and plan to give a second dose in 8 days.  She will follow-up with our office in 6 weeks for repeat lab work and routine visit.   All questions were answered. She will contact us with any problems, questions or concerns. We can certainly see her much sooner if necessary.  She was discussed with and also seen by Dr. Marin Olp and he is in agreement with the aforementioned.   [facility] M     Addendum:  I saw and examined the patient with Sarah. I will get her blood smear. She definitely had evidence of iron deficiency. Her ferritin is 18. Her iron saturation is 18%.  She has a history of menometrorrhagia. I'm sure this is why she is iron deficient. I'm sure that her gynecologist will help out with this.  We will go ahead and give her IV iron. I know this will help her out and make her feel better. She is not really anemic but yet I think that she would be close to dropping her blood count  because of the iron deficiency.  We spent about 35-40 minutes with her. We went over her lab work. We answered all of her questions.  We will plan to get her back to see [place] in another 6 weeks.  Lattie Haw, MD

## 2015-12-18 NOTE — Patient Instructions (Signed)

## 2015-12-19 LAB — HEMOGLOBINOPATHY EVALUATION
HEMOGLOBIN F QUANTITATION: 0 % (ref 0.0–2.0)
HGB C: 0 %
HGB S: 0 %
Hemoglobin A2 Quantitation: 2.1 % (ref 0.7–3.1)
Hgb A: 97.9 % (ref 94.0–98.0)

## 2015-12-19 LAB — ERYTHROPOIETIN: Erythropoietin: 15.7 m[IU]/mL (ref 2.6–18.5)

## 2015-12-19 LAB — VITAMIN B12: Vitamin B12: 492 pg/mL (ref 211–946)

## 2015-12-26 ENCOUNTER — Ambulatory Visit (HOSPITAL_BASED_OUTPATIENT_CLINIC_OR_DEPARTMENT_OTHER): Payer: No Typology Code available for payment source

## 2015-12-26 ENCOUNTER — Encounter: Payer: Self-pay | Admitting: Family Medicine

## 2015-12-26 VITALS — BP 107/62 | HR 65 | Temp 98.0°F | Resp 20

## 2015-12-26 DIAGNOSIS — N920 Excessive and frequent menstruation with regular cycle: Secondary | ICD-10-CM | POA: Diagnosis not present

## 2015-12-26 DIAGNOSIS — D500 Iron deficiency anemia secondary to blood loss (chronic): Secondary | ICD-10-CM | POA: Diagnosis not present

## 2015-12-26 DIAGNOSIS — D509 Iron deficiency anemia, unspecified: Secondary | ICD-10-CM

## 2015-12-26 MED ORDER — SODIUM CHLORIDE 0.9 % IV SOLN
Freq: Once | INTRAVENOUS | Status: AC
  Administered 2015-12-26: 09:00:00 via INTRAVENOUS

## 2015-12-26 MED ORDER — SODIUM CHLORIDE 0.9 % IV SOLN
510.0000 mg | Freq: Once | INTRAVENOUS | Status: AC
  Administered 2015-12-26: 510 mg via INTRAVENOUS
  Filled 2015-12-26: qty 17

## 2015-12-26 NOTE — Patient Instructions (Signed)

## 2015-12-31 ENCOUNTER — Other Ambulatory Visit: Payer: Self-pay | Admitting: Family

## 2015-12-31 DIAGNOSIS — N920 Excessive and frequent menstruation with regular cycle: Secondary | ICD-10-CM | POA: Insufficient documentation

## 2015-12-31 HISTORY — DX: Excessive and frequent menstruation with regular cycle: N92.0

## 2016-01-28 ENCOUNTER — Ambulatory Visit: Payer: No Typology Code available for payment source

## 2016-01-28 ENCOUNTER — Other Ambulatory Visit (HOSPITAL_BASED_OUTPATIENT_CLINIC_OR_DEPARTMENT_OTHER): Payer: No Typology Code available for payment source

## 2016-01-28 ENCOUNTER — Encounter: Payer: Self-pay | Admitting: Family

## 2016-01-28 ENCOUNTER — Ambulatory Visit (HOSPITAL_BASED_OUTPATIENT_CLINIC_OR_DEPARTMENT_OTHER): Payer: No Typology Code available for payment source | Admitting: Family

## 2016-01-28 VITALS — BP 106/63 | HR 81 | Temp 98.1°F | Resp 16 | Ht 63.25 in | Wt 216.0 lb

## 2016-01-28 DIAGNOSIS — E611 Iron deficiency: Secondary | ICD-10-CM

## 2016-01-28 DIAGNOSIS — D500 Iron deficiency anemia secondary to blood loss (chronic): Secondary | ICD-10-CM

## 2016-01-28 DIAGNOSIS — D509 Iron deficiency anemia, unspecified: Secondary | ICD-10-CM

## 2016-01-28 DIAGNOSIS — D510 Vitamin B12 deficiency anemia due to intrinsic factor deficiency: Secondary | ICD-10-CM

## 2016-01-28 DIAGNOSIS — Z23 Encounter for immunization: Secondary | ICD-10-CM | POA: Insufficient documentation

## 2016-01-28 DIAGNOSIS — N920 Excessive and frequent menstruation with regular cycle: Secondary | ICD-10-CM | POA: Diagnosis not present

## 2016-01-28 HISTORY — DX: Encounter for immunization: Z23

## 2016-01-28 LAB — CBC WITH DIFFERENTIAL (CANCER CENTER ONLY)
BASO#: 0.1 10e3/uL (ref 0.0–0.2)
BASO%: 0.7 % (ref 0.0–2.0)
EOS%: 1.5 % (ref 0.0–7.0)
Eosinophils Absolute: 0.1 10e3/uL (ref 0.0–0.5)
HCT: 39.3 % (ref 34.8–46.6)
HGB: 13.6 g/dL (ref 11.6–15.9)
LYMPH#: 2.2 10e3/uL (ref 0.9–3.3)
LYMPH%: 30.1 % (ref 14.0–48.0)
MCH: 30 pg (ref 26.0–34.0)
MCHC: 34.6 g/dL (ref 32.0–36.0)
MCV: 87 fL (ref 81–101)
MONO#: 0.5 10e3/uL (ref 0.1–0.9)
MONO%: 7 % (ref 0.0–13.0)
NEUT#: 4.5 10e3/uL (ref 1.5–6.5)
NEUT%: 60.7 % (ref 39.6–80.0)
Platelets: 238 10e3/uL (ref 145–400)
RBC: 4.54 10e6/uL (ref 3.70–5.32)
RDW: 15.1 % (ref 11.1–15.7)
WBC: 7.4 10e3/uL (ref 3.9–10.0)

## 2016-01-28 MED ORDER — INFLUENZA VAC SPLIT QUAD 0.5 ML IM SUSY
0.5000 mL | PREFILLED_SYRINGE | Freq: Once | INTRAMUSCULAR | Status: DC
  Filled 2016-01-28: qty 0.5

## 2016-01-28 NOTE — Progress Notes (Signed)
Hematology and Oncology Follow Up Visit  [name] [id number] [birth year] 40 y.o. 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 She has responded nicely to the 2 doses of feraheme she received in Aug/Sept. Her CBC today looks great. MCV is improving. Iron studies are pending.  Her cycles are still quite heavy but regular.  Her palpitations have almost completely resolved. She still has some mild fatigue. She is no longer having  Arthralgia pain in her joints.  She states that her hair loss has stopped.  No fever, chills, n/v, cough, rash, dizziness, SOB, chest pain, abdominal pain or changes in bowel or bladder habits. She has diarrhea off and on since having her gallbladder removed.  No swelling, tenderness, numbness or tingling in her extremities. No new aches or pains.  She has maintained a good appetite and is staying well hydrated. Her weight is stable.   Medications:    Medication List       Accurate as of 01/28/16  1:47 PM. Always use your most recent med list.          IRON PO Take 520 mg by mouth 2 (two) times daily.   vitamin C 500 MG tablet Commonly known as:  ASCORBIC ACID Take 500 mg by mouth daily.   Vitamin D3 5000 units Caps Take 1 capsule by mouth daily.       Allergies:  Allergies  Allergen Reactions  . Sulfa Antibiotics Hives    Past Medical History, Surgical history, Social history, and Family History were reviewed and updated.  Review of Systems: All other 10 point review of systems is negative.   Physical Exam:  height is 5' 3.25" (1.607 m) and weight is 216 lb (98 kg). Her oral temperature is 98.1 F (36.7 C). Her blood pressure is 106/63 and her pulse is 81. Her respiration is 16.   Wt Readings from Last 3 Encounters:  01/28/16 216 lb (98 kg)  12/18/15 219 lb (99.3 kg)  12/03/15 218 lb 4 oz (99  kg)    Ocular: Sclerae unicteric, pupils equal, round and reactive to light Ear-nose-throat: Oropharynx clear, dentition fair Lymphatic: No cervical supraclavicular or axillary adenopathy Lungs no rales or rhonchi, good excursion bilaterally Heart regular rate and rhythm, no murmur appreciated Abd soft, nontender, positive bowel sounds, no liver or spleen tip palpated on exam  MSK no focal spinal tenderness, no joint edema Neuro: non-focal, well-oriented, appropriate affect Breasts: Deferred  Lab Results  Component Value Date   WBC 7.4 01/28/2016   HGB 13.6 01/28/2016   HCT 39.3 01/28/2016   MCV 87 01/28/2016   PLT 238 01/28/2016   Lab Results  Component Value Date   FERRITIN 18 12/18/2015   IRON 57 12/18/2015   TIBC 320 12/18/2015   UIBC 263 12/18/2015   IRONPCTSAT 18 (L) 12/18/2015   Lab Results  Component Value Date   RBC 4.54 01/28/2016   No results found for: KPAFRELGTCHN, LAMBDASER, KAPLAMBRATIO No results found for: IGGSERUM, IGA, IGMSERUM No results found for: Odetta Pink, SPEI   Chemistry      Component Value Date/Time   NA 135 12/18/2015 0934   K 4.3 12/18/2015 0934   CL 104 12/18/2015 0934   CO2 28 12/18/2015 0934   BUN 9 12/18/2015 0934   CREATININE 1.1 12/18/2015 0934  Component Value Date/Time   CALCIUM 8.8 12/18/2015 0934   ALKPHOS 72 12/18/2015 0934   AST 18 12/18/2015 0934   ALT 13 12/18/2015 0934   BILITOT 0.80 12/18/2015 0934     Impression and Plan: [name] is a very pleasant 40 yo white female with history of iron deficiency secondary to menorrhagia. She received 2 doses of Feraheme last month and is doing quite well. Her symptoms continue to improve.  CBC is stable and MCV is 87. We will see what her iron studies show and bring her back in later this week for an infusion if needed.  We will plan to see her back in 3 months for repeat lab work and follow-up.  She will contact  our office with any questions or concerns. We can certainly see her sooner if need be.   Eliezer Bottom, NP 10/4/20171:47 PM

## 2016-01-29 ENCOUNTER — Telehealth: Payer: Self-pay | Admitting: *Deleted

## 2016-01-29 LAB — IRON AND TIBC
%SAT: 56 % (ref 21–57)
Iron: 129 ug/dL (ref 41–142)
TIBC: 229 ug/dL — AB (ref 236–444)
UIBC: 100 ug/dL — ABNORMAL LOW (ref 120–384)

## 2016-01-29 LAB — FERRITIN: Ferritin: 231 ng/ml (ref 9–269)

## 2016-01-29 NOTE — Telephone Encounter (Addendum)
Patient aware of results  ----- Message from Eliezer Bottom, NP sent at 01/29/2016  9:12 AM EDT ----- Regarding: iron  Iron studies look great. No infusion needed. Thank you!  Sarah  ----- Message ----- From: Interface, Lab In Three Zero One Sent: 01/28/2016   1:38 PM To: Eliezer Bottom, NP

## 2016-01-30 ENCOUNTER — Ambulatory Visit: Payer: No Typology Code available for payment source | Admitting: Family

## 2016-01-30 ENCOUNTER — Other Ambulatory Visit: Payer: No Typology Code available for payment source

## 2016-02-03 ENCOUNTER — Encounter: Payer: Self-pay | Admitting: Family

## 2016-02-03 ENCOUNTER — Other Ambulatory Visit: Payer: Self-pay | Admitting: Family

## 2016-02-04 ENCOUNTER — Encounter: Payer: Self-pay | Admitting: Family Medicine

## 2016-02-18 ENCOUNTER — Ambulatory Visit: Payer: No Typology Code available for payment source | Admitting: Family Medicine

## 2016-03-22 ENCOUNTER — Ambulatory Visit: Payer: No Typology Code available for payment source | Admitting: Nurse Practitioner

## 2016-04-23 ENCOUNTER — Ambulatory Visit: Payer: No Typology Code available for payment source | Admitting: Nurse Practitioner

## 2016-04-29 ENCOUNTER — Other Ambulatory Visit (HOSPITAL_BASED_OUTPATIENT_CLINIC_OR_DEPARTMENT_OTHER): Payer: BC Managed Care – PPO

## 2016-04-29 ENCOUNTER — Ambulatory Visit (HOSPITAL_BASED_OUTPATIENT_CLINIC_OR_DEPARTMENT_OTHER): Payer: BC Managed Care – PPO | Admitting: Family

## 2016-04-29 VITALS — BP 125/73 | HR 59 | Temp 98.4°F | Wt 215.0 lb

## 2016-04-29 DIAGNOSIS — E611 Iron deficiency: Secondary | ICD-10-CM | POA: Diagnosis not present

## 2016-04-29 DIAGNOSIS — N920 Excessive and frequent menstruation with regular cycle: Secondary | ICD-10-CM

## 2016-04-29 DIAGNOSIS — D500 Iron deficiency anemia secondary to blood loss (chronic): Secondary | ICD-10-CM

## 2016-04-29 LAB — CBC WITH DIFFERENTIAL (CANCER CENTER ONLY)
BASO#: 0 10*3/uL (ref 0.0–0.2)
BASO%: 0.4 % (ref 0.0–2.0)
EOS%: 2.7 % (ref 0.0–7.0)
Eosinophils Absolute: 0.2 10*3/uL (ref 0.0–0.5)
HEMATOCRIT: 40 % (ref 34.8–46.6)
HGB: 13.5 g/dL (ref 11.6–15.9)
LYMPH#: 2.5 10*3/uL (ref 0.9–3.3)
LYMPH%: 34.1 % (ref 14.0–48.0)
MCH: 30.4 pg (ref 26.0–34.0)
MCHC: 33.8 g/dL (ref 32.0–36.0)
MCV: 90 fL (ref 81–101)
MONO#: 0.6 10*3/uL (ref 0.1–0.9)
MONO%: 8.6 % (ref 0.0–13.0)
NEUT#: 4 10*3/uL (ref 1.5–6.5)
NEUT%: 54.2 % (ref 39.6–80.0)
Platelets: 280 10*3/uL (ref 145–400)
RBC: 4.44 10*6/uL (ref 3.70–5.32)
RDW: 12.4 % (ref 11.1–15.7)
WBC: 7.4 10*3/uL (ref 3.9–10.0)

## 2016-04-29 NOTE — Progress Notes (Signed)
Hematology and Oncology Follow Up Visit  [name] [id number] 01-12-1976 41 y.o. 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 She is doing well and feeling much better.  Her iron studies at her last visit were stable and she has not required an infusion since September.  Her cycles are still quite heavy but regular.  She had some financial questions regarding billing and will see Baxter Flattery before she leaves.  She is happy to notice that her hair is starting to grow back in.  No fever, chills, n/v, cough, rash, dizziness, SOB, chest pain, palpitations, abdominal pain or changes in bowel or bladder habits. She has diarrhea off and on since having her gallbladder removed.  No swelling, tenderness, numbness or tingling in her extremities. No c/o pain. She has maintained a good appetite and is staying well hydrated. Her weight is stable.   Medications:  Allergies as of 04/29/2016      Reactions   Sulfa Antibiotics Hives      Medication List       Accurate as of 04/29/16  1:38 PM. Always use your most recent med list.          vitamin C 500 MG tablet Commonly known as:  ASCORBIC ACID Take 500 mg by mouth daily.   Vitamin D3 5000 units Caps Take 1 capsule by mouth daily.       Allergies:  Allergies  Allergen Reactions  . Sulfa Antibiotics Hives    Past Medical History, Surgical history, Social history, and Family History were reviewed and updated.  Review of Systems: All other 10 point review of systems is negative.   Physical Exam:  weight is 215 lb (97.5 kg). Her oral temperature is 98.4 F (36.9 C). Her blood pressure is 125/73 and her pulse is 59 (abnormal).   Wt Readings from Last 3 Encounters:  04/29/16 215 lb (97.5 kg)  01/28/16 216 lb (98 kg)  12/18/15 219 lb (99.3 kg)    Ocular:  Sclerae unicteric, pupils equal, round and reactive to light Ear-nose-throat: Oropharynx clear, dentition fair Lymphatic: No cervical supraclavicular or axillary adenopathy Lungs no rales or rhonchi, good excursion bilaterally Heart regular rate and rhythm, no murmur appreciated Abd soft, nontender, positive bowel sounds, no liver or spleen tip palpated on exam  MSK no focal spinal tenderness, no joint edema Neuro: non-focal, well-oriented, appropriate affect Breasts: Deferred  Lab Results  Component Value Date   WBC 7.4 04/29/2016   HGB 13.5 04/29/2016   HCT 40.0 04/29/2016   MCV 90 04/29/2016   PLT 280 04/29/2016   Lab Results  Component Value Date   FERRITIN 231 01/28/2016   IRON 129 01/28/2016   TIBC 229 (L) 01/28/2016   UIBC 100 (L) 01/28/2016   IRONPCTSAT 56 01/28/2016   Lab Results  Component Value Date   RBC 4.44 04/29/2016   No results found for: KPAFRELGTCHN, LAMBDASER, KAPLAMBRATIO No results found for: IGGSERUM, IGA, IGMSERUM No results found for: Odetta Pink, SPEI   Chemistry      Component Value Date/Time   NA 135 12/18/2015 0934   K 4.3 12/18/2015 0934   CL 104 12/18/2015 0934   CO2 28 12/18/2015 0934   BUN 9 12/18/2015 0934   CREATININE 1.1 12/18/2015 0934  Component Value Date/Time   CALCIUM 8.8 12/18/2015 0934   ALKPHOS 72 12/18/2015 0934   AST 18 12/18/2015 0934   ALT 13 12/18/2015 0934   BILITOT 0.80 12/18/2015 0934     Impression and Plan: [name] is a very pleasant 41 yo white female with history of iron deficiency secondary to menorrhagia. She has responded nicely to IV iron and her counts have improved. Her symptoms have resolved and she has no complaints at this time.  We will see what her iron studies from today show and bring her back in next for an infusion if needed.  We will plan to see her back in 4 months for repeat lab work and follow-up.  She will contact our office  with any questions or concerns. We can certainly see her sooner if need be.   Eliezer Bottom, NP 1/4/20181:38 PM

## 2016-04-30 ENCOUNTER — Telehealth: Payer: Self-pay | Admitting: *Deleted

## 2016-04-30 LAB — IRON AND TIBC
%SAT: 32 % (ref 21–57)
Iron: 86 ug/dL (ref 41–142)
TIBC: 270 ug/dL (ref 236–444)
UIBC: 184 ug/dL (ref 120–384)

## 2016-04-30 LAB — FERRITIN: FERRITIN: 96 ng/mL (ref 9–269)

## 2016-04-30 NOTE — Telephone Encounter (Addendum)
Patient aware of results  ----- Message from Eliezer Bottom, NP sent at 04/30/2016 12:42 PM EST ----- Regarding: Iron  Iron studies look good. No infusion needed! Thank you!  Sarah  ----- Message ----- From: Interface, Lab In Three Zero One Sent: 04/29/2016   1:20 PM To: Eliezer Bottom, NP

## 2016-05-04 ENCOUNTER — Encounter: Payer: Self-pay | Admitting: Family Medicine

## 2016-05-18 ENCOUNTER — Encounter: Payer: Self-pay | Admitting: Nurse Practitioner

## 2016-05-18 ENCOUNTER — Ambulatory Visit (INDEPENDENT_AMBULATORY_CARE_PROVIDER_SITE_OTHER): Payer: BC Managed Care – PPO | Admitting: Nurse Practitioner

## 2016-05-18 VITALS — BP 112/66 | HR 64 | Ht 63.0 in | Wt 217.0 lb

## 2016-05-18 DIAGNOSIS — Z01419 Encounter for gynecological examination (general) (routine) without abnormal findings: Secondary | ICD-10-CM | POA: Diagnosis not present

## 2016-05-18 DIAGNOSIS — Z0000 Encounter for general adult medical examination without abnormal findings: Secondary | ICD-10-CM | POA: Diagnosis not present

## 2016-05-18 DIAGNOSIS — R87619 Unspecified abnormal cytological findings in specimens from cervix uteri: Secondary | ICD-10-CM | POA: Diagnosis not present

## 2016-05-18 DIAGNOSIS — N920 Excessive and frequent menstruation with regular cycle: Secondary | ICD-10-CM

## 2016-05-18 DIAGNOSIS — E559 Vitamin D deficiency, unspecified: Secondary | ICD-10-CM

## 2016-05-18 DIAGNOSIS — Z113 Encounter for screening for infections with a predominantly sexual mode of transmission: Secondary | ICD-10-CM

## 2016-05-18 LAB — POCT URINALYSIS DIPSTICK
Bilirubin, UA: NEGATIVE
Glucose, UA: NEGATIVE
KETONES UA: NEGATIVE
LEUKOCYTES UA: NEGATIVE
Nitrite, UA: NEGATIVE
PH UA: 6
PROTEIN UA: NEGATIVE
Urobilinogen, UA: NEGATIVE

## 2016-05-18 NOTE — Progress Notes (Signed)
Patient ID: [name], female   DOB: 11/17/1975, 41 y.o.   MRN: [id number]  41 y.o. [id number] Separated Caucasian Fe here for annual exam.  Separated X 1 yr - he has lived in [place] since 10/2014 and has not lived at home for a long time before the split.   New partner for 1 yr.  Menses now are 25 - 30 days apart, flow for 4 days with spotting up to 5 days before and after cycle.  This partner is 10 yrs older and is divorced.  His children are grown.  Her children have not yet met him.  Patient's last menstrual period was 04/23/2016 (exact date).          Sexually active: Yes.    The current method of family planning is vasectomy.  New partner since last year.  Exercising: No.  The patient does not participate in regular exercise at present. 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 Not done but will get at age 23 --  Pt has no recollection of this being done TDaP:  06/27/15 HIV: 03/17/15 Labs: HB: Hematology  Urine: trace RBC   reports that she has never smoked. She has never used smokeless tobacco. She reports that she does not drink alcohol or use drugs.  Past Medical History:  Diagnosis Date  . Allergic rhinitis   . History of ectopic pregnancy 2009   left, tube preserved  . Iron deficiency 10/2015   w/out anemia.  Intol of oral iron.  Hematology saw her 12/25/15 and did feraheme x 2 doses.  Pt's symptoms abated nicely with this.  Recheck of iron studies 04/2016 were good--no infusion needed.  . Menorrhagia   . Obesity, Class II, BMI 35-39.9 09/29/2007   Qualifier: Diagnosis of  By: Wynona Luna   . TMJ pain dysfunction syndrome 2015   (causing otalgia/referred pain)-ENT is Dr. Redmond Baseman  . Vitamin D deficiency     Past Surgical History:  Procedure Laterality Date  . CESAREAN SECTION      2007 & 2010  . CHOLECYSTECTOMY     2004 (for  dysfunctional GB not for stones)  . COLONOSCOPY  10/18/2002   Severe diverticulosis from transverse to sigmoid colon, with increased spasm and tortuosity: likely cause of her urgency.  Recall 5 yrs (Dr. Lyla Son).  Marland Kitchen LAPAROSCOPY FOR ECTOPIC PREGNANCY  2009   salpingostomy  . LEEP  1999&2000  . TYMPANOSTOMY TUBE PLACEMENT  as a child    Current Outpatient Prescriptions  Medication Sig Dispense Refill  . Cholecalciferol (VITAMIN D3) 5000 units CAPS Take 1 capsule by mouth daily.    . vitamin C (ASCORBIC ACID) 500 MG tablet Take 500 mg by mouth daily.     No current facility-administered medications for this visit.     Family History  Problem Relation Age of Onset  . Bipolar disorder Mother   . Diabetes Mother   . Hypertension Father   . Peripheral vascular disease Maternal Grandmother     died during carotid surgery    ROS:  Pertinent items are noted in HPI.  Otherwise, a comprehensive ROS was negative.  Exam:   BP 112/66 (BP Location: Right Arm, Patient Position: Sitting, Cuff Size: Large)   Pulse 64   Ht 5' 3" (1.6 m)   Wt 217 lb (98.4 kg)   LMP  04/23/2016 (Exact Date)   BMI 38.44 kg/m  Height: 5' 3" (160 cm) Ht Readings from Last 3 Encounters:  05/18/16 5' 3" (1.6 m)  01/28/16 5' 3.25" (1.607 m)  12/18/15 5' 3.25" (1.607 m)    General appearance: alert, cooperative and appears stated age Head: Normocephalic, without obvious abnormality, atraumatic Neck: no adenopathy, supple, symmetrical, trachea midline and thyroid normal to inspection and palpation Lungs: clear to auscultation bilaterally Breasts: normal appearance, no masses or tenderness Heart: regular rate and rhythm Abdomen: soft, non-tender; no masses,  no organomegaly Extremities: extremities normal, atraumatic, no cyanosis or edema Skin: Skin color, texture, turgor normal. No rashes or lesions Lymph nodes: Cervical, supraclavicular, and axillary nodes normal. No abnormal inguinal nodes  palpated Neurologic: Grossly normal   Pelvic: External genitalia:  no lesions              Urethra:  normal appearing urethra with no masses, tenderness or lesions              Bartholin's and Skene's: normal                 Vagina: normal appearing vagina with normal color and discharge, no lesions              Cervix: anteverted              Pap taken: Yes.   per request Bimanual Exam:  Uterus:  normal size, contour, position, consistency, mobility, non-tender              Adnexa: no mass, fullness, tenderness               Rectovaginal: Confirms               Anus:  normal sphincter tone, no lesions  Chaperone present: yes  A:  Well Woman with normal exam  History of menorrhagia and anemia             Contraceptive - partner with vasectomy             Remote history of LEEP 1999 & 2000 - normal since             Mother with history of Vit B 12 def.  R/O STD's               P:   Reviewed health and wellness pertinent to exam  Pap smear was done - pt wants pap yearly  Follow with labs  Counseled on breast self exam, STD prevention, HIV risk factors and prevention, adequate intake of calcium and vitamin D, diet and exercise return annually or prn  An After Visit Summary was printed and given to the patient.

## 2016-05-18 NOTE — Patient Instructions (Signed)

## 2016-05-19 LAB — GC/CHLAMYDIA PROBE AMP
CT Probe RNA: NOT DETECTED
GC PROBE AMP APTIMA: NOT DETECTED

## 2016-05-19 LAB — VITAMIN D 25 HYDROXY (VIT D DEFICIENCY, FRACTURES): VIT D 25 HYDROXY: 21 ng/mL — AB (ref 30–100)

## 2016-05-19 LAB — STD PANEL
HIV 1&2 Ab, 4th Generation: NONREACTIVE
Hepatitis B Surface Ag: NEGATIVE

## 2016-05-19 LAB — IPS PAP TEST WITH HPV

## 2016-05-23 NOTE — Progress Notes (Signed)
Encounter reviewed by Dr. Aundria Rud. Colonoscopy was recommended to be 5 year follow up.  I recommend patient contact [facility] for follow up recommendations.

## 2016-05-25 ENCOUNTER — Encounter: Payer: Self-pay | Admitting: Family Medicine

## 2016-06-22 ENCOUNTER — Other Ambulatory Visit: Payer: Self-pay | Admitting: Nurse Practitioner

## 2016-06-22 ENCOUNTER — Telehealth: Payer: Self-pay | Admitting: Nurse Practitioner

## 2016-06-22 MED ORDER — VITAMIN D (ERGOCALCIFEROL) 1.25 MG (50000 UNIT) PO CAPS: 50000.0000 [IU] | ORAL_CAPSULE | ORAL | 3 refills | Status: DC

## 2016-06-22 NOTE — Telephone Encounter (Deleted)
Pt called about colonoscopy and we suggest that she call [facility] and get their information about repeat exam.  Both mother and husband does

## 2016-06-22 NOTE — Telephone Encounter (Signed)
Pt is informed to call [facility] to discuss with them when to get next colonoscopy.  Pt has discussed with mother and husband and they do not recall her getting colonoscopy as well.  They recall the endoscopy.  She will call and get that done.  Also RX for Vit D has been renewed and sent to pharmacy as she was out of previous RX.

## 2016-06-22 NOTE — Progress Notes (Signed)
Pt is informed to call [facility] to discuss with them when to get next colonoscopy.  Pt has discussed with mother and husband and they do not recall her getting colonoscopy as well.  They recall the endoscopy.  She will call and get that done.  Also RX for Vit D has been renewed and sent to pharmacy as she was out of previous RX.

## 2016-08-27 ENCOUNTER — Ambulatory Visit: Payer: BC Managed Care – PPO | Admitting: Family

## 2016-08-27 ENCOUNTER — Other Ambulatory Visit: Payer: BC Managed Care – PPO

## 2016-08-31 ENCOUNTER — Ambulatory Visit: Payer: BC Managed Care – PPO | Admitting: Family

## 2016-08-31 ENCOUNTER — Other Ambulatory Visit (HOSPITAL_BASED_OUTPATIENT_CLINIC_OR_DEPARTMENT_OTHER): Payer: BC Managed Care – PPO

## 2016-08-31 DIAGNOSIS — N920 Excessive and frequent menstruation with regular cycle: Secondary | ICD-10-CM | POA: Diagnosis not present

## 2016-08-31 DIAGNOSIS — D509 Iron deficiency anemia, unspecified: Secondary | ICD-10-CM | POA: Diagnosis not present

## 2016-08-31 DIAGNOSIS — E611 Iron deficiency: Secondary | ICD-10-CM

## 2016-08-31 LAB — CBC WITH DIFFERENTIAL/PLATELET
BASO%: 0.5 % (ref 0.0–2.0)
BASOS ABS: 0 10*3/uL (ref 0.0–0.1)
EOS%: 2.1 % (ref 0.0–7.0)
Eosinophils Absolute: 0.2 10*3/uL (ref 0.0–0.5)
HEMATOCRIT: 39.6 % (ref 34.8–46.6)
HGB: 13.1 g/dL (ref 11.6–15.9)
LYMPH#: 2.7 10*3/uL (ref 0.9–3.3)
LYMPH%: 36.1 % (ref 14.0–49.7)
MCH: 29.1 pg (ref 25.1–34.0)
MCHC: 33.1 g/dL (ref 31.5–36.0)
MCV: 88 fL (ref 79.5–101.0)
MONO#: 0.5 10*3/uL (ref 0.1–0.9)
MONO%: 7.2 % (ref 0.0–14.0)
NEUT#: 4.1 10*3/uL (ref 1.5–6.5)
NEUT%: 54.1 % (ref 38.4–76.8)
Platelets: 229 10*3/uL (ref 145–400)
RBC: 4.5 10*6/uL (ref 3.70–5.45)
RDW: 13.2 % (ref 11.2–14.5)
WBC: 7.5 10*3/uL (ref 3.9–10.3)

## 2016-09-01 ENCOUNTER — Encounter: Payer: Self-pay | Admitting: *Deleted

## 2016-09-01 LAB — FERRITIN: Ferritin: 37 ng/ml (ref 9–269)

## 2016-09-01 LAB — IRON AND TIBC
%SAT: 22 % (ref 21–57)
Iron: 72 ug/dL (ref 41–142)
TIBC: 319 ug/dL (ref 236–444)
UIBC: 247 ug/dL (ref 120–384)

## 2016-09-07 ENCOUNTER — Ambulatory Visit: Payer: BC Managed Care – PPO | Admitting: Family

## 2016-09-13 ENCOUNTER — Ambulatory Visit (HOSPITAL_BASED_OUTPATIENT_CLINIC_OR_DEPARTMENT_OTHER): Payer: BC Managed Care – PPO | Admitting: Family

## 2016-09-13 VITALS — BP 112/76 | HR 68 | Temp 98.3°F | Resp 18 | Wt 215.1 lb

## 2016-09-13 DIAGNOSIS — E611 Iron deficiency: Secondary | ICD-10-CM

## 2016-09-13 DIAGNOSIS — D500 Iron deficiency anemia secondary to blood loss (chronic): Secondary | ICD-10-CM | POA: Diagnosis not present

## 2016-09-13 DIAGNOSIS — N920 Excessive and frequent menstruation with regular cycle: Secondary | ICD-10-CM | POA: Diagnosis not present

## 2016-09-13 NOTE — Progress Notes (Signed)
Hematology and Oncology Follow Up Visit  [name] [id number] 27-Oct-1975 41 y.o. 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 She is symptomatic with fatigue, palpitations, head is itching, dizziness and "brain fog." She just finished her cycle ad states that it was extremely heavy with lots of clots.  Her iron studies were drawn earlier this month prior to her cycle. Her ferritin was down to 37 and iron saturation was 22%. Hgb was stale at 13.1.  With her cycles being so heavy I suggested she follow up with her gynecologist and see what her options are for treatment other than birth control. She really does nt want to be on an oral contraceptive due to it making her "feel weird." She has had no fever, chills, n/v, cough, SOB, chest pain, abdominal pain or changes in bowel or bladder habits.  No swelling, tenderness, numbness or tingling in her extremities. No c/o pain.  She has maintained a good appetite and is staying well hydrated. Her weight is stable.   ECOG Performance Status: 1 - Symptomatic but completely ambulatory  Medications:  Allergies as of 09/13/2016      Reactions   Sulfa Antibiotics Hives      Medication List       Accurate as of 09/13/16  3:57 PM. Always use your most recent med list.          vitamin C 500 MG tablet Commonly known as:  ASCORBIC ACID Take 500 mg by mouth daily.   Vitamin D (Ergocalciferol) 50000 units Caps capsule Commonly known as:  DRISDOL Take 1 capsule (50,000 Units total) by mouth every 7 (seven) days.   Vitamin D3 5000 units Caps Take 1 capsule by mouth daily.       Allergies:  Allergies  Allergen Reactions  . Sulfa Antibiotics Hives    Past Medical History, Surgical history, Social history, and Family History were reviewed and updated.  Review of  Systems: All other 10 point review of systems is negative.   Physical Exam:  weight is 215 lb 1.3 oz (97.6 kg).   Wt Readings from Last 3 Encounters:  09/13/16 215 lb 1.3 oz (97.6 kg)  05/18/16 217 lb (98.4 kg)  04/29/16 215 lb (97.5 kg)    Ocular: Sclerae unicteric, pupils equal, round and reactive to light Ear-nose-throat: Oropharynx clear, dentition fair Lymphatic: No cervical, supraclavicular or axillary adenopathy Lungs no rales or rhonchi, good excursion bilaterally Heart regular rate and rhythm, no murmur appreciated Abd soft, nontender, positive bowel sounds, no liver or spleen tip palpated on exam, no fluid wave MSK no focal spinal tenderness, no joint edema Neuro: non-focal, well-oriented, appropriate affect Breasts: Deferred   Lab Results  Component Value Date   WBC 7.5 08/31/2016   HGB 13.1 08/31/2016   HCT 39.6 08/31/2016   MCV 88.0 08/31/2016   PLT 229 08/31/2016   Lab Results  Component Value Date   FERRITIN 37 08/31/2016   IRON 72 08/31/2016   TIBC 319 08/31/2016   UIBC 247 08/31/2016   IRONPCTSAT 22 08/31/2016   Lab Results  Component Value Date   RBC 4.50 08/31/2016   No results found for: KPAFRELGTCHN, LAMBDASER, KAPLAMBRATIO No results found for: IGGSERUM, IGA, IGMSERUM No results found for: TOTALPROTELP, ALBUMINELP, A1GS, A2GS, BETS, BETA2SER, [place], [place], SPEI   Chemistry  Component Value Date/Time   NA 135 12/18/2015 0934   K 4.3 12/18/2015 0934   CL 104 12/18/2015 0934   CO2 28 12/18/2015 0934   BUN 9 12/18/2015 0934   CREATININE 1.1 12/18/2015 0934      Component Value Date/Time   CALCIUM 8.8 12/18/2015 0934   ALKPHOS 72 12/18/2015 0934   AST 18 12/18/2015 0934   ALT 13 12/18/2015 0934   BILITOT 0.80 12/18/2015 0934      Impression and Plan: [name] is a very pleasant 41 yo caucasian female with iron deficiency anemia secondary to chronic blood loss with heavy cycles.  Iron saturation earlier this month was 22% and  ferritin 37. Since then, he has had a heavy cycle and is now symptomatic with fatigue, palpitations, dizziness and brain fog.  We will recheck her iron studies later this week and bring her back in for an infusion if needed.  We will plan to see her back in 2 months for follow-up and lab work in 2 months.  She will contact our office with any questions or concerns Birth weight not on file can certainly see her sooner if need be.   Eliezer Bottom, NP 5/21/20183:57 PM

## 2016-09-16 ENCOUNTER — Encounter: Payer: Self-pay | Admitting: Family Medicine

## 2016-09-21 ENCOUNTER — Other Ambulatory Visit: Payer: BC Managed Care – PPO

## 2016-10-07 ENCOUNTER — Other Ambulatory Visit (HOSPITAL_BASED_OUTPATIENT_CLINIC_OR_DEPARTMENT_OTHER): Payer: BC Managed Care – PPO

## 2016-10-07 DIAGNOSIS — N920 Excessive and frequent menstruation with regular cycle: Secondary | ICD-10-CM

## 2016-10-07 DIAGNOSIS — D509 Iron deficiency anemia, unspecified: Secondary | ICD-10-CM

## 2016-10-07 DIAGNOSIS — E611 Iron deficiency: Secondary | ICD-10-CM

## 2016-10-07 LAB — CBC WITH DIFFERENTIAL/PLATELET
BASO%: 0.3 % (ref 0.0–2.0)
Basophils Absolute: 0 10*3/uL (ref 0.0–0.1)
EOS%: 2 % (ref 0.0–7.0)
Eosinophils Absolute: 0.1 10*3/uL (ref 0.0–0.5)
HCT: 38 % (ref 34.8–46.6)
HEMOGLOBIN: 12.7 g/dL (ref 11.6–15.9)
LYMPH%: 34.4 % (ref 14.0–49.7)
MCH: 29.1 pg (ref 25.1–34.0)
MCHC: 33.4 g/dL (ref 31.5–36.0)
MCV: 87 fL (ref 79.5–101.0)
MONO#: 0.4 10*3/uL (ref 0.1–0.9)
MONO%: 5.3 % (ref 0.0–14.0)
NEUT%: 58 % (ref 38.4–76.8)
NEUTROS ABS: 4 10*3/uL (ref 1.5–6.5)
Platelets: 266 10*3/uL (ref 145–400)
RBC: 4.37 10*6/uL (ref 3.70–5.45)
RDW: 12.9 % (ref 11.2–14.5)
WBC: 6.8 10*3/uL (ref 3.9–10.3)
lymph#: 2.4 10*3/uL (ref 0.9–3.3)

## 2016-10-07 LAB — IRON AND TIBC
%SAT: 12 % — AB (ref 21–57)
Iron: 38 ug/dL — ABNORMAL LOW (ref 41–142)
TIBC: 308 ug/dL (ref 236–444)
UIBC: 270 ug/dL (ref 120–384)

## 2016-10-07 LAB — FERRITIN: FERRITIN: 23 ng/mL (ref 9–269)

## 2016-10-11 ENCOUNTER — Telehealth: Payer: Self-pay | Admitting: *Deleted

## 2016-10-11 NOTE — Telephone Encounter (Addendum)
Patient is aware of results. Appointment made.  ----- Message from Eliezer Bottom, NP sent at 10/07/2016  5:01 PM EDT ----- Regarding: Iron  Iron saturation low. Needs one dose of IV iron next week please. LOS sent to [facility] Thank you.   Sarah  ----- Message ----- From: Interface, Lab In Three Zero One Sent: 10/07/2016   2:46 PM To: Eliezer Bottom, NP

## 2016-10-22 ENCOUNTER — Ambulatory Visit: Payer: BC Managed Care – PPO

## 2016-10-28 ENCOUNTER — Ambulatory Visit: Payer: BC Managed Care – PPO

## 2016-11-22 ENCOUNTER — Other Ambulatory Visit: Payer: BC Managed Care – PPO

## 2016-11-24 ENCOUNTER — Ambulatory Visit: Payer: BC Managed Care – PPO | Admitting: Family

## 2016-11-26 ENCOUNTER — Telehealth: Payer: Self-pay | Admitting: Hematology

## 2016-11-26 ENCOUNTER — Other Ambulatory Visit (HOSPITAL_BASED_OUTPATIENT_CLINIC_OR_DEPARTMENT_OTHER): Payer: BC Managed Care – PPO

## 2016-11-26 ENCOUNTER — Encounter: Payer: Self-pay | Admitting: Hematology

## 2016-11-26 ENCOUNTER — Inpatient Hospital Stay: Payer: BC Managed Care – PPO | Attending: Hematology | Admitting: Hematology

## 2016-11-26 VITALS — BP 123/77 | HR 79 | Temp 98.8°F | Resp 18 | Ht 63.0 in | Wt 225.1 lb

## 2016-11-26 DIAGNOSIS — N920 Excessive and frequent menstruation with regular cycle: Secondary | ICD-10-CM | POA: Diagnosis not present

## 2016-11-26 DIAGNOSIS — E611 Iron deficiency: Secondary | ICD-10-CM | POA: Diagnosis not present

## 2016-11-26 LAB — CBC & DIFF AND RETIC
BASO%: 0.4 % (ref 0.0–2.0)
Basophils Absolute: 0 10*3/uL (ref 0.0–0.1)
EOS%: 2 % (ref 0.0–7.0)
Eosinophils Absolute: 0.1 10*3/uL (ref 0.0–0.5)
HEMATOCRIT: 40.3 % (ref 34.8–46.6)
HGB: 13.6 g/dL (ref 11.6–15.9)
Immature Retic Fract: 2.6 % (ref 1.60–10.00)
LYMPH%: 30.2 % (ref 14.0–49.7)
MCH: 29.1 pg (ref 25.1–34.0)
MCHC: 33.7 g/dL (ref 31.5–36.0)
MCV: 86.3 fL (ref 79.5–101.0)
MONO#: 0.6 10*3/uL (ref 0.1–0.9)
MONO%: 8 % (ref 0.0–14.0)
NEUT#: 4.2 10*3/uL (ref 1.5–6.5)
NEUT%: 59.4 % (ref 38.4–76.8)
PLATELETS: 222 10*3/uL (ref 145–400)
RBC: 4.67 10*6/uL (ref 3.70–5.45)
RDW: 13.2 % (ref 11.2–14.5)
RETIC CT ABS: 45.77 10*3/uL (ref 33.70–90.70)
Retic %: 0.98 % (ref 0.70–2.10)
WBC: 7.1 10*3/uL (ref 3.9–10.3)
lymph#: 2.2 10*3/uL (ref 0.9–3.3)
nRBC: 0 % (ref 0–0)

## 2016-11-26 LAB — IRON AND TIBC
%SAT: 35 % (ref 21–57)
Iron: 112 ug/dL (ref 41–142)
TIBC: 319 ug/dL (ref 236–444)
UIBC: 207 ug/dL (ref 120–384)

## 2016-11-26 LAB — FERRITIN: Ferritin: 33 ng/ml (ref 9–269)

## 2016-11-26 NOTE — Progress Notes (Signed)
[place]  Telephone:([phone]) [phone] Fax:([phone]) [phone]  Clinic Follow up Note   Patient Care Team: Tammi Sou, MD as PCP - General (Family Medicine) Harriet Masson, DPM as Consulting Physician (Podiatry) Elveria Rising, MD as Consulting Physician (Obstetrics and Gynecology) Volanda Napoleon, MD as Consulting Physician (Oncology) 11/26/2016  CHIEF COMPLAINTS: Iron Deficiency Anemia   HISTORY OF PRESENTING ILLNESS:  [name] 41 y.o. female is here because of diagnosed iron deficiency anemia secondary to menorrhagia. She previously was on to my partner Dr. Antonieta Pert care at [facility], and is transferring her care to our clinic due to convenient location.  Dating back to 10/2015, she began noticing extreme hair loss which was related to her iron deficiency by Dr. Anitra Lauth. Per her notes, she began taking 520 mg iron tablet, bid, but reports it was not tolerable and caused abdominal pain and either constipation or diarrhea. Subsequently, she began receiving IV iron as a substitute.   Her most recent appointment was with Dr.[place] who notes that [facility] visited her office symptomatic with fatigue, palpitations, head itching, dizziness, and "brain fog." She denied any fever ,chills, SOB, chest pain, numbness/tingling in her extremities or changes in bowel movements. Her menstrual cycle was extremely heavy with lots of clots. Her cycles are about 7 days with 4 days being extremely heavy. She has to use both pads and tampons. Labs from earlier that month showed ferritin down to 37, iron saturation of 22% and hemoglobin at 13.1  Received one dose of IV iron the week of 10/18/2016. After her IV iron, her hair is slowly growing again, resolving joint pain and heart palpitations.  Today, she presents to the clinic to discuss further treatment for her iron deficiency anemia. Other than heavy menstrual bleeding, she denies any other bleedings or other diagnosed illnesses.    MEDICAL HISTORY:  Past Medical History:  Diagnosis Date  . Allergic rhinitis   . History of ectopic pregnancy 2009   left, tube preserved  . Iron deficiency 10/2015   w/out anemia.  Intol of oral iron.  Hematology saw her 12/25/15 and did feraheme x 2 doses.  Pt's symptoms abated nicely with this.  Recheck of iron studies 04/2016 were good--no infusion needed.  Ongoing f/u with hematology.  . Menorrhagia    needs to discuss tx options other than oral contraceptives with her GYN.  . Obesity, Class II, BMI 35-39.9 09/29/2007   Qualifier: Diagnosis of  By: Wynona Luna   . TMJ pain dysfunction syndrome 2015   (causing otalgia/referred pain)-ENT is Dr. Redmond Baseman  . Vitamin D deficiency     SURGICAL HISTORY: Past Surgical History:  Procedure Laterality Date  . CESAREAN SECTION      2007 & 2010  . CHOLECYSTECTOMY     2004 (for dysfunctional GB not for stones)  . COLONOSCOPY  10/18/2002   Severe diverticulosis from transverse to sigmoid colon, with increased spasm and tortuosity: likely cause of her urgency.  Recall 5 yrs (Dr. Lyla Son).  Marland Kitchen LAPAROSCOPY FOR ECTOPIC PREGNANCY  07/26/2007   salpingostomy for ectopic  . 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 TYMPANOSTOMY TUBE PLACEMENT  as a child    SOCIAL HISTORY: Social History   Social History  . Marital status: Married    Spouse name: N/A  . Number of children: 2  . Years of education: N/A   Occupational History  .  Homemaker   Social History Main Topics  .  Smoking status: Never Smoker  . Smokeless tobacco: Never Used  . Alcohol use No  . Drug use: No  . Sexual activity: Yes    Partners: Male    Birth control/ protection: Condom   Other Topics Concern  . Not on file   Social History Narrative   Married--separated.   College graduate.   Homemaker.   1 daughter   1 son   No T/A/Ds.                   FAMILY HISTORY: Family History  Problem Relation Age of Onset  . Bipolar disorder Mother   . Diabetes  Mother   . Hypertension Father   . Peripheral vascular disease Maternal Grandmother        died during carotid surgery    ALLERGIES:  is allergic to sulfa antibiotics.  MEDICATIONS:  Current Outpatient Prescriptions  Medication Sig Dispense Refill  . Vitamin D, Ergocalciferol, (DRISDOL) 50000 units CAPS capsule Take 1 capsule (50,000 Units total) by mouth every 7 (seven) days. 30 capsule 3   No current facility-administered medications for this visit.     REVIEW OF SYSTEMS:   Constitutional: Denies fevers, chills or abnormal night sweats (+)ocacasional hair loss Eyes: Denies blurriness of vision, double vision or watery eyes Ears, nose, mouth, throat, and face: Denies mucositis or sore throat Respiratory: Denies cough, dyspnea or wheezes Cardiovascular: Denies palpitation, chest discomfort or lower extremity swelling Gastrointestinal:  Denies nausea, heartburn or change in bowel habits Skin: Denies abnormal skin rashes Lymphatics: Denies new lymphadenopathy or easy bruising Neurological:Denies numbness, tingling or new weaknesses Behavioral/Psych: Mood is stable, no new changes  All other systems were reviewed with the patient and are negative.  PHYSICAL EXAMINATION:  ECOG PERFORMANCE STATUS: 0 - Asymptomatic  Vitals:   11/26/16 1059  BP: 123/77  Pulse: 79  Resp: 18  Temp: 98.8 F (37.1 C)   Filed Weights   11/26/16 1059  Weight: 225 lb 1.6 oz (102.1 kg)    GENERAL:alert, no distress and comfortable SKIN: skin color, texture, turgor are normal, no rashes or significant lesions EYES: normal, conjunctiva are pink and non-injected, sclera clear OROPHARYNX:no exudate, no erythema and lips, buccal mucosa, and tongue normal  NECK: supple, thyroid normal size, non-tender, without nodularity LYMPH:  no palpable lymphadenopathy in the cervical, axillary or inguinal LUNGS: clear to auscultation and percussion with normal breathing effort HEART: regular rate & rhythm and no  murmurs and no lower extremity edema ABDOMEN:abdomen soft, non-tender and normal bowel sounds Musculoskeletal:no cyanosis of digits and no clubbing  PSYCH: alert & oriented x 3 with fluent speech NEURO: no focal motor/sensory deficits  LABORATORY DATA:  I have reviewed the data as listed CBC Latest Ref Rng & Units 11/26/2016 10/07/2016 08/31/2016  WBC 3.9 - 10.3 10e3/uL 7.1 6.8 7.5  Hemoglobin 11.6 - 15.9 g/dL 13.6 12.7 13.1  Hematocrit 34.8 - 46.6 % 40.3 38.0 39.6  Platelets 145 - 400 10e3/uL 222 266 229    CMP Latest Ref Rng & Units 12/18/2015 03/17/2015 10/07/2014  Glucose 73 - 118 mg/dL 90 83 113(H)  BUN 7 - 22 mg/dL 9 11 13   Creatinine 0.6 - 1.2 mg/dl 1.1 0.77 0.90  Sodium 128 - 145 mEq/L 135 141 138  Potassium 3.3 - 4.7 mEq/L 4.3 5.1 4.5  Chloride 98 - 108 mEq/L 104 107 105  CO2 18 - 33 mEq/L 28 27 28   Calcium 8.0 - 10.3 mg/dL 8.8 9.0 9.2  Total Protein 6.4 -  8.1 g/dL 6.6 6.2 6.4  Total Bilirubin 0.20 - 1.60 mg/dl 0.80 0.3 0.5  Alkaline Phos 26 - 84 U/L 72 66 57  AST 11 - 38 U/L 18 14 16   ALT 10 - 47 U/L 13 8 11    Results for TRISTY, UDOVICH (MRN [id number]) as of 11/26/2016 22:45  Ref. Range 08/31/2016 15:48 10/07/2016 14:25 11/26/2016 10:44  Iron Latest Ref Range: 41 - 142 ug/dL 72 38 (L) 112  UIBC Latest Ref Range: 120 - 384 ug/dL 247 270 207  TIBC Latest Ref Range: 236 - 444 ug/dL 319 308 319  %SAT Latest Ref Range: 21 - 57 % 22 12 (L) 35  Ferritin Latest Ref Range: 9 - 269 ng/ml 37 23 33    RADIOGRAPHIC STUDIES: I have personally reviewed the radiological images as listed and agreed with the findings in the report. No results found.  ASSESSMENT & PLAN: 41 year old Caucasian female  1. Iron Deficiency secondary to menorrhagia -I have reviewed her previous chart including laboratory results in details. -Her initial lab work was consistent with iron deficiency, no anemia. Her B12 level and erythropoietin level was normal, hemoglobin electrophoresis was normal. -Her iron  deficiency is secondary to menorrhagia, no clinical suspicions for other bleeding. -Also she is not anemic, she is symptomatic from iron deficiency, including hair loss, palpitation and fatigue. -She cannot tolerate oral iron pills. She previously responded well to IV our in 2017. -Her iron study from June 2018 showed low serum iron and transferrin saturation, low ferritin level. -I'll repeat her iron study today. We'll set up IV ferriheme weekly twice for the next few weeks. -Continue monitoring her I'll level every 3-4 months. - Patient is aware to call if she has any changes or experiences anything abnormal - I encouraged her to eat a more iron based diet, including more red meat -I encouraged her to discuss with her gynecologist about treatment options for her menorrhagia.  -I'll see her back in one year.  PLAN - schedule IV Ferriheme twice weekly, starting next week - labs every 3-4 months - f/u in 1 year  No problem-specific Assessment & Plan notes found for this encounter.   All questions were answered. The patient knows to call the clinic with any problems, questions or concerns. I spent 25 minutes counseling the patient face to face. The total time spent in the appointment was 30 minutes and more than 50% was on counseling.    This document serves as a record of services personally performed by Truitt Merle, MD. It was created on her behalf by Brandt Loosen, a trained medical scribe. The creation of this record is based on the scribe's personal observations and the provider's statements to them. This document has been checked and approved by the attending provider.  Truitt Merle  11/26/2016

## 2016-11-26 NOTE — Telephone Encounter (Signed)
Gave patient avs form and calendars for appointments.

## 2016-12-01 ENCOUNTER — Ambulatory Visit (HOSPITAL_BASED_OUTPATIENT_CLINIC_OR_DEPARTMENT_OTHER): Payer: BC Managed Care – PPO

## 2016-12-01 VITALS — BP 117/65 | HR 64 | Temp 98.6°F | Resp 18

## 2016-12-01 DIAGNOSIS — N920 Excessive and frequent menstruation with regular cycle: Secondary | ICD-10-CM

## 2016-12-01 DIAGNOSIS — E611 Iron deficiency: Secondary | ICD-10-CM | POA: Diagnosis not present

## 2016-12-01 MED ORDER — SODIUM CHLORIDE 0.9 % IV SOLN
510.0000 mg | Freq: Once | INTRAVENOUS | Status: AC
  Administered 2016-12-01: 510 mg via INTRAVENOUS
  Filled 2016-12-01: qty 17

## 2016-12-01 MED ORDER — SODIUM CHLORIDE 0.9 % IV SOLN
Freq: Once | INTRAVENOUS | Status: AC
  Administered 2016-12-01: 10:00:00 via INTRAVENOUS

## 2016-12-01 NOTE — Patient Instructions (Signed)

## 2016-12-02 ENCOUNTER — Telehealth: Payer: Self-pay | Admitting: Obstetrics and Gynecology

## 2016-12-02 NOTE — Telephone Encounter (Signed)
Left message regarding upcoming appointment has been canceled and needs to be rescheduled. °

## 2016-12-07 ENCOUNTER — Ambulatory Visit (HOSPITAL_BASED_OUTPATIENT_CLINIC_OR_DEPARTMENT_OTHER): Payer: BC Managed Care – PPO

## 2016-12-07 VITALS — BP 97/59 | HR 73 | Temp 98.2°F | Resp 20

## 2016-12-07 DIAGNOSIS — E611 Iron deficiency: Secondary | ICD-10-CM | POA: Diagnosis not present

## 2016-12-07 DIAGNOSIS — N920 Excessive and frequent menstruation with regular cycle: Secondary | ICD-10-CM

## 2016-12-07 MED ORDER — SODIUM CHLORIDE 0.9 % IV SOLN
510.0000 mg | Freq: Once | INTRAVENOUS | Status: AC
  Administered 2016-12-07: 510 mg via INTRAVENOUS
  Filled 2016-12-07: qty 17

## 2016-12-07 MED ORDER — SODIUM CHLORIDE 0.9 % IV SOLN
Freq: Once | INTRAVENOUS | Status: AC
  Administered 2016-12-07: 15:00:00 via INTRAVENOUS

## 2016-12-07 NOTE — Patient Instructions (Signed)

## 2017-02-24 ENCOUNTER — Ambulatory Visit: Payer: BC Managed Care – PPO | Admitting: Family Medicine

## 2017-02-28 ENCOUNTER — Encounter: Payer: Self-pay | Admitting: Family Medicine

## 2017-03-02 ENCOUNTER — Other Ambulatory Visit (HOSPITAL_BASED_OUTPATIENT_CLINIC_OR_DEPARTMENT_OTHER): Payer: BC Managed Care – PPO

## 2017-03-02 DIAGNOSIS — D509 Iron deficiency anemia, unspecified: Secondary | ICD-10-CM | POA: Diagnosis not present

## 2017-03-02 DIAGNOSIS — E611 Iron deficiency: Secondary | ICD-10-CM

## 2017-03-02 LAB — CBC & DIFF AND RETIC
BASO%: 0.6 % (ref 0.0–2.0)
Basophils Absolute: 0 10*3/uL (ref 0.0–0.1)
EOS%: 1.7 % (ref 0.0–7.0)
Eosinophils Absolute: 0.1 10*3/uL (ref 0.0–0.5)
HEMATOCRIT: 39.6 % (ref 34.8–46.6)
HGB: 13.5 g/dL (ref 11.6–15.9)
Immature Retic Fract: 1.5 % — ABNORMAL LOW (ref 1.60–10.00)
LYMPH#: 2.5 10*3/uL (ref 0.9–3.3)
LYMPH%: 36.7 % (ref 14.0–49.7)
MCH: 30.5 pg (ref 25.1–34.0)
MCHC: 34.1 g/dL (ref 31.5–36.0)
MCV: 89.4 fL (ref 79.5–101.0)
MONO#: 0.4 10*3/uL (ref 0.1–0.9)
MONO%: 5.8 % (ref 0.0–14.0)
NEUT%: 55.2 % (ref 38.4–76.8)
NEUTROS ABS: 3.8 10*3/uL (ref 1.5–6.5)
Platelets: 234 10*3/uL (ref 145–400)
RBC: 4.43 10*6/uL (ref 3.70–5.45)
RDW: 13.4 % (ref 11.2–14.5)
RETIC %: 1.23 % (ref 0.70–2.10)
Retic Ct Abs: 54.49 10*3/uL (ref 33.70–90.70)
WBC: 6.9 10*3/uL (ref 3.9–10.3)

## 2017-03-03 LAB — IRON AND TIBC
%SAT: 23 % (ref 21–57)
IRON: 57 ug/dL (ref 41–142)
TIBC: 251 ug/dL (ref 236–444)
UIBC: 194 ug/dL (ref 120–384)

## 2017-03-03 LAB — FERRITIN: FERRITIN: 118 ng/mL (ref 9–269)

## 2017-05-24 ENCOUNTER — Ambulatory Visit: Payer: BC Managed Care – PPO | Admitting: Nurse Practitioner

## 2017-11-28 NOTE — Progress Notes (Signed)
[place]  [phone] [phone]  Clinic Follow up Note   Patient Care Team: Tammi Sou, MD as PCP - General (Family Medicine) Harriet Masson, DPM as Consulting Physician (Podiatry) Elveria Rising, MD as Consulting Physician (Obstetrics and Gynecology) Volanda Napoleon, MD as Consulting Physician (Oncology) Truitt Merle, MD as Consulting Physician (Hematology) 12/01/2017  CHIEF COMPLAINTS: Iron Deficiency Anemia   HISTORY OF PRESENTING ILLNESS:  [name] 42 y.o. female is here because of diagnosed iron deficiency anemia secondary to menorrhagia. She previously was on to my partner Dr. Antonieta Pert care at [facility], and is transferring her care to our clinic due to convenient location.  Dating back to 10/2015, she began noticing extreme hair loss which was related to her iron deficiency by Dr. Anitra Lauth. Per her notes, she began taking 520 mg iron tablet, bid, but reports it was not tolerable and caused abdominal pain and either constipation or diarrhea. Subsequently, she began receiving IV iron as a substitute.   Her most recent appointment was with Dr.[place] who notes that [facility] visited her office symptomatic with fatigue, palpitations, head itching, dizziness, and "brain fog." She denied any fever ,chills, SOB, chest pain, numbness/tingling in her extremities or changes in bowel movements. Her menstrual cycle was extremely heavy with lots of clots. Her cycles are about 7 days with 4 days being extremely heavy. She has to use both pads and tampons. Labs from earlier that month showed ferritin down to 37, iron saturation of 22% and hemoglobin at 13.1  Received one dose of IV iron the week of 10/18/2016. After her IV iron, her hair is slowly growing again, resolving joint pain and heart palpitations.  Today, she presents to the clinic to discuss further treatment for her iron deficiency anemia. Other than heavy menstrual bleeding, she denies  any other bleedings or other diagnosed illnesses.    CURRENT THERAPY : IV Feraheme 510mg  as needed    INTERVAL HISTORY [name] is a 42 y.o. female who is here for follow up on IDA. She is here with her children. She feels good. She reports that she feels restless and experiences palpitations when her iron levels drop. She no longer has hair loss or thinning. Her menses are at least 7 days with spotting at the beginning and end. They are frequent and still heavy. She doesn't know if she has fibroids. She needs to find a new OB/GYNE doctor because her previous one retired.  She reports having low vitamin D and is therefore taking a supplement.  MEDICAL HISTORY:  Past Medical History:  Diagnosis Date  . Allergic rhinitis   . History of ectopic pregnancy 2009   left, tube preserved  . Iron deficiency 10/2015   w/out anemia (from pt's menorrhagia).  Intol of oral iron.  Hematology saw her 12/25/15 and did feraheme x 2 doses.  Pt's symptoms abated nicely with this.  Recheck of iron studies 04/2016 were good--no infusion needed.  Ongoing f/u with hematology: 11/2016--Dr. Burr Medico started regularly scheduled feraheme treatments.  . Menorrhagia    needs to discuss tx options other than oral contraceptives with her GYN.  . Obesity, Class II, BMI 35-39.9 09/29/2007   Qualifier: Diagnosis of  By: Wynona Luna   . TMJ pain dysfunction syndrome 2015   (causing otalgia/referred pain)-ENT is Dr. Redmond Baseman  . Vitamin D deficiency     SURGICAL HISTORY: Past Surgical History:  Procedure Laterality Date  . CESAREAN SECTION  2007 & 2010  . CHOLECYSTECTOMY     2004 (for dysfunctional GB not for stones)  . COLONOSCOPY  10/18/2002   Severe diverticulosis from transverse to sigmoid colon, with increased spasm and tortuosity: likely cause of her urgency.  Recall 5 yrs (Dr. Lyla Son).  Marland Kitchen LAPAROSCOPY FOR ECTOPIC PREGNANCY  07/26/2007   salpingostomy for ectopic  . LEEP  12/1997 & 04/1998   CIN III    . TYMPANOSTOMY TUBE PLACEMENT  as a child    SOCIAL HISTORY: Social History   Socioeconomic History  . Marital status: Married    Spouse name: Not on file  . Number of children: 2  . Years of education: Not on file  . Highest education level: Not on file  Occupational History    Employer: [employer]  . Financial resource strain: Not on file  . Food insecurity:    Worry: Not on file    Inability: Not on file  . Transportation needs:    Medical: Not on file    Non-medical: Not on file  Tobacco Use  . Smoking status: Never Smoker  . Smokeless tobacco: Never Used  Substance and Sexual Activity  . Alcohol use: No  . Drug use: No  . Sexual activity: Yes    Partners: Male    Birth control/protection: Condom  Lifestyle  . Physical activity:    Days per week: Not on file    Minutes per session: Not on file  . Stress: Not on file  Relationships  . Social connections:    Talks on phone: Not on file    Gets together: Not on file    Attends religious service: Not on file    Active member of club or organization: Not on file    Attends meetings of clubs or organizations: Not on file    Relationship status: Not on file  . Intimate partner violence:    Fear of current or ex partner: Not on file    Emotionally abused: Not on file    Physically abused: Not on file    Forced sexual activity: Not on file  Other Topics Concern  . Not on file  Social History Narrative   Married--separated.   College graduate.   Homemaker.   1 daughter   1 son   No T/A/Ds.                FAMILY HISTORY: Family History  Problem Relation Age of Onset  . Bipolar disorder Mother   . Diabetes Mother   . Hypertension Father   . Peripheral vascular disease Maternal Grandmother        died during carotid surgery    ALLERGIES:  is allergic to sulfa antibiotics.  MEDICATIONS:  Current Outpatient Medications  Medication Sig Dispense Refill  . Vitamin D, Ergocalciferol,  (DRISDOL) 50000 units CAPS capsule Take 1 capsule (50,000 Units total) by mouth every 7 (seven) days. 30 capsule 3   No current facility-administered medications for this visit.     REVIEW OF SYSTEMS:   Constitutional: Denies fevers, chills or abnormal night sweats (+) restlessness  Eyes: Denies blurriness of vision, double vision or watery eyes Ears, nose, mouth, throat, and face: Denies mucositis or sore throat Respiratory: Denies cough, dyspnea or wheezes Cardiovascular: Denies chest discomfort or lower extremity swelling (+) palpitation Gastrointestinal:  Denies nausea, heartburn or change in bowel habits OB/GYNE: (+) heavy, frequent and long menses Skin: Denies abnormal skin rashes Lymphatics: Denies new lymphadenopathy or  easy bruising Neurological:Denies numbness, tingling or new weaknesses Behavioral/Psych: Mood is stable, no new changes  All other systems were reviewed with the patient and are negative.  PHYSICAL EXAMINATION:  ECOG PERFORMANCE STATUS: 0 - Asymptomatic  Vitals:   12/01/17 1052  BP: 110/75  Pulse: 64  Resp: 17  Temp: 98.4 F (36.9 C)  SpO2: 99%   Filed Weights   12/01/17 1052  Weight: 227 lb 11.2 oz (103.3 kg)    GENERAL:alert, no distress and comfortable SKIN: skin color, texture, turgor are normal, no rashes or significant lesions EYES: normal, conjunctiva are pink and non-injected, sclera clear OROPHARYNX:no exudate, no erythema and lips, buccal mucosa, and tongue normal  NECK: supple, thyroid normal size, non-tender, without nodularity LYMPH:  no palpable lymphadenopathy in the cervical, axillary or inguinal LUNGS: clear to auscultation and percussion with normal breathing effort HEART: regular rate & rhythm and no murmurs and no lower extremity edema ABDOMEN:abdomen soft, non-tender and normal bowel sounds Musculoskeletal:no cyanosis of digits and no clubbing  PSYCH: alert & oriented x 3 with fluent speech NEURO: no focal motor/sensory  deficits  LABORATORY DATA:  I have reviewed the data as listed CBC Latest Ref Rng & Units 12/01/2017 03/02/2017 11/26/2016  WBC 3.9 - 10.3 K/uL 6.3 6.9 7.1  Hemoglobin 11.6 - 15.9 g/dL 13.2 13.5 13.6  Hematocrit 34.8 - 46.6 % 39.6 39.6 40.3  Platelets 145 - 400 K/uL 217 234 222    CMP Latest Ref Rng & Units 12/18/2015 03/17/2015 10/07/2014  Glucose 73 - 118 mg/dL 90 83 113(H)  BUN 7 - 22 mg/dL 9 11 13   Creatinine 0.6 - 1.2 mg/dl 1.1 0.77 0.90  Sodium 128 - 145 mEq/L 135 141 138  Potassium 3.3 - 4.7 mEq/L 4.3 5.1 4.5  Chloride 98 - 108 mEq/L 104 107 105  CO2 18 - 33 mEq/L 28 27 28   Calcium 8.0 - 10.3 mg/dL 8.8 9.0 9.2  Total Protein 6.4 - 8.1 g/dL 6.6 6.2 6.4  Total Bilirubin 0.20 - 1.60 mg/dl 0.80 0.3 0.5  Alkaline Phos 26 - 84 U/L 72 66 57  AST 11 - 38 U/L 18 14 16   ALT 10 - 47 U/L 13 8 11    Iron/TIBC/Ferritin/ %Sat    Component Value Date/Time   IRON 94 12/01/2017 1020   IRON 57 03/02/2017 1536   TIBC 306 12/01/2017 1020   TIBC 251 03/02/2017 1536   FERRITIN 38 12/01/2017 1020   FERRITIN 118 03/02/2017 1536   IRONPCTSAT 31 12/01/2017 1020   IRONPCTSAT 23 03/02/2017 1536   IRONPCTSAT 10 (L) 11/17/2015 1514    RADIOGRAPHIC STUDIES: I have personally reviewed the radiological images as listed and agreed with the findings in the report. No results found.  ASSESSMENT & PLAN:  [name] is a 42 y.o. Caucasian female  1. Iron Deficiency secondary to menorrhagia -I have reviewed her previous chart including laboratory results in details. -Her initial lab work was consistent with iron deficiency, no anemia. Her B12 level and erythropoietin level was normal, hemoglobin electrophoresis was normal. -Her iron deficiency is secondary to menorrhagia, no clinical suspicions for other bleeding. -Also she is not anemic, she is symptomatic from iron deficiency, including hair loss, palpitation and fatigue. -She cannot tolerate oral iron pills. She previously responded well to IV our  in 2017 and 2018  -Lab results reviewed with patient, she has no anemia, ferritin has dropped to 38, iron and TIBC normal.  Given her heavy menstrual.,  And her mild symptoms  from iron deficiency, I will set up IV iron twice for the next 2 to 3 weeks. -Continue monitoring her I'll level every 6 months. - Patient is aware to call if she has any changes or experiences anything abnormal - I encouraged her to eat a more iron based diet, including more red meat -I encouraged her to discuss with her gynecologist about treatment options for her menorrhagia.  -Labs in 6 months -I'll see her back in one year with labs two weeks before  2. Vitamin D deficiency - She was on 50,000 IU every week intermittently -I will add on Vit D level to blood draw today  -I encouraged her to start over-the-counter vitamin D 2000 units daily.  If her vitamin D level is less than 20, I will call in therapy to reduce 50,000 unit weekly for 8 weeks.  PLAN - schedule IV Ferriheme twice weekly, starting next week -I advised her to see OB/GYN - labs every 6 months - f/u in 1 year with labs two weeks before  No problem-specific Assessment & Plan notes found for this encounter.   All questions were answered. The patient knows to call the clinic with any problems, questions or concerns. I spent 15 minutes counseling the patient face to face. The total time spent in the appointment was 20 minutes and more than 50% was on counseling.  Dierdre Searles Dweik am acting as scribe for Dr. Truitt Merle.  I have reviewed the above documentation for accuracy and completeness, and I agree with the above.   Truitt Merle  12/01/2017

## 2017-11-30 ENCOUNTER — Telehealth: Payer: Self-pay | Admitting: Hematology

## 2017-11-30 NOTE — Telephone Encounter (Signed)
Spoke with patient regarding lab appointment added per 8/7 staff message

## 2017-12-01 ENCOUNTER — Inpatient Hospital Stay: Payer: BC Managed Care – PPO | Attending: Hematology | Admitting: Hematology

## 2017-12-01 ENCOUNTER — Inpatient Hospital Stay: Payer: BC Managed Care – PPO

## 2017-12-01 ENCOUNTER — Encounter: Payer: Self-pay | Admitting: Hematology

## 2017-12-01 VITALS — BP 110/75 | HR 64 | Temp 98.4°F | Resp 17 | Ht 63.0 in | Wt 227.7 lb

## 2017-12-01 DIAGNOSIS — D509 Iron deficiency anemia, unspecified: Secondary | ICD-10-CM | POA: Insufficient documentation

## 2017-12-01 DIAGNOSIS — E669 Obesity, unspecified: Secondary | ICD-10-CM | POA: Diagnosis not present

## 2017-12-01 DIAGNOSIS — R002 Palpitations: Secondary | ICD-10-CM | POA: Insufficient documentation

## 2017-12-01 DIAGNOSIS — E559 Vitamin D deficiency, unspecified: Secondary | ICD-10-CM | POA: Insufficient documentation

## 2017-12-01 DIAGNOSIS — E611 Iron deficiency: Secondary | ICD-10-CM

## 2017-12-01 DIAGNOSIS — Z79899 Other long term (current) drug therapy: Secondary | ICD-10-CM | POA: Diagnosis not present

## 2017-12-01 DIAGNOSIS — N920 Excessive and frequent menstruation with regular cycle: Secondary | ICD-10-CM | POA: Diagnosis not present

## 2017-12-01 DIAGNOSIS — M26609 Unspecified temporomandibular joint disorder, unspecified side: Secondary | ICD-10-CM | POA: Diagnosis not present

## 2017-12-01 DIAGNOSIS — R451 Restlessness and agitation: Secondary | ICD-10-CM | POA: Insufficient documentation

## 2017-12-01 LAB — RETICULOCYTES
RBC.: 4.64 MIL/uL (ref 3.70–5.45)
RETIC CT PCT: 0.9 % (ref 0.7–2.1)
Retic Count, Absolute: 41.8 10*3/uL (ref 33.7–90.7)

## 2017-12-01 LAB — CBC WITH DIFFERENTIAL/PLATELET
BASOS PCT: 1 %
Basophils Absolute: 0 10*3/uL (ref 0.0–0.1)
EOS ABS: 0.1 10*3/uL (ref 0.0–0.5)
Eosinophils Relative: 2 %
HEMATOCRIT: 39.6 % (ref 34.8–46.6)
HEMOGLOBIN: 13.2 g/dL (ref 11.6–15.9)
Lymphocytes Relative: 32 %
Lymphs Abs: 2.1 10*3/uL (ref 0.9–3.3)
MCH: 28.4 pg (ref 25.1–34.0)
MCHC: 33.3 g/dL (ref 31.5–36.0)
MCV: 85.3 fL (ref 79.5–101.0)
MONO ABS: 0.4 10*3/uL (ref 0.1–0.9)
MONOS PCT: 7 %
Neutro Abs: 3.7 10*3/uL (ref 1.5–6.5)
Neutrophils Relative %: 58 %
Platelets: 217 10*3/uL (ref 145–400)
RBC: 4.64 MIL/uL (ref 3.70–5.45)
RDW: 13.5 % (ref 11.2–14.5)
WBC: 6.3 10*3/uL (ref 3.9–10.3)

## 2017-12-01 LAB — IRON AND TIBC
IRON: 94 ug/dL (ref 41–142)
SATURATION RATIOS: 31 % (ref 21–57)
TIBC: 306 ug/dL (ref 236–444)
UIBC: 212 ug/dL

## 2017-12-01 LAB — FERRITIN: Ferritin: 38 ng/mL (ref 11–307)

## 2017-12-02 ENCOUNTER — Telehealth: Payer: Self-pay | Admitting: Hematology

## 2017-12-02 ENCOUNTER — Telehealth: Payer: Self-pay

## 2017-12-02 LAB — VITAMIN D 25 HYDROXY (VIT D DEFICIENCY, FRACTURES): VIT D 25 HYDROXY: 24.4 ng/mL — AB (ref 30.0–100.0)

## 2017-12-02 NOTE — Telephone Encounter (Signed)
-----   Message from Truitt Merle, MD sent at 12/02/2017  9:01 AM EDT ----- Please let pt know her VitD result, slightly low, and I recommend OTC VitD 2000U daily, no need prescription Vit D, thanks  Truitt Merle  12/02/2017

## 2017-12-02 NOTE — Telephone Encounter (Signed)
Called pt re adding iv iron next week per 8/9 sch msg - attempted to leave vm for pt.

## 2017-12-02 NOTE — Telephone Encounter (Signed)
Per Dr. Burr Medico lab results show Vitamin D is a little low, instructed to take OTC Vitamin D 2000U daily.  Patient verbalized an understanding.

## 2017-12-02 NOTE — Telephone Encounter (Signed)
Per 8/8 los tried calling with new appointment dates and was unable to speak, or leave a message for the patient. Mailed a letter with a calender enclosed.

## 2017-12-07 ENCOUNTER — Telehealth: Payer: Self-pay | Admitting: Hematology

## 2017-12-07 ENCOUNTER — Encounter: Payer: Self-pay | Admitting: Family Medicine

## 2017-12-07 NOTE — Telephone Encounter (Signed)
Scheduled appt per 8/9 sch message - pt aware of appt date and time.

## 2017-12-09 ENCOUNTER — Inpatient Hospital Stay: Payer: BC Managed Care – PPO

## 2017-12-09 VITALS — BP 103/77 | HR 54 | Temp 98.2°F | Resp 14

## 2017-12-09 DIAGNOSIS — N920 Excessive and frequent menstruation with regular cycle: Secondary | ICD-10-CM

## 2017-12-09 DIAGNOSIS — D509 Iron deficiency anemia, unspecified: Secondary | ICD-10-CM | POA: Diagnosis not present

## 2017-12-09 DIAGNOSIS — E611 Iron deficiency: Secondary | ICD-10-CM

## 2017-12-09 MED ORDER — SODIUM CHLORIDE 0.9 % IV SOLN
750.0000 mL | Freq: Once | INTRAVENOUS | Status: AC
  Administered 2017-12-09: 250 mL via INTRAVENOUS
  Filled 2017-12-09: qty 750

## 2017-12-09 MED ORDER — FERUMOXYTOL INJECTION 510 MG/17 ML
510.0000 mg | Freq: Once | INTRAVENOUS | Status: AC
  Administered 2017-12-09: 510 mg via INTRAVENOUS
  Filled 2017-12-09: qty 17

## 2017-12-09 NOTE — Patient Instructions (Signed)

## 2017-12-16 ENCOUNTER — Inpatient Hospital Stay: Payer: BC Managed Care – PPO

## 2017-12-16 VITALS — BP 110/78 | HR 56 | Temp 98.3°F | Resp 18

## 2017-12-16 DIAGNOSIS — E611 Iron deficiency: Secondary | ICD-10-CM

## 2017-12-16 DIAGNOSIS — D509 Iron deficiency anemia, unspecified: Secondary | ICD-10-CM | POA: Diagnosis not present

## 2017-12-16 DIAGNOSIS — N920 Excessive and frequent menstruation with regular cycle: Secondary | ICD-10-CM

## 2017-12-16 MED ORDER — SODIUM CHLORIDE 0.9 % IV SOLN
INTRAVENOUS | Status: DC
  Administered 2017-12-16: 08:00:00 via INTRAVENOUS
  Filled 2017-12-16: qty 250

## 2017-12-16 MED ORDER — SODIUM CHLORIDE 0.9 % IV SOLN
510.0000 mg | Freq: Once | INTRAVENOUS | Status: AC
  Administered 2017-12-16: 510 mg via INTRAVENOUS
  Filled 2017-12-16: qty 17

## 2017-12-16 NOTE — Patient Instructions (Signed)

## 2018-02-07 ENCOUNTER — Encounter: Payer: Self-pay | Admitting: Family Medicine

## 2018-02-07 ENCOUNTER — Other Ambulatory Visit: Payer: Self-pay

## 2018-02-07 ENCOUNTER — Ambulatory Visit: Payer: BC Managed Care – PPO | Admitting: Family Medicine

## 2018-02-07 VITALS — BP 112/82 | HR 103 | Temp 100.5°F

## 2018-02-07 DIAGNOSIS — B349 Viral infection, unspecified: Secondary | ICD-10-CM | POA: Diagnosis not present

## 2018-02-07 LAB — POC INFLUENZA A&B (BINAX/QUICKVUE)
Influenza A, POC: NEGATIVE
Influenza B, POC: NEGATIVE

## 2018-02-07 NOTE — Progress Notes (Signed)
Subjective   CC:  Chief Complaint  Patient presents with  . Cough    fever of 103 at home, chills and body aches since Sunday     HPI: [name] is a 42 y.o. female who presents to the office today to address the problems listed above in the chief complaint. Same day acute visit; PCP not available. New pt to me. Chart reviewed.    Patient complains of flu like symptoms including myalgias, fever, ST, mild cough and some congestion. Sxs have been present for 2-3 days. Tmax 103 today. She has tried to alleviate the sxs with over-the-counter medicines with mild relief. No high risk factors for influenza complications or high risk household contacts are present. No SOB or CP are present. Taking in fluids adequately; decreased appetite bt no significant n/v/d. She has not had the flu vaccine this season.   Assessment  1. Viral syndrome      Plan   Viral syndrome/FLI: supportive care instructions given. Out of work. F/u with me if fevers persists > 48 more hours for further eval. Discussed alarm sxs.  Follow up: Return if symptoms worsen or fail to improve.   Orders Placed This Encounter  Procedures  . POC Influenza A&B(BINAX/QUICKVUE)   No orders of the defined types were placed in this encounter.    I reviewed the patients updated PMH, FH, and SocHx.    Patient Active Problem List   Diagnosis Date Noted  . Need for prophylactic vaccination and inoculation against influenza 01/28/2016  . Menorrhagia with regular cycle 12/31/2015  . Iron deficiency 12/18/2015  . CAP (community acquired pneumonia) 07/20/2013  . History of loop electrical excision procedure (LEEP) 10/16/2012  . Plantar fasciitis of left foot 07/12/2012   No outpatient medications have been marked as taking for the 02/07/18 encounter (Office Visit) with Leamon Arnt, MD.   Family History: Patient family history includes Bipolar disorder in her mother; Diabetes in her mother; Hypertension in her  father; Peripheral vascular disease in her maternal grandmother. Social History:  Patient  reports that she has never smoked. She has never used smokeless tobacco. She reports that she does not drink alcohol or use drugs.  Review of Systems: Constitutional: negative for fever or malaise Ophthalmic: negative for photophobia, double vision or loss of vision Cardiovascular: negative for chest pain, dyspnea on exertion, or new LE swelling Respiratory: negative for SOB or persistent cough Gastrointestinal: negative for abdominal pain, change in bowel habits or melena Genitourinary: negative for dysuria or gross hematuria Musculoskeletal: negative for new gait disturbance or muscular weakness Integumentary: negative for new or persistent rashes Neurological: negative for TIA or stroke symptoms Psychiatric: negative for SI or delusions Allergic/Immunologic: negative for hives  Objective  Vitals: BP 112/82   Pulse (!) 103   Temp (!) 100.5 F (38.1 C)   SpO2 99%  General: no acute respiratory distress , appears nontoxic Psych:  Alert and oriented, normal mood and affect HEENT: Normocephalic, nasal congestion present, TMs w/o erythema, OP with erythema w/o exudate, no LAD, supple neck  Cardiovascular:  RRR without murmur or gallop. no peripheral edema Respiratory:  Good breath sounds bilaterally, CTAB with normal respiratory effort Gastrointestinal: soft, flat abdomen, normal active bowel sounds, no palpable masses, no hepatosplenomegaly, no appreciated hernias Skin:  Warm, no rashes Neurologic:   Mental status is normal. normal gait Office Visit on 02/07/2018  Component Date Value Ref Range Status  . Influenza A, POC 02/07/2018 Negative  Negative Final  .  Influenza B, POC 02/07/2018 Negative  Negative Final     Commons side effects, risks, benefits, and alternatives for medications and treatment plan prescribed today were discussed, and the patient expressed understanding of the given  instructions. Patient is instructed to call or message via MyChart if he/she has any questions or concerns regarding our treatment plan. No barriers to understanding were identified. We discussed Red Flag symptoms and signs in detail. Patient expressed understanding regarding what to do in case of urgent or emergency type symptoms.   Medication list was reconciled, printed and provided to the patient in AVS. Patient instructions and summary information was reviewed with the patient as documented in the AVS. This note was prepared with assistance of Dragon voice recognition software. Occasional wrong-word or sound-a-like substitutions may have occurred due to the inherent limitations of voice recognition software

## 2018-02-07 NOTE — Patient Instructions (Signed)
Please follow up if symptoms do not improve or as needed.   Hydrate and use advil 3tabs 3x/day. Viral Illness, Adult Viruses are tiny germs that can get into a person's body and cause illness. There are many different types of viruses, and they cause many types of illness. Viral illnesses can range from mild to severe. They can affect various parts of the body. Common illnesses that are caused by a virus include colds and the flu. Viral illnesses also include serious conditions such as HIV/AIDS (human immunodeficiency virus/acquired immunodeficiency syndrome). A few viruses have been linked to certain cancers. What are the causes? Many types of viruses can cause illness. Viruses invade cells in your body, multiply, and cause the infected cells to malfunction or die. When the cell dies, it releases more of the virus. When this happens, you develop symptoms of the illness, and the virus continues to spread to other cells. If the virus takes over the function of the cell, it can cause the cell to divide and grow out of control, as is the case when a virus causes cancer. Different viruses get into the body in different ways. You can get a virus by:  Swallowing food or water that is contaminated with the virus.  Breathing in droplets that have been coughed or sneezed into the air by an infected person.  Touching a surface that has been contaminated with the virus and then touching your eyes, nose, or mouth.  Being bitten by an insect or animal that carries the virus.  Having sexual contact with a person who is infected with the virus.  Being exposed to blood or fluids that contain the virus, either through an open cut or during a transfusion.  If a virus enters your body, your body's defense system (immune system) will try to fight the virus. You may be at higher risk for a viral illness if your immune system is weak. What are the signs or symptoms? Symptoms vary depending on the type of virus and  the location of the cells that it invades. Common symptoms of the main types of viral illnesses include: Cold and flu viruses  Fever.  Headache.  Sore throat.  Muscle aches.  Nasal congestion.  Cough. Digestive system (gastrointestinal) viruses  Fever.  Abdominal pain.  Nausea.  Diarrhea. Liver viruses (hepatitis)  Loss of appetite.  Tiredness.  Yellowing of the skin (jaundice). Brain and spinal cord viruses  Fever.  Headache.  Stiff neck.  Nausea and vomiting.  Confusion or sleepiness. Skin viruses  Warts.  Itching.  Rash. Sexually transmitted viruses  Discharge.  Swelling.  Redness.  Rash. How is this treated? Viruses can be difficult to treat because they live within cells. Antibiotic medicines do not treat viruses because these drugs do not get inside cells. Treatment for a viral illness may include:  Resting and drinking plenty of fluids.  Medicines to relieve symptoms. These can include over-the-counter medicine for pain and fever, medicines for cough or congestion, and medicines to relieve diarrhea.  Antiviral medicines. These drugs are available only for certain types of viruses. They may help reduce flu symptoms if taken early. There are also many antiviral medicines for hepatitis and HIV/AIDS.  Some viral illnesses can be prevented with vaccinations. A common example is the flu shot. Follow these instructions at home: Medicines   Take over-the-counter and prescription medicines only as told by your health care provider.  If you were prescribed an antiviral medicine, take it as told by your  health care provider. Do not stop taking the medicine even if you start to feel better.  Be aware of when antibiotics are needed and when they are not needed. Antibiotics do not treat viruses. If your health care provider thinks that you may have a bacterial infection as well as a viral infection, you may get an antibiotic. ? Do not ask for an  antibiotic prescription if you have been diagnosed with a viral illness. That will not make your illness go away faster. ? Frequently taking antibiotics when they are not needed can lead to antibiotic resistance. When this develops, the medicine no longer works against the bacteria that it normally fights. General instructions  Drink enough fluids to keep your urine clear or pale yellow.  Rest as much as possible.  Return to your normal activities as told by your health care provider. Ask your health care provider what activities are safe for you.  Keep all follow-up visits as told by your health care provider. This is important. How is this prevented? Take these actions to reduce your risk of viral infection:  Eat a healthy diet and get enough rest.  Wash your hands often with soap and water. This is especially important when you are in public places. If soap and water are not available, use hand sanitizer.  Avoid close contact with friends and family who have a viral illness.  If you travel to areas where viral gastrointestinal infection is common, avoid drinking water or eating raw food.  Keep your immunizations up to date. Get a flu shot every year as told by your health care provider.  Do not share toothbrushes, nail clippers, razors, or needles with other people.  Always practice safe sex.  Contact a health care provider if:  You have symptoms of a viral illness that do not go away.  Your symptoms come back after going away.  Your symptoms get worse. Get help right away if:  You have trouble breathing.  You have a severe headache or a stiff neck.  You have severe vomiting or abdominal pain. This information is not intended to replace advice given to you by your health care provider. Make sure you discuss any questions you have with your health care provider. Document Released: 08/22/2015 Document Revised: 09/24/2015 Document Reviewed: 08/22/2015 Elsevier Interactive  Patient Education  [employer].

## 2018-02-09 ENCOUNTER — Telehealth: Payer: Self-pay | Admitting: Emergency Medicine

## 2018-02-09 NOTE — Telephone Encounter (Signed)
Copied from [place] [id number]. Topic: General - Inquiry >> Feb 09, 2018  2:29 PM Conception Chancy, NT wrote: Reason for CRM: patient is calling and was seen by Dr. Jonni Sanger on 02/07/18 and tested negative for the flu. Patient states she has a really bad cough and still a fever of 101.7. She was told to contact Dr. Jonni Sanger back if she was not better. Patient is a patient of Dr. Anitra Lauth.  Please advise.   Doloris Hall,  LPN

## 2018-02-10 ENCOUNTER — Encounter: Payer: Self-pay | Admitting: *Deleted

## 2018-02-10 ENCOUNTER — Ambulatory Visit: Payer: BC Managed Care – PPO | Admitting: Family Medicine

## 2018-02-10 ENCOUNTER — Encounter: Payer: Self-pay | Admitting: Family Medicine

## 2018-02-10 VITALS — BP 106/74 | HR 82 | Temp 98.4°F | Resp 16 | Ht 63.0 in | Wt 222.0 lb

## 2018-02-10 DIAGNOSIS — J180 Bronchopneumonia, unspecified organism: Secondary | ICD-10-CM

## 2018-02-10 MED ORDER — ALBUTEROL SULFATE HFA 108 (90 BASE) MCG/ACT IN AERS: 2.0000 | INHALATION_SPRAY | RESPIRATORY_TRACT | 0 refills | Status: DC | PRN

## 2018-02-10 MED ORDER — PREDNISONE 20 MG PO TABS: ORAL_TABLET | ORAL | 0 refills | Status: DC

## 2018-02-10 MED ORDER — AZITHROMYCIN 250 MG PO TABS: ORAL_TABLET | ORAL | 0 refills | Status: DC

## 2018-02-10 NOTE — Patient Instructions (Signed)
Get otc Mucinex DM and use as directed on the packaging for cough and congestion.

## 2018-02-10 NOTE — Progress Notes (Signed)
OFFICE VISIT  02/10/2018   CC:  Chief Complaint  Patient presents with  . Cough    fever, headache   HPI:    Patient is a 42 y.o. Caucasian female who presents for f/u of ILI for which she saw Dr. Jonni Sanger on 02/07/18. Flu A/B testing was negative.  Symptomatic care was the plan. Interim hx: She has been sick now for 6 days--febrile each day. Still with bad cough, bad HA, fever to 101.    Very tired.  No ST.  She has significant hoarse voice now.  No nasal/sinus congestion. No rash.  Achiness in body is gone.  Poor appetite but drinking fluids fair.  No n/v/d.   Past Medical History:  Diagnosis Date  . Allergic rhinitis   . History of ectopic pregnancy 2009   left, tube preserved  . Iron deficiency 10/2015   w/out anemia (from pt's menorrhagia). Symptomatic : fatigue and hair loss.  Intol of oral iron.  Hematology saw her 12/25/15 and did feraheme x 2 doses.  Pt's symptoms abated nicely with this.  Recheck of iron studies 04/2016 were good--no infusion needed.  Ongoing f/u with hematology: 11/2016 and 11/2017--Dr. Burr Medico started regularly scheduled feraheme treatments.  . Menorrhagia    needs to discuss tx options other than oral contraceptives with her GYN.  . Obesity, Class II, BMI 35-39.9 09/29/2007   Qualifier: Diagnosis of  By: Wynona Luna   . TMJ pain dysfunction syndrome 2015   (causing otalgia/referred pain)-ENT is Dr. Redmond Baseman  . Vitamin D deficiency     Past Surgical History:  Procedure Laterality Date  . CESAREAN SECTION      2007 & 2010  . CHOLECYSTECTOMY     2004 (for dysfunctional GB not for stones)  . COLONOSCOPY  10/18/2002   Severe diverticulosis from transverse to sigmoid colon, with increased spasm and tortuosity: likely cause of her urgency.  Recall 5 yrs (Dr. Lyla Son).  Marland Kitchen LAPAROSCOPY FOR ECTOPIC PREGNANCY  07/26/2007   salpingostomy for ectopic  . 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 TYMPANOSTOMY TUBE PLACEMENT  as a child    Outpatient Medications  Prior to Visit  Medication Sig Dispense Refill  . acetaminophen (TYLENOL) 325 MG tablet Take 650 mg by mouth every 6 (six) hours as needed.    Marland Kitchen ibuprofen (ADVIL,MOTRIN) 200 MG tablet Take 200 mg by mouth every 6 (six) hours as needed.     No facility-administered medications prior to visit.     Allergies  Allergen Reactions  . Sulfa Antibiotics Hives    ROS As per HPI  PE: Blood pressure 106/74, pulse 82, temperature 98.4 F (36.9 C), temperature source Oral, resp. rate 16, height 5\' 3"  (1.6 m), weight 222 lb (100.7 kg), SpO2 97 %. VS: noted--normal. Gen: alert, NAD, NONTOXIC APPEARING. HEENT: eyes without injection, drainage, or swelling.  Ears: EACs clear, TMs with normal light reflex and landmarks.  Nose: clear. No purulent d/c.  No paranasal sinus TTP.  No facial swelling.  Throat and mouth without focal lesion.  No pharyngial swelling, erythema, or exudate.   Neck: supple, no LAD.   LUNGS: CTA bilat on inspiration, but aeration is not optimal.  On forced exhalation I can hear diffuse coarse end exp wheezing and she coughs easily.  Nonlabored resps.   CV: RRR, no m/r/g. EXT: no c/c/e SKIN: no rash Kernig's and brudzinski's testing neg.   LABS:  Lab Results  Component Value Date  WBC 6.3 12/01/2017   HGB 13.2 12/01/2017   HCT 39.6 12/01/2017   MCV 85.3 12/01/2017   PLT 217 12/01/2017     Chemistry      Component Value Date/Time   NA 135 12/18/2015 0934   K 4.3 12/18/2015 0934   CL 104 12/18/2015 0934   CO2 28 12/18/2015 0934   BUN 9 12/18/2015 0934   CREATININE 1.1 12/18/2015 0934      Component Value Date/Time   CALCIUM 8.8 12/18/2015 0934   ALKPHOS 72 12/18/2015 0934   AST 18 12/18/2015 0934   ALT 13 12/18/2015 0934   BILITOT 0.80 12/18/2015 0934      IMPRESSION AND PLAN:  Acute bronchopneumonia w/daily fever--continues to worsen. Plan: Prednisone 40mg  qd x 5d, Z-pack, albuterol HFA. Inhaler education today by nurse. No work until she has been  afebrile for 24h.  If fever not resolved by Monday 02/13/18, then she needs to return and I'll recheck her and get blood work and CXR.  An After Visit Summary was printed and given to the patient.  FOLLOW UP: Return if symptoms worsen or fail to improve (if fever has not resolved by Monday 02/13/18).  Signed:  Crissie Sickles, MD           02/10/2018

## 2018-02-12 NOTE — Telephone Encounter (Signed)
Pt was seen and treated 10/18 by pcp

## 2018-02-13 ENCOUNTER — Ambulatory Visit (HOSPITAL_BASED_OUTPATIENT_CLINIC_OR_DEPARTMENT_OTHER)
Admission: RE | Admit: 2018-02-13 | Discharge: 2018-02-13 | Disposition: A | Discharge status: Home / Self Care | Payer: BC Managed Care – PPO | Source: Ambulatory Visit | Attending: Family Medicine | Admitting: Family Medicine

## 2018-02-13 ENCOUNTER — Encounter: Payer: Self-pay | Admitting: *Deleted

## 2018-02-13 ENCOUNTER — Encounter: Payer: Self-pay | Admitting: Family Medicine

## 2018-02-13 ENCOUNTER — Ambulatory Visit: Payer: BC Managed Care – PPO | Admitting: Family Medicine

## 2018-02-13 VITALS — BP 115/78 | HR 68 | Temp 98.3°F | Resp 16 | Ht 63.0 in | Wt 222.5 lb

## 2018-02-13 DIAGNOSIS — R509 Fever, unspecified: Secondary | ICD-10-CM | POA: Diagnosis present

## 2018-02-13 DIAGNOSIS — R05 Cough: Secondary | ICD-10-CM | POA: Diagnosis present

## 2018-02-13 DIAGNOSIS — E860 Dehydration: Secondary | ICD-10-CM | POA: Diagnosis not present

## 2018-02-13 DIAGNOSIS — J9811 Atelectasis: Secondary | ICD-10-CM | POA: Diagnosis not present

## 2018-02-13 DIAGNOSIS — J180 Bronchopneumonia, unspecified organism: Secondary | ICD-10-CM

## 2018-02-13 DIAGNOSIS — R5081 Fever presenting with conditions classified elsewhere: Secondary | ICD-10-CM | POA: Diagnosis not present

## 2018-02-13 LAB — COMPREHENSIVE METABOLIC PANEL
ALK PHOS: 54 U/L (ref 39–117)
ALT: 9 U/L (ref 0–35)
AST: 10 U/L (ref 0–37)
Albumin: 3.9 g/dL (ref 3.5–5.2)
BUN: 16 mg/dL (ref 6–23)
CHLORIDE: 104 meq/L (ref 96–112)
CO2: 29 meq/L (ref 19–32)
Calcium: 9 mg/dL (ref 8.4–10.5)
Creatinine, Ser: 0.69 mg/dL (ref 0.40–1.20)
GFR: 100.14 mL/min (ref >60.00)
GLUCOSE: 79 mg/dL (ref 70–99)
Potassium: 4.2 mEq/L (ref 3.5–5.1)
SODIUM: 139 meq/L (ref 135–145)
TOTAL PROTEIN: 6.4 g/dL (ref 6.0–8.3)
Total Bilirubin: 0.4 mg/dL (ref 0.2–1.2)

## 2018-02-13 LAB — CBC WITH DIFFERENTIAL/PLATELET
Basophils Absolute: 0 10*3/uL (ref 0.0–0.1)
Basophils Relative: 0.3 % (ref 0.0–3.0)
Eosinophils Absolute: 0.1 10*3/uL (ref 0.0–0.7)
Eosinophils Relative: 0.5 % (ref 0.0–5.0)
HCT: 41.6 % (ref 36.0–46.0)
Hemoglobin: 14.3 g/dL (ref 12.0–15.0)
LYMPHS ABS: 3.5 10*3/uL (ref 0.7–4.0)
Lymphocytes Relative: 35.6 % (ref 12.0–46.0)
MCHC: 34.4 g/dL (ref 30.0–36.0)
MCV: 87 fl (ref 78.0–100.0)
MONO ABS: 0.8 10*3/uL (ref 0.1–1.0)
Monocytes Relative: 7.7 % (ref 3.0–12.0)
NEUTROS ABS: 5.6 10*3/uL (ref 1.4–7.7)
NEUTROS PCT: 55.9 % (ref 43.0–77.0)
PLATELETS: 343 10*3/uL (ref 150.0–400.0)
RBC: 4.78 Mil/uL (ref 3.87–5.11)
RDW: 14.4 % (ref 11.5–15.5)
WBC: 9.9 10*3/uL (ref 4.0–10.5)

## 2018-02-13 MED ORDER — BENZONATATE 200 MG PO CAPS: 200.0000 mg | ORAL_CAPSULE | Freq: Three times a day (TID) | ORAL | 0 refills | Status: DC | PRN

## 2018-02-13 NOTE — Progress Notes (Signed)
OFFICE VISIT  02/13/2018   CC:  Chief Complaint  Patient presents with  . Follow-up    pneumonia     HPI:    Patient is a 42 y.o. Caucasian female who presents accompanied by her father for f/u respiratory illness--bronchopneumonia. I saw her 3 d/a and started her on Z pack, prednisone, and albuterol HFA.  She had been sick for 6d at that time and had been having daily fever.  No more fever in the last 3d since I saw her. Still bad cough and bad HA and very tired. Cough worse when trying to take a deep breath and when she laughs. Compliant with all rx meds.  Pred and albut make her jittery but she does get improvement. No n/v/d.  Fluid intake is poor.   Taking ibup and alt with tylenol makes her HA go away but it always comes back. Fluid intake sounds fair but needs to drink more clear liquids.  Past Medical History:  Diagnosis Date  . Allergic rhinitis   . History of ectopic pregnancy 2009   left, tube preserved  . Iron deficiency 10/2015   w/out anemia (from pt's menorrhagia). Symptomatic : fatigue and hair loss.  Intol of oral iron.  Hematology saw her 12/25/15 and did feraheme x 2 doses.  Pt's symptoms abated nicely with this.  Recheck of iron studies 04/2016 were good--no infusion needed.  Ongoing f/u with hematology: 11/2016 and 11/2017--Dr. Burr Medico started regularly scheduled feraheme treatments.  . Menorrhagia    needs to discuss tx options other than oral contraceptives with her GYN.  . Obesity, Class II, BMI 35-39.9 09/29/2007   Qualifier: Diagnosis of  By: Wynona Luna   . TMJ pain dysfunction syndrome 2015   (causing otalgia/referred pain)-ENT is Dr. Redmond Baseman  . Vitamin D deficiency     Past Surgical History:  Procedure Laterality Date  . CESAREAN SECTION      2007 & 2010  . CHOLECYSTECTOMY     2004 (for dysfunctional GB not for stones)  . COLONOSCOPY  10/18/2002   Severe diverticulosis from transverse to sigmoid colon, with increased spasm and tortuosity: likely  cause of her urgency.  Recall 5 yrs (Dr. Lyla Son).  Marland Kitchen LAPAROSCOPY FOR ECTOPIC PREGNANCY  07/26/2007   salpingostomy for ectopic  . 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 TYMPANOSTOMY TUBE PLACEMENT  as a child    Outpatient Medications Prior to Visit  Medication Sig Dispense Refill  . acetaminophen (TYLENOL) 325 MG tablet Take 650 mg by mouth every 6 (six) hours as needed.    Marland Kitchen albuterol (PROAIR HFA) 108 (90 Base) MCG/ACT inhaler Inhale 2 puffs into the lungs every 4 (four) hours as needed for wheezing or shortness of breath. 1 Inhaler 0  . azithromycin (ZITHROMAX) 250 MG tablet 2 tabs po qd x 1d, then 1 tab po qd x 4d 6 tablet 0  . ibuprofen (ADVIL,MOTRIN) 200 MG tablet Take 200 mg by mouth every 6 (six) hours as needed.    . predniSONE (DELTASONE) 20 MG tablet 2 tabs po qd x 5d 10 tablet 0   No facility-administered medications prior to visit.     Allergies  Allergen Reactions  . Sulfa Antibiotics Hives    ROS As per HPI  PE: Blood pressure 115/78, pulse 68, temperature 98.3 F (36.8 C), temperature source Oral, resp. rate 16, height 5\' 3"  (1.6 m), weight 222 lb 8 oz (100.9 kg), SpO2 94 %. Gen: Alert, well  appearing.  Patient is oriented to person, place, time, and situation. AFFECT: pleasant, lucid thought and speech. JKK:XFGH: no injection, icteris, swelling, or exudate.  EOMI, PERRLA. Mouth: lips without lesion/swelling.  Oral mucosa pink and moist. Oropharynx without erythema, exudate, or swelling.  CV: RRR, no m/r/g LUNGS: CTA bilat, but she coughs VERY easily and excessively when she starts to exhale. EXT: no clubbing or cyanosis.  no edema.    LABS:    Chemistry      Component Value Date/Time   NA 135 12/18/2015 0934   K 4.3 12/18/2015 0934   CL 104 12/18/2015 0934   CO2 28 12/18/2015 0934   BUN 9 12/18/2015 0934   CREATININE 1.1 12/18/2015 0934      Component Value Date/Time   CALCIUM 8.8 12/18/2015 0934   ALKPHOS 72 12/18/2015 0934   AST 18 12/18/2015  0934   ALT 13 12/18/2015 0934   BILITOT 0.80 12/18/2015 0934     Lab Results  Component Value Date   WBC 6.3 12/01/2017   HGB 13.2 12/01/2017   HCT 39.6 12/01/2017   MCV 85.3 12/01/2017   PLT 217 12/01/2017    IMPRESSION AND PLAN:  Acute bronchopneumonia, prolonged fever. Fever has not resolved fortunately, but she still feels awful. I won't change any current meds.  I'll add tessalon pearls 200 mg tid prn for cough. I encouraged more aggressive clear fluid intake. Check CXR today as well as CBC w/diff and CMET.  An After Visit Summary was printed and given to the patient.  FOLLOW UP: Return if symptoms worsen or fail to improve.  Signed:  Crissie Sickles, MD           02/13/2018

## 2018-02-23 ENCOUNTER — Ambulatory Visit: Payer: BC Managed Care – PPO | Admitting: Obstetrics and Gynecology

## 2018-02-23 ENCOUNTER — Other Ambulatory Visit: Payer: Self-pay

## 2018-02-23 ENCOUNTER — Other Ambulatory Visit (HOSPITAL_COMMUNITY)
Admission: RE | Admit: 2018-02-23 | Discharge: 2018-02-23 | Disposition: A | Discharge status: Home / Self Care | Payer: BC Managed Care – PPO | Source: Ambulatory Visit | Attending: Obstetrics and Gynecology | Admitting: Obstetrics and Gynecology

## 2018-02-23 ENCOUNTER — Encounter: Payer: Self-pay | Admitting: Obstetrics and Gynecology

## 2018-02-23 VITALS — BP 110/66 | HR 96 | Resp 18 | Ht 63.0 in | Wt 223.2 lb

## 2018-02-23 DIAGNOSIS — Z01419 Encounter for gynecological examination (general) (routine) without abnormal findings: Secondary | ICD-10-CM | POA: Diagnosis present

## 2018-02-23 DIAGNOSIS — N920 Excessive and frequent menstruation with regular cycle: Secondary | ICD-10-CM | POA: Diagnosis not present

## 2018-02-23 NOTE — Patient Instructions (Addendum)
EXERCISE AND DIET:  We recommended that you start or continue a regular exercise program for good health. Regular exercise means any activity that makes your heart beat faster and makes you sweat.  We recommend exercising at least 30 minutes per day at least 3 days a week, preferably 4 or 5.  We also recommend a diet low in fat and sugar.  Inactivity, poor dietary choices and obesity can cause diabetes, heart attack, stroke, and kidney damage, among others.    ALCOHOL AND SMOKING:  Women should limit their alcohol intake to no more than 7 drinks/beers/glasses of wine (combined, not each!) per week. Moderation of alcohol intake to this level decreases your risk of breast cancer and liver damage. And of course, no recreational drugs are part of a healthy lifestyle.  And absolutely no smoking or even second hand smoke. Most people know smoking can cause heart and lung diseases, but did you know it also contributes to weakening of your bones? Aging of your skin?  Yellowing of your teeth and nails?  CALCIUM AND VITAMIN D:  Adequate intake of calcium and Vitamin D are recommended.  The recommendations for exact amounts of these supplements seem to change often, but generally speaking 600 mg of calcium (either carbonate or citrate) and 800 units of Vitamin D per day seems prudent. Certain women may benefit from higher intake of Vitamin D.  If you are among these women, your doctor will have told you during your visit.    PAP SMEARS:  Pap smears, to check for cervical cancer or precancers,  have traditionally been done yearly, although recent scientific advances have shown that most women can have pap smears less often.  However, every woman still should have a physical exam from her gynecologist every year. It will include a breast check, inspection of the vulva and vagina to check for abnormal growths or skin changes, a visual exam of the cervix, and then an exam to evaluate the size and shape of the uterus and  ovaries.  And after 40 years of age, a rectal exam is indicated to check for rectal cancers. We will also provide age appropriate advice regarding health maintenance, like when you should have certain vaccines, screening for sexually transmitted diseases, bone density testing, colonoscopy, mammograms, etc.   MAMMOGRAMS:  All women over 40 years old should have a yearly mammogram. Many facilities now offer a "3D" mammogram, which may cost around $50 extra out of pocket. If possible,  we recommend you accept the option to have the 3D mammogram performed.  It both reduces the number of women who will be called back for extra views which then turn out to be normal, and it is better than the routine mammogram at detecting truly abnormal areas.    COLONOSCOPY:  Colonoscopy to screen for colon cancer is recommended for all women at age 50.  We know, you hate the idea of the prep.  We agree, BUT, having colon cancer and not knowing it is worse!!  Colon cancer so often starts as a polyp that can be seen and removed at colonscopy, which can quite literally save your life!  And if your first colonoscopy is normal and you have no family history of colon cancer, most women don't have to have it again for 10 years.  Once every ten years, you can do something that may end up saving your life, right?  We will be happy to help you get it scheduled when you are ready.    Be sure to check your insurance coverage so you understand how much it will cost.  It may be covered as a preventative service at no cost, but you should check your particular policy.     HPV (Human Papillomavirus) Vaccine: What You Need to Know 1. Why get vaccinated? HPV vaccine prevents infection with human papillomavirus (HPV) types that are associated with many cancers, including:  cervical cancer in females,  vaginal and vulvar cancers in females,  anal cancer in females and males,  throat cancer in females and males, and  penile cancer in  males.  In addition, HPV vaccine prevents infection with HPV types that cause genital warts in both females and males. In the [place] about 12,000 women get cervical cancer every year, and about 4,000 women die from it. HPV vaccine can prevent most of these cases of cervical cancer. Vaccination is not a substitute for cervical cancer screening. This vaccine does not protect against all HPV types that can cause cervical cancer. Women should still get regular Pap tests. HPV infection usually comes from sexual contact, and most people will become infected at some point in their life. About 14 million Americans, including teens, get infected every year. Most infections will go away on their own and not cause serious problems. But thousands of women and men get cancer and other diseases from HPV. 2. HPV vaccine HPV vaccine is approved by FDA and is recommended by CDC for both males and females. It is routinely given at 8 or 40 years of age, but it may be given beginning at age 44 years through age 2 years. Most adolescents 9 through 40 years of age should get HPV vaccine as a two-dose series with the doses separated by 6-12 months. People who start HPV vaccination at 67 years of age and older should get the vaccine as a three-dose series with the second dose given 1-2 months after the first dose and the third dose given 6 months after the first dose. There are several exceptions to these age recommendations. Your health care provider can give you more information. 3. Some people should not get this vaccine  Anyone who has had a severe (life-threatening) allergic reaction to a dose of HPV vaccine should not get another dose.  Anyone who has a severe (life threatening) allergy to any component of HPV vaccine should not get the vaccine.  Tell your doctor if you have any severe allergies that you know of, including a severe allergy to yeast.  HPV vaccine is not recommended for pregnant women. If you learn  that you were pregnant when you were vaccinated, there is no reason to expect any problems for you or your baby. Any woman who learns she was pregnant when she got HPV vaccine is encouraged to contact the manufacturer's registry for HPV vaccination during pregnancy at 6846651352. Women who are breastfeeding may be vaccinated.  If you have a mild illness, such as a cold, you can probably get the vaccine today. If you are moderately or severely ill, you should probably wait until you recover. Your doctor can advise you. 4. Risks of a vaccine reaction With any medicine, including vaccines, there is a chance of side effects. These are usually mild and go away on their own, but serious reactions are also possible. Most people who get HPV vaccine do not have any serious problems with it. Mild or moderate problems following HPV vaccine:  Reactions in the arm where the shot was given: ? Soreness (about  9 people in 10) ? Redness or swelling (about 1 person in 3)  Fever: ? Mild (100F) (about 1 person in 10) ? Moderate (102F) (about 1 person in 15)  Other problems: ? Headache (about 1 person in 3) Problems that could happen after any injected vaccine:  People sometimes faint after a medical procedure, including vaccination. Sitting or lying down for about 15 minutes can help prevent fainting, and injuries caused by a fall. Tell your doctor if you feel dizzy, or have vision changes or ringing in the ears.  Some people get severe pain in the shoulder and have difficulty moving the arm where a shot was given. This happens very rarely.  Any medication can cause a severe allergic reaction. Such reactions from a vaccine are very rare, estimated at about 1 in a million doses, and would happen within a few minutes to a few hours after the vaccination. As with any medicine, there is a very remote chance of a vaccine causing a serious injury or death. The safety of vaccines is always being monitored. For  more information, visit: http://www.aguilar.org/. 5. What if there is a serious reaction? What should I look for? Look for anything that concerns you, such as signs of a severe allergic reaction, very high fever, or unusual behavior. Signs of a severe allergic reaction can include hives, swelling of the face and throat, difficulty breathing, a fast heartbeat, dizziness, and weakness. These would usually start a few minutes to a few hours after the vaccination. What should I do? If you think it is a severe allergic reaction or other emergency that can't wait, call 9-1-1 or get to the nearest hospital. Otherwise, call your doctor. Afterward, the reaction should be reported to the Vaccine Adverse Event Reporting System (VAERS). Your doctor should file this report, or you can do it yourself through the VAERS web site at www.vaers.SamedayNews.es, or by calling 959-427-7861. VAERS does not give medical advice. 6. The National Vaccine Injury Compensation Program The [employer] Vaccine Injury Compensation Program (VICP) is a federal program that was created to compensate people who may have been injured by certain vaccines. Persons who believe they may have been injured by a vaccine can learn about the program and about filing a claim by calling 901-084-1735 or visiting the [place] website at GoldCloset.com.ee. There is a time limit to file a claim for compensation. 7. How can I learn more?  Ask your health care provider. He or she can give you the vaccine package insert or suggest other sources of information.  Call your local or state health department.  Contact the Centers for Disease Control and Prevention (CDC): ? Call (787)368-6112 (1-800-CDC-INFO) or ? Visit CDC's website at http://sweeney-todd.com/ Vaccine Information Statement, HPV Vaccine (03/28/2015) This information is not intended to replace advice given to you by your health care provider. Make sure you discuss any questions you have  with your health care provider. Document Released: 11/07/2013 Document Revised: 01/01/2016 Document Reviewed: 01/01/2016 Elsevier Interactive Patient Education  2017 [employer].

## 2018-02-23 NOTE — Progress Notes (Signed)
42 y.o. [id number] divorced Caucasian female here for annual exam.    Heavy menses.  Having yearly iron infusions due to anemia.  Menses last - spot for 3 days, heavy for 4 days with super plus tampon change every 2 hours. Not a lot of cramping.  Declines STD screening.   States she never had a colonoscopy.   28 and 52 yo children.  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   PCP:   Shawnie Dapper, MD  Patient's last menstrual period was 02/23/2018 (exact date).           Sexually active: Yes.    The current method of family planning is vasectomy.    Exercising: No.   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 10-18-02 diverticulitis-repeat 5 years but not done.  Patient states she did not have a colonoscopy done ever.  TDaP:  06-27-15 Gardasil:   no HIV: 03-17-15 NR Hep C: n/a Screening Labs: PCP   reports that she has never smoked. She has never used smokeless tobacco. She reports that she does not drink alcohol or use drugs.  Past Medical History:  Diagnosis Date  . Allergic rhinitis   . History of ectopic pregnancy 2009   left, tube preserved  . Iron deficiency 10/2015   w/out anemia (from pt's menorrhagia). Symptomatic : fatigue and hair loss.  Intol of oral iron.  Hematology saw her 12/25/15 and did feraheme x 2 doses.  Pt's symptoms abated nicely with this.  Recheck of iron studies 04/2016 were good--no infusion needed.  Ongoing f/u with hematology: 11/2016 and 11/2017--Dr. Burr Medico started regularly scheduled feraheme treatments.  . Menorrhagia    needs to discuss tx options other than oral contraceptives with her GYN.  . Obesity, Class II, BMI 35-39.9 09/29/2007   Qualifier: Diagnosis of  By: Wynona Luna   . TMJ pain dysfunction syndrome 2015   (causing otalgia/referred pain)-ENT is Dr. Redmond Baseman  . Vitamin D deficiency      Past Surgical History:  Procedure Laterality Date  . CESAREAN SECTION      2007 & 2010  . CHOLECYSTECTOMY     2004 (for dysfunctional GB not for stones)  . COLONOSCOPY  10/18/2002   Severe diverticulosis from transverse to sigmoid colon, with increased spasm and tortuosity: likely cause of her urgency.  Recall 5 yrs (Dr. Lyla Son).  Marland Kitchen LAPAROSCOPY FOR ECTOPIC PREGNANCY  07/26/2007   salpingostomy for ectopic  . 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 TYMPANOSTOMY TUBE PLACEMENT  as a child    No current outpatient medications on file.   No current facility-administered medications for this visit.     Family History  Problem Relation Age of Onset  . Bipolar disorder Mother   . Diabetes Mother   . Hypertension Father   . Peripheral vascular disease Maternal Grandmother        died during carotid surgery    Review of Systems  Genitourinary: Positive for menstrual problem.  All other systems reviewed and are negative.   Exam:   BP 110/66 (BP Location: Right Arm, Patient Position: Sitting, Cuff Size: Large)   Pulse 96   Resp 18   Ht 5\' 3"  (1.6 m)   Wt 223 lb 3.2 oz (101.2 kg)   LMP 02/23/2018 (Exact Date)  BMI 39.54 kg/m     General appearance: alert, cooperative and appears stated age Head: Normocephalic, without obvious abnormality, atraumatic Neck: no adenopathy, supple, symmetrical, trachea midline and thyroid normal to inspection and palpation Lungs: clear to auscultation bilaterally Breasts: normal appearance, no masses or tenderness, No nipple retraction or dimpling, No nipple discharge or bleeding, No axillary or supraclavicular adenopathy Heart: regular rate and rhythm Abdomen: soft, non-tender; no masses, no organomegaly Extremities: extremities normal, atraumatic, no cyanosis or edema Skin: Skin color, texture, turgor normal. No rashes or lesions Lymph nodes: Cervical, supraclavicular, and axillary nodes normal. No abnormal inguinal nodes  palpated Neurologic: Grossly normal  Pelvic: External genitalia:  no lesions              Urethra:  normal appearing urethra with no masses, tenderness or lesions              Bartholins and Skenes: normal                 Vagina: normal appearing vagina with normal color and discharge, no lesions              Cervix: no lesions              Pap taken: Yes.   Bimanual Exam:  Uterus:  normal size, contour, position, consistency, mobility, non-tender              Adnexa: no mass, fullness, tenderness              Rectal exam: Yes.  .  Confirms.              Anus:  normal sphincter tone, no lesions  Chaperone was present for exam.  Assessment:   Well woman visit with normal exam. Hx CIN III.  Status post LEEP x 2.  Hx ectopic pregnancy.  Vasectomy for contraception.   Plan: Mammogram screening. Recommended self breast awareness. Pap and HR HPV as above. Guidelines for Calcium, Vitamin D, regular exercise program including cardiovascular and weight bearing exercise. Discussed Gardasil vaccine.  Return for pelvic ultrasound and plan for tx of menorrhagia. Follow up annually and prn.    After visit summary provided.

## 2018-02-27 ENCOUNTER — Telehealth: Payer: Self-pay | Admitting: Obstetrics and Gynecology

## 2018-02-27 NOTE — Telephone Encounter (Signed)
Call placed to patient to review benefits and schedule ultrasound. Unable to leave a message, as voice mailbox was full

## 2018-02-28 LAB — CYTOLOGY - PAP
Diagnosis: NEGATIVE
HPV: NOT DETECTED

## 2018-03-02 NOTE — Telephone Encounter (Signed)
Spoke with patient regarding benefit for ultrasound Patient understood and agreeable. Patient ready to schedule, but requested earlier appointment time, due to her teaching scheduled. Patient scheduled 03/16/18 at 8:00 AM with Dr Quincy Simmonds.  Patient aware of appointment date, arrival time and cancellation policy. No further questions. Ok to close

## 2018-03-16 ENCOUNTER — Ambulatory Visit (INDEPENDENT_AMBULATORY_CARE_PROVIDER_SITE_OTHER): Payer: BC Managed Care – PPO

## 2018-03-16 ENCOUNTER — Other Ambulatory Visit: Payer: Self-pay

## 2018-03-16 ENCOUNTER — Ambulatory Visit (INDEPENDENT_AMBULATORY_CARE_PROVIDER_SITE_OTHER): Payer: BC Managed Care – PPO | Admitting: Obstetrics and Gynecology

## 2018-03-16 ENCOUNTER — Encounter: Payer: Self-pay | Admitting: Obstetrics and Gynecology

## 2018-03-16 VITALS — BP 110/64 | HR 60 | Ht 63.0 in | Wt 223.0 lb

## 2018-03-16 DIAGNOSIS — N8003 Adenomyosis of the uterus: Secondary | ICD-10-CM

## 2018-03-16 DIAGNOSIS — N920 Excessive and frequent menstruation with regular cycle: Secondary | ICD-10-CM | POA: Diagnosis not present

## 2018-03-16 DIAGNOSIS — N939 Abnormal uterine and vaginal bleeding, unspecified: Secondary | ICD-10-CM | POA: Insufficient documentation

## 2018-03-16 HISTORY — DX: Adenomyosis of the uterus: N80.03

## 2018-03-16 NOTE — Progress Notes (Signed)
GYNECOLOGY  VISIT   HPI: 42 y.o.   Divorced  Caucasian  female   [phone] with Patient's last menstrual period was 02/23/2018 (exact date).   here for pelvic ultrasound.    Has menorrhagia and hx of anemia.  No bleeding outside of her menstrual cycle. No significant pain.   Hgb 02/13/18 - 14.3.   Had an iron infusion at the end of August.  She does this once yearly.  This is the third year in a row for her to do this.  Her menstruation has been heavy during this whole time.  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  Does have vision deficits.   GYNECOLOGIC HISTORY: Patient's last menstrual period was 02/23/2018 (exact date). Contraception: Vasectomy - current partner.  Menopausal hormone therapy:  none Last mammogram:  never Last pap smear: 02-23-18 Neg:Neg HR HPV, 05-18-16 Neg:Neg HR HPV        OB History    Gravida  5   Para  2   Term  2   Preterm  0   AB  3   Living  2     SAB  2   TAB  0   Ectopic  1   Multiple  0   Live Births  2              Patient Active Problem List   Diagnosis Date Noted  . Need for prophylactic vaccination and inoculation against influenza 01/28/2016  . Menorrhagia with regular cycle 12/31/2015  . Iron deficiency 12/18/2015  . CAP (community acquired pneumonia) 07/20/2013  . History of loop electrical excision procedure (LEEP) 10/16/2012  . Plantar fasciitis of left foot 07/12/2012    Past Medical History:  Diagnosis Date  . Allergic rhinitis   . History of ectopic pregnancy 2009   left, tube preserved  . Iron deficiency 10/2015   w/out anemia (from pt's menorrhagia). Symptomatic : fatigue and hair loss.  Intol of oral iron.  Hematology saw her 12/25/15 and did feraheme x 2 doses.  Pt's symptoms abated nicely with this.  Recheck of iron studies 04/2016 were good--no infusion needed.  Ongoing f/u with hematology: 11/2016 and 11/2017--Dr. Burr Medico started regularly scheduled feraheme treatments.  . Menorrhagia    needs to discuss tx  options other than oral contraceptives with her GYN.  . Obesity, Class II, BMI 35-39.9 09/29/2007   Qualifier: Diagnosis of  By: Wynona Luna   . Ocular migraine   . TMJ pain dysfunction syndrome 2015   (causing otalgia/referred pain)-ENT is Dr. Redmond Baseman  . Vitamin D deficiency     Past Surgical History:  Procedure Laterality Date  . CESAREAN SECTION      2007 & 2010  . CHOLECYSTECTOMY     2004 (for dysfunctional GB not for stones)  . COLONOSCOPY  10/18/2002   Severe diverticulosis from transverse to sigmoid colon, with increased spasm and tortuosity: likely cause of her urgency.  Recall 5 yrs (Dr. Lyla Son).  Marland Kitchen LAPAROSCOPY FOR ECTOPIC PREGNANCY  07/26/2007   salpingostomy for ectopic  . 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 TYMPANOSTOMY TUBE PLACEMENT  as a child    No current outpatient medications on file.   No current facility-administered medications for this visit.      ALLERGIES: Sulfa antibiotics  Family History  Problem Relation Age of Onset  . Bipolar disorder Mother   . Diabetes Mother   . Hypertension Father   . Peripheral vascular disease Maternal  Grandmother        died during carotid surgery    Social History   Socioeconomic History  . Marital status: Married    Spouse name: Not on file  . Number of children: 2  . Years of education: Not on file  . Highest education level: Not on file  Occupational History    Employer: [employer]  . Financial resource strain: Not on file  . Food insecurity:    Worry: Not on file    Inability: Not on file  . Transportation needs:    Medical: Not on file    Non-medical: Not on file  Tobacco Use  . Smoking status: Never Smoker  . Smokeless tobacco: Never Used  Substance and Sexual Activity  . Alcohol use: No  . Drug use: No  . Sexual activity: Yes    Partners: Male    Birth control/protection: Other-see comments    Comment: Vasectomy   Lifestyle  . Physical activity:    Days per week: Not  on file    Minutes per session: Not on file  . Stress: Not on file  Relationships  . Social connections:    Talks on phone: Not on file    Gets together: Not on file    Attends religious service: Not on file    Active member of club or organization: Not on file    Attends meetings of clubs or organizations: Not on file    Relationship status: Not on file  . Intimate partner violence:    Fear of current or ex partner: Not on file    Emotionally abused: Not on file    Physically abused: Not on file    Forced sexual activity: Not on file  Other Topics Concern  . Not on file  Social History Narrative   Married--separated.   College graduate.   Homemaker.   1 daughter   1 son   No T/A/Ds.                Review of Systems  All other systems reviewed and are negative.   PHYSICAL EXAMINATION:    BP 110/64 (BP Location: Right Arm, Patient Position: Sitting, Cuff Size: Large)   Pulse 60   Ht 5\' 3"  (1.6 m)   Wt 223 lb (101.2 kg)   LMP 02/23/2018 (Exact Date)   BMI 39.50 kg/m     General appearance: alert, cooperative and appears stated age  Uterus with possible adenomyosis.  EMS 11.41 mm.  Ovaries normal. No free fluid.   ASSESSMENT  Menorrhagia.  Possible adenomyosis by [place].  Hx C/S x 2.  Hx LEEP x 2.  Ocular migraines. Not married.  Current partner with vasectomy.   PLAN  We discussed progesterone options - Micronor, Nexplanon, Depo Provera, Hysterectomy.  Will precert Mirena. Risks and benefits reviewed.  Brochure given. Will plan for Cytotec and a paracervical block.  Is still considering hysterectomy with bilateral salpingectomy for next summer.  Will need an endometrial biopsy if wants hysterectomy.    An After Visit Summary was printed and given to the patient.  _25_____ minutes face to face time of which over 50% was spent in counseling.

## 2018-03-26 ENCOUNTER — Encounter: Payer: Self-pay | Admitting: Family Medicine

## 2018-05-16 ENCOUNTER — Other Ambulatory Visit: Payer: Self-pay | Admitting: Family Medicine

## 2018-05-16 DIAGNOSIS — Z1231 Encounter for screening mammogram for malignant neoplasm of breast: Secondary | ICD-10-CM

## 2018-05-30 ENCOUNTER — Telehealth: Payer: Self-pay | Admitting: Hematology

## 2018-05-30 NOTE — Telephone Encounter (Signed)
Called pt per 2/4 sch message- no answer - left message for patient to call back to r/s

## 2018-06-01 ENCOUNTER — Inpatient Hospital Stay: Payer: BC Managed Care – PPO

## 2018-06-06 ENCOUNTER — Ambulatory Visit
Admission: RE | Admit: 2018-06-06 | Discharge: 2018-06-06 | Disposition: A | Discharge status: Home / Self Care | Payer: BC Managed Care – PPO | Source: Ambulatory Visit | Attending: Family Medicine | Admitting: Family Medicine

## 2018-06-06 DIAGNOSIS — Z1231 Encounter for screening mammogram for malignant neoplasm of breast: Secondary | ICD-10-CM

## 2018-06-13 ENCOUNTER — Inpatient Hospital Stay: Payer: BC Managed Care – PPO | Attending: Hematology

## 2018-06-13 DIAGNOSIS — Z79899 Other long term (current) drug therapy: Secondary | ICD-10-CM | POA: Insufficient documentation

## 2018-06-13 DIAGNOSIS — D509 Iron deficiency anemia, unspecified: Secondary | ICD-10-CM | POA: Insufficient documentation

## 2018-06-13 DIAGNOSIS — E559 Vitamin D deficiency, unspecified: Secondary | ICD-10-CM | POA: Diagnosis not present

## 2018-06-13 DIAGNOSIS — E611 Iron deficiency: Secondary | ICD-10-CM

## 2018-06-13 LAB — CBC WITH DIFFERENTIAL (CANCER CENTER ONLY)
Abs Immature Granulocytes: 0.02 10*3/uL (ref 0.00–0.07)
BASOS PCT: 1 %
Basophils Absolute: 0 10*3/uL (ref 0.0–0.1)
EOS PCT: 3 %
Eosinophils Absolute: 0.3 10*3/uL (ref 0.0–0.5)
HCT: 39.3 % (ref 36.0–46.0)
HEMOGLOBIN: 13 g/dL (ref 12.0–15.0)
Immature Granulocytes: 0 %
LYMPHS PCT: 36 %
Lymphs Abs: 2.8 10*3/uL (ref 0.7–4.0)
MCH: 29.3 pg (ref 26.0–34.0)
MCHC: 33.1 g/dL (ref 30.0–36.0)
MCV: 88.7 fL (ref 80.0–100.0)
MONOS PCT: 7 %
Monocytes Absolute: 0.6 10*3/uL (ref 0.1–1.0)
Neutro Abs: 4 10*3/uL (ref 1.7–7.7)
Neutrophils Relative %: 53 %
Platelet Count: 266 10*3/uL (ref 150–400)
RBC: 4.43 MIL/uL (ref 3.87–5.11)
RDW: 12.5 % (ref 11.5–15.5)
WBC: 7.6 10*3/uL (ref 4.0–10.5)
nRBC: 0 % (ref 0.0–0.2)

## 2018-06-13 LAB — RETICULOCYTES
Immature Retic Fract: 11.5 % (ref 2.3–15.9)
RBC.: 4.43 MIL/uL (ref 3.87–5.11)
RETIC CT PCT: 1.4 % (ref 0.4–3.1)
Retic Count, Absolute: 63.3 10*3/uL (ref 19.0–186.0)

## 2018-06-14 LAB — IRON AND TIBC
IRON: 37 ug/dL — AB (ref 41–142)
SATURATION RATIOS: 13 % — AB (ref 21–57)
TIBC: 283 ug/dL (ref 236–444)
UIBC: 245 ug/dL (ref 120–384)

## 2018-06-14 LAB — FERRITIN: Ferritin: 43 ng/mL (ref 11–307)

## 2018-06-15 ENCOUNTER — Telehealth: Payer: Self-pay

## 2018-06-15 NOTE — Telephone Encounter (Signed)
-----   Message from Truitt Merle, MD sent at 06/14/2018  6:59 PM EST ----- Please let her know the lab results, no anemia, but iron level has dropped again, mild iron deficiency. If she is symptomatic, please set up iv feraheme once for her, thanks   Truitt Merle  06/14/2018

## 2018-06-15 NOTE — Telephone Encounter (Signed)
Spoke with patient regarding lab results, per Dr. Burr Medico, no anemia, but iron level has dropped again, mild iron deficiency, if symptomatic Dr. Burr Medico recommends IV feraheme once for her, she states she is unsure if she can get it as it is very expensive.    She will call our office back and let us know.

## 2018-06-15 NOTE — Telephone Encounter (Signed)
See telephone message

## 2018-07-05 ENCOUNTER — Telehealth: Payer: Self-pay | Admitting: Obstetrics and Gynecology

## 2018-07-05 NOTE — Telephone Encounter (Signed)
Benefits was conveyed to patient for Mirena. Patient to return call at onset of cycle to date patient has not returned a call for scheduling. Please advise how to proceed.

## 2018-07-07 NOTE — Telephone Encounter (Signed)
Ok to close order for Mirena.

## 2018-10-25 ENCOUNTER — Telehealth: Payer: BC Managed Care – PPO | Admitting: Nurse Practitioner

## 2018-10-25 ENCOUNTER — Encounter: Payer: Self-pay | Admitting: Hematology

## 2018-10-25 DIAGNOSIS — R509 Fever, unspecified: Secondary | ICD-10-CM

## 2018-10-25 NOTE — Progress Notes (Signed)
E-Visit for Corona Virus Screening   Your current symptoms could be consistent with the coronavirus.  Call your health care provider or local health department to request and arrange formal testing. Many health care providers can now test patients at their office but not all are.  Please quarantine yourself while awaiting your test results.  [place] [phone], [place], [place] or visit [email or web address]     COVID-19 is a respiratory illness with symptoms that are similar to the flu. Symptoms are typically mild to moderate, but there have been cases of severe illness and death due to the virus. The following symptoms may appear 2-14 days after exposure: . Fever . Cough . Shortness of breath or difficulty breathing . Chills . Repeated shaking with chills . Muscle pain . Headache . Sore throat . New loss of taste or smell . Fatigue . Congestion or runny nose . Nausea or vomiting . Diarrhea  It is vitally important that if you feel that you have an infection such as this virus or any other virus that you stay home and away from places where you may spread it to others.  You should self-quarantine for 14 days if you have symptoms that could potentially be coronavirus or have been in close contact a with a person diagnosed with COVID-19 within the last 2 weeks. You should avoid contact with people age 4 and older.   You should wear a mask or cloth face covering over your nose and mouth if you must be around other people or animals, including pets (even at home). Try to stay at least 6 feet away from other people. This will protect the people around you.  You can use medication such as delsym or mucinex if develop a cough   You may also take acetaminophen (Tylenol) as needed for fever.   Reduce your risk of any infection by using  the same precautions used for avoiding the common cold or flu:  Marland Kitchen Wash your hands often with soap and warm water for at least 20 seconds.  If soap and water are not readily available, use an alcohol-based hand sanitizer with at least 60% alcohol.  . If coughing or sneezing, cover your mouth and nose by coughing or sneezing into the elbow areas of your shirt or coat, into a tissue or into your sleeve (not your hands). . Avoid shaking hands with others and consider head nods or verbal greetings only. . Avoid touching your eyes, nose, or mouth with unwashed hands.  . Avoid close contact with people who are sick. . Avoid places or events with large numbers of people in one location, like concerts or sporting events. . Carefully consider travel plans you have or are making. . If you are planning any travel outside or inside the [place], visit the CDC's Travelers' Health webpage for the latest health notices. . If you have some symptoms but not all symptoms, continue to monitor at home and seek medical attention if your symptoms worsen. . If you are having a medical emergency, call 911.  HOME CARE . Only take medications as instructed by your medical team. . Drink plenty of fluids and get plenty of rest. . A steam or ultrasonic humidifier can help if you have congestion.   GET HELP RIGHT AWAY IF YOU HAVE EMERGENCY WARNING SIGNS** FOR COVID-19. If you or someone is showing any of these signs seek emergency medical care immediately. Call 911 or proceed  to your closest emergency facility if: . You develop worsening high fever. . Trouble breathing . Bluish lips or face . Persistent pain or pressure in the chest . New confusion . Inability to wake or stay awake . You cough up blood. . Your symptoms become more severe  **This list is not all possible symptoms. Contact your medical provider for any symptoms that are sever or concerning to you.   MAKE SURE YOU   Understand these instructions.  Will  watch your condition.  Will get help right away if you are not doing well or get worse.  Your e-visit answers were reviewed by a board certified advanced clinical practitioner to complete your personal care plan.  Depending on the condition, your plan could have included both over the counter or prescription medications.  If there is a problem please reply once you have received a response from your provider.  Your safety is important to [place].  If you have drug allergies check your prescription carefully.    You can use MyChart to ask questions about today's visit, request a non-urgent call back, or ask for a work or school excuse for 24 hours related to this e-Visit. If it has been greater than 24 hours you will need to follow up with your provider, or enter a new e-Visit to address those concerns. You will get an e-mail in the next two days asking about your experience.  I hope that your e-visit has been valuable and will speed your recovery. Thank you for using e-visits.   5-10 minutes spent reviewing and documenting in chart.

## 2018-10-26 ENCOUNTER — Telehealth: Payer: Self-pay | Admitting: Hematology

## 2018-10-26 NOTE — Telephone Encounter (Signed)
Scheduled appt per 7/1 sch message- pt aware of appt date and time   

## 2018-11-07 ENCOUNTER — Telehealth: Payer: Self-pay | Admitting: Hematology

## 2018-11-07 NOTE — Telephone Encounter (Signed)
YF out 7/20 moved appointments to 7/24. Confirmed with patient.  Patient cannot come 7/22.

## 2018-11-10 NOTE — Progress Notes (Signed)
[name]   [phone] [phone]   Clinic Follow up Note   Patient Care Team: Tammi Sou, MD as PCP - General (Family Medicine) [name], DPM as Consulting Physician (Podiatry) Jacqueline Rising, MD as Consulting Physician (Obstetrics and Gynecology) Jacqueline Napoleon, MD as Consulting Physician (Oncology) Jacqueline Merle, MD as Consulting Physician (Hematology) [name], Everardo All, MD as Consulting Physician (Obstetrics and Gynecology)  Date of Service:  11/17/2018  CHIEF COMPLAINT: Iron Deficiency Anemia  CURRENT THERAPY:  IV Feraheme 510mg  as needed, last in 11/2017  INTERVAL HISTORY:  IVAH Barr is here for a follow up IDA. She was last sen by me 11 months ago. She presents to the clinic alone. She notes she is doing well. She notes she is more tired lately, but not impacting what she does. She also notes racing of her heart and heart palpitation bother her more.  She notes she has not been able to take Vitamin D orally because she would forget. She notes she is about to start her period which are still heavy. She has not been able to see her Gyn about IUD due to COVID-19. She notes she was told she possibly has Adenomyosis which would be treated by hysterectomy. She is not sure she wants that. She notes even with insurance she pays $2000 for first dose in a year and the second dose is covered because of her deductible.    REVIEW OF SYSTEMS:   Constitutional: Denies fevers, chills or abnormal weight loss (+) fatigue  Eyes: Denies blurriness of vision Ears, nose, mouth, throat, and face: Denies mucositis or sore throat Respiratory: Denies cough, dyspnea or wheezes Cardiovascular: Denies chest discomfort or lower extremity swelling (+) heart palpitation and tachycardia Gastrointestinal:  Denies nausea, heartburn or change in bowel habits Skin: Denies abnormal skin rashes Lymphatics: Denies new lymphadenopathy or easy bruising  Neurological:Denies numbness, tingling or new weaknesses Behavioral/Psych: Mood is stable, no new changes  All other systems were reviewed with the patient and are negative.  MEDICAL HISTORY:  Past Medical History:  Diagnosis Date  . Allergic rhinitis   . History of ectopic pregnancy 2009   left, tube preserved  . Iron deficiency 10/2015   w/out anemia (from pt's menorrhagia). Symptomatic : fatigue and hair loss.  Intol of oral iron.  Hematology saw her 12/25/15 and did feraheme x 2 doses.  Pt's symptoms abated nicely with this.  Recheck of iron studies 04/2016 were good--no infusion needed.  Ongoing f/u with hematology: 11/2016 and 11/2017--Dr. Burr Medico started regularly scheduled feraheme treatments.  . Menorrhagia    pelvic ultrasound 02/2018-->possible adenomyosis per GYN  . Obesity, Class II, BMI 35-39.9 09/29/2007   Qualifier: Diagnosis of  By: Wynona Luna   . Ocular migraine   . TMJ pain dysfunction syndrome 2015   (causing otalgia/referred pain)-ENT is Dr. Redmond Baseman  . Vitamin D deficiency     SURGICAL HISTORY: Past Surgical History:  Procedure Laterality Date  . CESAREAN SECTION      2007 & 2010  . CHOLECYSTECTOMY     2004 (for dysfunctional GB not for stones)  . COLONOSCOPY  10/18/2002   Severe diverticulosis from transverse to sigmoid colon, with increased spasm and tortuosity: likely cause of her urgency.  Recall 5 yrs (Dr. Lyla Son).  Marland Kitchen LAPAROSCOPY FOR ECTOPIC PREGNANCY  07/26/2007   salpingostomy for ectopic  . 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 TYMPANOSTOMY TUBE PLACEMENT  as a child    I have reviewed the social history and family history with the patient and they are unchanged from previous note.  ALLERGIES:  is allergic to sulfa antibiotics.  MEDICATIONS:  No current outpatient medications on file.   No current facility-administered medications for this visit.     PHYSICAL EXAMINATION: ECOG PERFORMANCE STATUS: 1 - Symptomatic but completely ambulatory   Vitals:   11/17/18 1138  BP: 107/76  Pulse: 70  Resp: 18  Temp: 98.5 F (36.9 C)  SpO2: 100%   Filed Weights   11/17/18 1138  Weight: 225 lb 6.4 oz (102.2 kg)    GENERAL:alert, no distress and comfortable SKIN: skin color, texture, turgor are normal, no rashes or significant lesions EYES: normal, Conjunctiva are pink and non-injected, sclera clear  NECK: supple, thyroid normal size, non-tender, without nodularity LYMPH:  no palpable lymphadenopathy in the cervical, axillary  LUNGS: clear to auscultation and percussion with normal breathing effort HEART: regular rate & rhythm and no murmurs and no lower extremity edema ABDOMEN:abdomen soft, non-tender and normal bowel sounds Musculoskeletal:no cyanosis of digits and no clubbing  NEURO: alert & oriented x 3 with fluent speech, no focal motor/sensory deficits  LABORATORY DATA:  I have reviewed the data as listed CBC Latest Ref Rng & Units 11/17/2018 06/13/2018 02/13/2018  WBC 4.0 - 10.5 K/uL 7.0 7.6 9.9  Hemoglobin 12.0 - 15.0 g/dL 13.5 13.0 14.3  Hematocrit 36.0 - 46.0 % 40.0 39.3 41.6  Platelets 150 - 400 K/uL 222 266 343.0     CMP Latest Ref Rng & Units 02/13/2018 12/18/2015 03/17/2015  Glucose 70 - 99 mg/dL 79 90 83  BUN 6 - 23 mg/dL 16 9 11   Creatinine 0.40 - 1.20 mg/dL 0.69 1.1 0.77  Sodium 135 - 145 mEq/L 139 135 141  Potassium 3.5 - 5.1 mEq/L 4.2 4.3 5.1  Chloride 96 - 112 mEq/L 104 104 107  CO2 19 - 32 mEq/L 29 28 27   Calcium 8.4 - 10.5 mg/dL 9.0 8.8 9.0  Total Protein 6.0 - 8.3 g/dL 6.4 6.6 6.2  Total Bilirubin 0.2 - 1.2 mg/dL 0.4 0.80 0.3  Alkaline Phos 39 - 117 U/L 54 72 66  AST 0 - 37 U/L 10 18 14   ALT 0 - 35 U/L 9 13 8       RADIOGRAPHIC STUDIES: I have personally reviewed the radiological images as listed and agreed with the findings in the report. No results found.   ASSESSMENT & PLAN:  [name] is a 43 y.o. female with   1. Iron Deficiency secondary to menorrhagia, without anemia  -Her  initial lab work was consistent with iron deficiency, no anemia. Her B12 level and erythropoietin level was normal, hemoglobin electrophoresis was normal. -Her iron deficiency is secondary to menorrhagia, no clinical suspicions for other bleeding. -Also she is not anemic, she is symptomatic from iron deficiency, including hair loss, palpitation and fatigue. -She cannot tolerate oral iron pills. She previously responded well to IV our in 11/2015 and 11/2016. Then again in 11/2017, averaging 2 doses a year.  -Continue monitoring her I'll check every 6 months. -She still has monorrhigia. She has not been able to see her Gyn about IUD or discuss the option of hysterectomy given her possible adenomyapthy. She is apprehensive about both, she will think about it. I encourage her to consider ICU to slow down her menorrhagia  -Labs reviewed, CBC and reticulocytes WNL. Vit D and iron panel still pending. Will proceed with  IV Feraheme today.  -Given her insurance coverage and high out pocket cost ($2000 for first dose), I discussed the option of changing IV iron product. She is interested for future use. I will check with pharmacy  -f/u in 1 year   2. Vitamin D deficiency -She was previously on 50,000 IU every week intermittently -She has not been able to remember daily vitamin D 2000 units daily.   -She would like to return to weekly 50,000 IU dose. If Vit D level low today, will prescribed, otherwise she is fine to stop supplement.    PLAN -She is clinically doing well  -Proceed with IV Ferahame today, if ferritin very low, will give one more dose next week  -Lab in f/u in 4 and 5 months  -lab and f/u in 1 year   No problem-specific Assessment & Plan notes found for this encounter.   No orders of the defined types were placed in this encounter.  All questions were answered. The patient knows to call the clinic with any problems, questions or concerns. No barriers to learning was detected. I spent  10 minutes counseling the patient face to face. The total time spent in the appointment was 15 minutes and more than 50% was on counseling and review of test results     Jacqueline Merle, MD 11/17/2018   I, Joslyn Devon, am acting as scribe for Jacqueline Merle, MD.   I have reviewed the above documentation for accuracy and completeness, and I agree with the above.

## 2018-11-13 ENCOUNTER — Ambulatory Visit: Payer: BC Managed Care – PPO | Admitting: Hematology

## 2018-11-13 ENCOUNTER — Ambulatory Visit: Payer: BC Managed Care – PPO

## 2018-11-13 ENCOUNTER — Other Ambulatory Visit: Payer: BC Managed Care – PPO

## 2018-11-15 ENCOUNTER — Ambulatory Visit: Payer: BC Managed Care – PPO

## 2018-11-15 ENCOUNTER — Other Ambulatory Visit: Payer: BC Managed Care – PPO

## 2018-11-15 ENCOUNTER — Ambulatory Visit: Payer: BC Managed Care – PPO | Admitting: Hematology

## 2018-11-17 ENCOUNTER — Telehealth: Payer: Self-pay | Admitting: Hematology

## 2018-11-17 ENCOUNTER — Inpatient Hospital Stay: Payer: BC Managed Care – PPO | Admitting: Hematology

## 2018-11-17 ENCOUNTER — Inpatient Hospital Stay: Payer: BC Managed Care – PPO | Attending: Hematology

## 2018-11-17 ENCOUNTER — Other Ambulatory Visit: Payer: Self-pay

## 2018-11-17 ENCOUNTER — Inpatient Hospital Stay: Payer: BC Managed Care – PPO

## 2018-11-17 ENCOUNTER — Encounter: Payer: Self-pay | Admitting: Hematology

## 2018-11-17 VITALS — BP 107/76 | HR 70 | Temp 98.5°F | Resp 18 | Ht 63.0 in | Wt 225.4 lb

## 2018-11-17 VITALS — BP 123/79 | HR 68 | Temp 98.0°F | Resp 16

## 2018-11-17 DIAGNOSIS — E559 Vitamin D deficiency, unspecified: Secondary | ICD-10-CM

## 2018-11-17 DIAGNOSIS — E669 Obesity, unspecified: Secondary | ICD-10-CM

## 2018-11-17 DIAGNOSIS — R002 Palpitations: Secondary | ICD-10-CM | POA: Insufficient documentation

## 2018-11-17 DIAGNOSIS — N920 Excessive and frequent menstruation with regular cycle: Secondary | ICD-10-CM

## 2018-11-17 DIAGNOSIS — R5383 Other fatigue: Secondary | ICD-10-CM | POA: Diagnosis not present

## 2018-11-17 DIAGNOSIS — E611 Iron deficiency: Secondary | ICD-10-CM

## 2018-11-17 DIAGNOSIS — D509 Iron deficiency anemia, unspecified: Secondary | ICD-10-CM | POA: Insufficient documentation

## 2018-11-17 DIAGNOSIS — Z79899 Other long term (current) drug therapy: Secondary | ICD-10-CM | POA: Insufficient documentation

## 2018-11-17 LAB — CBC WITH DIFFERENTIAL/PLATELET
Abs Immature Granulocytes: 0.02 10*3/uL (ref 0.00–0.07)
Basophils Absolute: 0.1 10*3/uL (ref 0.0–0.1)
Basophils Relative: 1 %
Eosinophils Absolute: 0.1 10*3/uL (ref 0.0–0.5)
Eosinophils Relative: 1 %
HCT: 40 % (ref 36.0–46.0)
Hemoglobin: 13.5 g/dL (ref 12.0–15.0)
Immature Granulocytes: 0 %
Lymphocytes Relative: 27 %
Lymphs Abs: 1.9 10*3/uL (ref 0.7–4.0)
MCH: 28.2 pg (ref 26.0–34.0)
MCHC: 33.8 g/dL (ref 30.0–36.0)
MCV: 83.7 fL (ref 80.0–100.0)
Monocytes Absolute: 0.7 10*3/uL (ref 0.1–1.0)
Monocytes Relative: 10 %
Neutro Abs: 4.2 10*3/uL (ref 1.7–7.7)
Neutrophils Relative %: 61 %
Platelets: 222 10*3/uL (ref 150–400)
RBC: 4.78 MIL/uL (ref 3.87–5.11)
RDW: 13.2 % (ref 11.5–15.5)
WBC: 7 10*3/uL (ref 4.0–10.5)
nRBC: 0 % (ref 0.0–0.2)

## 2018-11-17 LAB — FERRITIN: Ferritin: 53 ng/mL (ref 11–307)

## 2018-11-17 LAB — IRON AND TIBC
Iron: 70 ug/dL (ref 41–142)
Saturation Ratios: 22 % (ref 21–57)
TIBC: 314 ug/dL (ref 236–444)
UIBC: 243 ug/dL (ref 120–384)

## 2018-11-17 LAB — RETICULOCYTES
Immature Retic Fract: 10.7 % (ref 2.3–15.9)
RBC.: 4.78 MIL/uL (ref 3.87–5.11)
Retic Count, Absolute: 57.8 10*3/uL (ref 19.0–186.0)
Retic Ct Pct: 1.2 % (ref 0.4–3.1)

## 2018-11-17 MED ORDER — SODIUM CHLORIDE 0.9 % IV SOLN
510.0000 mg | Freq: Once | INTRAVENOUS | Status: AC
  Administered 2018-11-17: 510 mg via INTRAVENOUS
  Filled 2018-11-17: qty 17

## 2018-11-17 MED ORDER — SODIUM CHLORIDE 0.9 % IV SOLN
Freq: Once | INTRAVENOUS | Status: AC
  Administered 2018-11-17: 13:00:00 via INTRAVENOUS
  Filled 2018-11-17: qty 250

## 2018-11-17 NOTE — Patient Instructions (Signed)

## 2018-11-17 NOTE — Telephone Encounter (Signed)
Scheduled appt per 7/24 los. ° °Printed and mailed appt calendar °

## 2018-11-17 NOTE — Progress Notes (Signed)
I s/w pt over phone while she was in infusion.  We discussed the possibility of switching to either Injectafer or Venofer products in order to get copay assistance w/ [employer] (per recommendation of [employer], drug replacement specialist).  I s/w Stefanie Libel, in the absence of [employer].  Marguarite Arbour suggested pt talk w/ Lenise when she returns next week.  Lenise may be able to research the copay assistance program. If pt would qualify for copay assistance, we would need MD to change the order to the qualifying Iron product and get prior authorization w/ BCBS for the change.  Pt has Lenise White's contact information and plans to call her next week to discuss.  Kennith Center, Pharm.D., CPP 11/17/2018@1 :46 PM

## 2018-11-18 LAB — VITAMIN D 25 HYDROXY (VIT D DEFICIENCY, FRACTURES): Vit D, 25-Hydroxy: 20.7 ng/mL — ABNORMAL LOW (ref 30.0–100.0)

## 2018-11-22 ENCOUNTER — Other Ambulatory Visit: Payer: Self-pay

## 2018-11-22 DIAGNOSIS — E559 Vitamin D deficiency, unspecified: Secondary | ICD-10-CM

## 2018-11-22 MED ORDER — ERGOCALCIFEROL 1.25 MG (50000 UT) PO CAPS: 50000.0000 [IU] | ORAL_CAPSULE | ORAL | 0 refills | Status: DC

## 2018-11-23 ENCOUNTER — Inpatient Hospital Stay: Payer: BC Managed Care – PPO

## 2018-11-28 ENCOUNTER — Encounter: Payer: Self-pay | Admitting: Family Medicine

## 2018-12-07 ENCOUNTER — Inpatient Hospital Stay: Payer: BC Managed Care – PPO | Admitting: Hematology

## 2019-01-14 ENCOUNTER — Other Ambulatory Visit: Payer: Self-pay | Admitting: Hematology

## 2019-01-14 DIAGNOSIS — E559 Vitamin D deficiency, unspecified: Secondary | ICD-10-CM

## 2019-01-23 ENCOUNTER — Encounter: Payer: Self-pay | Admitting: Hematology

## 2019-01-23 ENCOUNTER — Other Ambulatory Visit: Payer: Self-pay | Admitting: Hematology

## 2019-01-23 DIAGNOSIS — R5383 Other fatigue: Secondary | ICD-10-CM

## 2019-01-24 ENCOUNTER — Telehealth: Payer: Self-pay | Admitting: Hematology

## 2019-01-24 NOTE — Telephone Encounter (Signed)
Scheduled appt per 9/29 sch message - pt aware of appt date and time   

## 2019-01-25 ENCOUNTER — Inpatient Hospital Stay: Payer: BC Managed Care – PPO | Attending: Hematology

## 2019-01-25 ENCOUNTER — Other Ambulatory Visit: Payer: Self-pay

## 2019-01-25 DIAGNOSIS — R5383 Other fatigue: Secondary | ICD-10-CM

## 2019-01-25 DIAGNOSIS — E611 Iron deficiency: Secondary | ICD-10-CM

## 2019-01-25 DIAGNOSIS — D509 Iron deficiency anemia, unspecified: Secondary | ICD-10-CM | POA: Insufficient documentation

## 2019-01-25 DIAGNOSIS — Z79899 Other long term (current) drug therapy: Secondary | ICD-10-CM | POA: Insufficient documentation

## 2019-01-26 LAB — FERRITIN: Ferritin: 54 ng/mL (ref 11–307)

## 2019-01-26 LAB — IRON AND TIBC
Iron: 54 ug/dL (ref 41–142)
Saturation Ratios: 20 % — ABNORMAL LOW (ref 21–57)
TIBC: 265 ug/dL (ref 236–444)
UIBC: 211 ug/dL (ref 120–384)

## 2019-01-28 ENCOUNTER — Encounter: Payer: Self-pay | Admitting: Hematology

## 2019-01-30 LAB — TSH: TSH: 0.758 u[IU]/mL (ref 0.308–3.960)

## 2019-02-02 ENCOUNTER — Ambulatory Visit: Payer: BC Managed Care – PPO | Admitting: Family Medicine

## 2019-02-08 ENCOUNTER — Telehealth: Payer: Self-pay | Admitting: Hematology

## 2019-02-08 NOTE — Telephone Encounter (Signed)
Scheduled appt per 10/12 sch message - pt is aware of appt date and time   

## 2019-02-15 ENCOUNTER — Inpatient Hospital Stay: Payer: BC Managed Care – PPO

## 2019-02-15 ENCOUNTER — Other Ambulatory Visit: Payer: Self-pay

## 2019-02-15 DIAGNOSIS — N920 Excessive and frequent menstruation with regular cycle: Secondary | ICD-10-CM

## 2019-02-15 DIAGNOSIS — E611 Iron deficiency: Secondary | ICD-10-CM

## 2019-02-15 DIAGNOSIS — D509 Iron deficiency anemia, unspecified: Secondary | ICD-10-CM | POA: Diagnosis not present

## 2019-02-15 MED ORDER — SODIUM CHLORIDE 0.9 % IV SOLN
510.0000 mg | Freq: Once | INTRAVENOUS | Status: AC
  Administered 2019-02-15: 15:00:00 510 mg via INTRAVENOUS
  Filled 2019-02-15: qty 510

## 2019-02-15 MED ORDER — SODIUM CHLORIDE 0.9 % IV SOLN
INTRAVENOUS | Status: DC
  Administered 2019-02-15: 15:00:00 via INTRAVENOUS
  Filled 2019-02-15: qty 250

## 2019-02-15 NOTE — Patient Instructions (Signed)

## 2019-03-01 ENCOUNTER — Ambulatory Visit: Payer: BC Managed Care – PPO | Admitting: Obstetrics and Gynecology

## 2019-03-05 ENCOUNTER — Encounter: Payer: Self-pay | Admitting: Hematology

## 2019-03-19 ENCOUNTER — Other Ambulatory Visit: Payer: BC Managed Care – PPO

## 2019-04-05 ENCOUNTER — Telehealth: Payer: Self-pay | Admitting: Hematology

## 2019-04-05 NOTE — Telephone Encounter (Signed)
Patient returned phone call regarding rescheduling 12/11 appointment, per patient's request appointment has moved to 12/14.

## 2019-04-05 NOTE — Telephone Encounter (Signed)
Returned patient's phone call regarding rescheduling an appointment, left a voicemail. 

## 2019-04-06 ENCOUNTER — Inpatient Hospital Stay: Payer: BC Managed Care – PPO

## 2019-04-09 ENCOUNTER — Other Ambulatory Visit: Payer: Self-pay

## 2019-04-09 ENCOUNTER — Inpatient Hospital Stay: Payer: BC Managed Care – PPO | Attending: Hematology

## 2019-04-09 DIAGNOSIS — R5383 Other fatigue: Secondary | ICD-10-CM

## 2019-04-09 DIAGNOSIS — D509 Iron deficiency anemia, unspecified: Secondary | ICD-10-CM | POA: Insufficient documentation

## 2019-04-09 DIAGNOSIS — E611 Iron deficiency: Secondary | ICD-10-CM

## 2019-04-09 LAB — CBC WITH DIFFERENTIAL (CANCER CENTER ONLY)
Abs Immature Granulocytes: 0.02 10*3/uL (ref 0.00–0.07)
Basophils Absolute: 0 10*3/uL (ref 0.0–0.1)
Basophils Relative: 0 %
Eosinophils Absolute: 0.1 10*3/uL (ref 0.0–0.5)
Eosinophils Relative: 2 %
HCT: 37.8 % (ref 36.0–46.0)
Hemoglobin: 12.7 g/dL (ref 12.0–15.0)
Immature Granulocytes: 0 %
Lymphocytes Relative: 37 %
Lymphs Abs: 2.5 10*3/uL (ref 0.7–4.0)
MCH: 30 pg (ref 26.0–34.0)
MCHC: 33.6 g/dL (ref 30.0–36.0)
MCV: 89.4 fL (ref 80.0–100.0)
Monocytes Absolute: 0.5 10*3/uL (ref 0.1–1.0)
Monocytes Relative: 8 %
Neutro Abs: 3.5 10*3/uL (ref 1.7–7.7)
Neutrophils Relative %: 53 %
Platelet Count: 258 10*3/uL (ref 150–400)
RBC: 4.23 MIL/uL (ref 3.87–5.11)
RDW: 12.7 % (ref 11.5–15.5)
WBC Count: 6.7 10*3/uL (ref 4.0–10.5)
nRBC: 0 % (ref 0.0–0.2)

## 2019-04-10 LAB — IRON AND TIBC
Iron: 44 ug/dL (ref 41–142)
Saturation Ratios: 18 % — ABNORMAL LOW (ref 21–57)
TIBC: 241 ug/dL (ref 236–444)
UIBC: 198 ug/dL (ref 120–384)

## 2019-04-10 LAB — FERRITIN: Ferritin: 116 ng/mL (ref 11–307)

## 2019-04-16 ENCOUNTER — Other Ambulatory Visit: Payer: Self-pay | Admitting: Hematology

## 2019-04-16 DIAGNOSIS — E559 Vitamin D deficiency, unspecified: Secondary | ICD-10-CM

## 2019-04-23 HISTORY — PX: COLPOSCOPY: SHX161

## 2019-04-29 ENCOUNTER — Encounter: Payer: Self-pay | Admitting: Family Medicine

## 2019-05-02 ENCOUNTER — Ambulatory Visit: Payer: BC Managed Care – PPO | Attending: Internal Medicine

## 2019-05-02 DIAGNOSIS — Z20822 Contact with and (suspected) exposure to covid-19: Secondary | ICD-10-CM

## 2019-05-03 LAB — NOVEL CORONAVIRUS, NAA: SARS-CoV-2, NAA: NOT DETECTED

## 2019-05-07 HISTORY — PX: OTHER SURGICAL HISTORY: SHX169

## 2019-05-07 HISTORY — PX: ENDOMETRIAL ABLATION: SHX621

## 2019-05-29 ENCOUNTER — Encounter: Payer: Self-pay | Admitting: Family Medicine

## 2019-05-30 ENCOUNTER — Encounter: Payer: Self-pay | Admitting: Family Medicine

## 2019-10-05 ENCOUNTER — Emergency Department (HOSPITAL_BASED_OUTPATIENT_CLINIC_OR_DEPARTMENT_OTHER)
Admission: EM | Admit: 2019-10-05 | Discharge: 2019-10-05 | Disposition: A | Discharge status: Home / Self Care | Payer: BC Managed Care – PPO | Attending: Emergency Medicine | Admitting: Emergency Medicine

## 2019-10-05 ENCOUNTER — Encounter (HOSPITAL_BASED_OUTPATIENT_CLINIC_OR_DEPARTMENT_OTHER): Payer: Self-pay

## 2019-10-05 ENCOUNTER — Other Ambulatory Visit: Payer: Self-pay

## 2019-10-05 DIAGNOSIS — M545 Low back pain: Secondary | ICD-10-CM | POA: Diagnosis not present

## 2019-10-05 DIAGNOSIS — M542 Cervicalgia: Secondary | ICD-10-CM | POA: Diagnosis present

## 2019-10-05 DIAGNOSIS — Z79899 Other long term (current) drug therapy: Secondary | ICD-10-CM | POA: Insufficient documentation

## 2019-10-05 DIAGNOSIS — M5412 Radiculopathy, cervical region: Secondary | ICD-10-CM | POA: Diagnosis not present

## 2019-10-05 DIAGNOSIS — M549 Dorsalgia, unspecified: Secondary | ICD-10-CM

## 2019-10-05 MED ORDER — DIAZEPAM 2 MG PO TABS
2.0000 mg | ORAL_TABLET | Freq: Once | ORAL | Status: AC
  Administered 2019-10-05: 2 mg via ORAL
  Filled 2019-10-05: qty 1

## 2019-10-05 MED ORDER — KETOROLAC TROMETHAMINE 60 MG/2ML IM SOLN
15.0000 mg | Freq: Once | INTRAMUSCULAR | Status: AC
  Administered 2019-10-05: 15 mg via INTRAMUSCULAR
  Filled 2019-10-05: qty 2

## 2019-10-05 MED ORDER — DIAZEPAM 5 MG PO TABS
5.0000 mg | ORAL_TABLET | Freq: Four times a day (QID) | ORAL | 0 refills | Status: DC | PRN
Start: 2019-10-05 — End: 2021-03-18

## 2019-10-05 MED ORDER — ACETAMINOPHEN 500 MG PO TABS
1000.0000 mg | ORAL_TABLET | Freq: Once | ORAL | Status: AC
  Administered 2019-10-05: 1000 mg via ORAL
  Filled 2019-10-05: qty 2

## 2019-10-05 MED ORDER — LIDOCAINE-EPINEPHRINE (PF) 2 %-1:200000 IJ SOLN
10.0000 mL | Freq: Once | INTRAMUSCULAR | Status: AC
  Administered 2019-10-05: 10 mL via INTRADERMAL
  Filled 2019-10-05: qty 10

## 2019-10-05 MED ORDER — OXYCODONE HCL 5 MG PO TABS
5.0000 mg | ORAL_TABLET | Freq: Once | ORAL | Status: AC
  Administered 2019-10-05: 5 mg via ORAL
  Filled 2019-10-05: qty 1

## 2019-10-05 NOTE — ED Notes (Signed)
ED Provider at bedside. 

## 2019-10-05 NOTE — ED Triage Notes (Signed)
Pt c/o "severe sharp pain" from between shoulder blade that radiates down R arm since Tuesday. Pt saw a teledoctor and was Rx prednisone without relief.

## 2019-10-05 NOTE — Discharge Instructions (Signed)
Take 4 over the counter ibuprofen tablets 3 times a day or 2 over-the-counter naproxen tablets twice a day for pain. Also take tylenol 1000mg(2 extra strength) four times a day.    

## 2019-10-05 NOTE — ED Provider Notes (Signed)
[facility] EMERGENCY DEPARTMENT Provider Note   CSN: [id number] Arrival date & time: 10/05/19  2048     History Chief Complaint  Patient presents with  . Neck Pain    [name] is a 44 y.o. female.  44 yo F with a chief complaints of right-sided pain.  This starts at the shoulder blade and radiates down the arm to the fingers.  Going on for a couple days.  She had a televisit yesterday and was started on prednisone.  She does not think she is had much improvement and just needs some relief.  The difficulty sleeping.  Denies trauma denies overt neck pain denies headache.  Denies weakness to the hand.  Seems to be a constant pain.  Anything and everything seems to make it worse.  The history is provided by the patient.  Neck Pain Pain location:  R side Quality:  Shooting Pain radiates to:  R arm Pain severity:  Severe Pain is:  Same all the time Onset quality:  Gradual Duration:  2 days Timing:  Constant Progression:  Unchanged Chronicity:  New Relieved by:  Nothing Worsened by:  Nothing Ineffective treatments:  None tried Associated symptoms: no chest pain, no fever and no headaches        Past Medical History:  Diagnosis Date  . Allergic rhinitis   . H/O LEEP    1999 and 2000.  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 History of ectopic pregnancy 2009   left, tube preserved  . Iron deficiency 10/2015   w/out anemia (from pt's menorrhagia). Symptomatic : fatigue and hair loss.  Intol of oral iron.  Hematology saw her 12/25/15 and did feraheme x 2 doses.  Pt's symptoms abated nicely with this. She has avg'd 2 doses of IV iron per year (as of 11/2018).  . Menorrhagia    pelvic ultrasound 02/2018-->possible adenomyosis per GYN.  Ongoing GYN mgmt/considering ablation as of 03/2019.  . Obesity, Class II, BMI 35-39.9 09/29/2007   Qualifier: Diagnosis of  By: Wynona Luna   . Ocular migraine   . TMJ pain  dysfunction syndrome 2015   (causing otalgia/referred pain)-ENT is Dr. Redmond Baseman  . Vitamin D deficiency     Patient Active Problem List   Diagnosis Date Noted  . Need for prophylactic vaccination and inoculation against influenza 01/28/2016  . Menorrhagia with regular cycle 12/31/2015  . Iron deficiency 12/18/2015  . CAP (community acquired pneumonia) 07/20/2013  . History of loop electrical excision procedure (LEEP) 10/16/2012  . Plantar fasciitis of left foot 07/12/2012    Past Surgical History:  Procedure Laterality Date  . CESAREAN SECTION      2007 & 2010  . CHOLECYSTECTOMY     2004 (for dysfunctional GB not for stones)  . COLONOSCOPY     Severe diverticulosis from transverse to sigmoid colon, with increased spasm and tortuosity: likely cause of her urgency.  Recall 5 yrs (Dr. Lyla Son).  . COLPOSCOPY  04/23/2019  . endom polypectomy  05/07/2019  . ENDOMETRIAL ABLATION  05/07/2019  . LAPAROSCOPY FOR ECTOPIC PREGNANCY  07/26/2007   salpingostomy for ectopic  . 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 TYMPANOSTOMY TUBE PLACEMENT  as a child     OB History    Gravida  5   Para  2   Term  2   Preterm  0   AB  3  Living  2     SAB  2   TAB  0   Ectopic  1   Multiple  0   Live Births  2           Family History  Problem Relation Age of Onset  . Bipolar disorder Mother   . Diabetes Mother   . Hypertension Father   . Peripheral vascular disease Maternal Grandmother        died during carotid surgery    Social History   Tobacco Use  . Smoking status: Never Smoker  . Smokeless tobacco: Never Used  Vaping Use  . Vaping Use: Never used  Substance Use Topics  . Alcohol use: No  . Drug use: No    Home Medications Prior to Admission medications   Medication Sig Start Date End Date Taking? Authorizing Provider  Vitamin D, Ergocalciferol, (DRISDOL) 1.25 MG (50000 UT) CAPS capsule TAKE 1 CAPSULE (50,000 UNITS TOTAL) BY MOUTH ONCE A WEEK. FOR 8 WEEKS  THEN TAKE OTC VITAMIN D3 04/16/19  Yes Truitt Merle, MD  diazepam (VALIUM) 5 MG tablet Take 1 tablet (5 mg total) by mouth every 6 (six) hours as needed (spasms). 10/05/19   Deno Etienne, DO    Allergies    Sulfa antibiotics  Review of Systems   Review of Systems  Constitutional: Negative for chills and fever.  HENT: Negative for congestion and rhinorrhea.   Eyes: Negative for redness and visual disturbance.  Respiratory: Negative for shortness of breath and wheezing.   Cardiovascular: Negative for chest pain and palpitations.  Gastrointestinal: Negative for nausea and vomiting.  Genitourinary: Negative for dysuria and urgency.  Musculoskeletal: Positive for neck pain. Negative for arthralgias and myalgias.  Skin: Negative for pallor and wound.  Neurological: Negative for dizziness and headaches.    Physical Exam Updated Vital Signs BP 106/68 (BP Location: Left Arm)   Pulse 75   Temp 98.8 F (37.1 C) (Oral)   Resp 14   Ht 5\' 3"  (1.6 m)   Wt 102.1 kg   SpO2 100%   BMI 39.86 kg/m   Physical Exam Vitals and nursing note reviewed.  Constitutional:      General: She is not in acute distress.    Appearance: She is well-developed. She is not diaphoretic.  HENT:     Head: Normocephalic and atraumatic.     Comments: Able to rotate her head without issue.  Negative Spurling's test.  No pain along the clavicle the [facility] joint or the biceps tendon.  Pulse motor and sensation are intact distally.  Full range of motion at the shoulder.  She has pinpoint and exquisite tenderness to a palpable area to the right mid back just superior and medial to the scapula.  Reproduces the patient's pain. Eyes:     Pupils: Pupils are equal, round, and reactive to light.  Cardiovascular:     Rate and Rhythm: Normal rate and regular rhythm.     Heart sounds: No murmur heard.  No friction rub. No gallop.   Pulmonary:     Effort: Pulmonary effort is normal.     Breath sounds: No wheezing or rales.   Abdominal:     General: There is no distension.     Palpations: Abdomen is soft.     Tenderness: There is no abdominal tenderness.  Musculoskeletal:        General: No tenderness.     Cervical back: Normal range of motion and neck supple.  Skin:  General: Skin is warm and dry.  Neurological:     Mental Status: She is alert and oriented to person, place, and time.  Psychiatric:        Behavior: Behavior normal.     ED Results / Procedures / Treatments   Labs (all labs ordered are listed, but only abnormal results are displayed) Labs Reviewed - No data to display  EKG None  Radiology No results found.  Procedures Procedures (including critical care time) TRIGGER POINT INJECTION PROCEDURE NOTE Procedure authorized and performed by: Deno Etienne, DO  Patient given 2% lidocaine injection in 1 trigger points. Patient consents verbally to this.   Procedure: With chlorhexidine musculature.  Palpable firm area of tenderness consistent with trigger point is localized. It was approached with 25-gauge needle and 10 mL of 2% lidocaine was injected. Tolerated this well with immediate pain improvement.     Medications Ordered in ED Medications  lidocaine-EPINEPHrine (XYLOCAINE W/EPI) 2 %-1:200000 (PF) injection 10 mL (10 mLs Intradermal Given by Other 10/05/19 2157)  ketorolac (TORADOL) injection 15 mg (15 mg Intramuscular Given 10/05/19 2124)  acetaminophen (TYLENOL) tablet 1,000 mg (1,000 mg Oral Given 10/05/19 2123)  oxyCODONE (Oxy IR/ROXICODONE) immediate release tablet 5 mg (5 mg Oral Given 10/05/19 2124)  diazepam (VALIUM) tablet 2 mg (2 mg Oral Given 10/05/19 2123)    ED Course  I have reviewed the triage vital signs and the nursing notes.  Pertinent labs & imaging results that were available during my care of the patient were reviewed by me and considered in my medical decision making (see chart for details).    MDM Rules/Calculators/A&P                          44 yo F  with a chief complaints of radicular pain that starts in the shoulder and radiates down the arm.  She has a pinpoint area of exquisite tenderness.  Most likely the patient has muscular spasm that somehow is irritating the brachial plexus.  She has no weakness no numbness.  Full range of motion of the shoulder.  Nontraumatic.  I doubt imaging will be helpful.  We will perform a trigger point injection.  Treat supportively.  Suspect that her symptoms will improve with time and NSAIDs.  PCP follow-up.  10:04 PM:  I have discussed the diagnosis/risks/treatment options with the patient and believe the pt to be eligible for discharge home to follow-up with PCP. We also discussed returning to the ED immediately if new or worsening sx occur. We discussed the sx which are most concerning (e.g., sudden worsening pain, fever, inability to tolerate by mouth, weakness) that necessitate immediate return. Medications administered to the patient during their visit and any new prescriptions provided to the patient are listed below.  Medications given during this visit Medications  lidocaine-EPINEPHrine (XYLOCAINE W/EPI) 2 %-1:200000 (PF) injection 10 mL (10 mLs Intradermal Given by Other 10/05/19 2157)  ketorolac (TORADOL) injection 15 mg (15 mg Intramuscular Given 10/05/19 2124)  acetaminophen (TYLENOL) tablet 1,000 mg (1,000 mg Oral Given 10/05/19 2123)  oxyCODONE (Oxy IR/ROXICODONE) immediate release tablet 5 mg (5 mg Oral Given 10/05/19 2124)  diazepam (VALIUM) tablet 2 mg (2 mg Oral Given 10/05/19 2123)     The patient appears reasonably screen and/or stabilized for discharge and I doubt any other medical condition or other [facility] requiring further screening, evaluation, or treatment in the ED at this time prior to discharge.   Final Clinical Impression(s) /  ED Diagnoses Final diagnoses:  Trigger point with back pain  Cervical radiculopathy    Rx / DC Orders ED Discharge Orders         Ordered    diazepam  (VALIUM) 5 MG tablet  Every 6 hours PRN     Discontinue  Reprint     10/05/19 2146           Deno Etienne, DO 10/05/19 2206

## 2019-10-08 ENCOUNTER — Telehealth: Payer: Self-pay

## 2019-10-08 NOTE — Telephone Encounter (Signed)
He has no openings for today. She may be able to schedule with [facility] provider

## 2019-10-08 NOTE — Telephone Encounter (Signed)
Patient called in stating that she thinks she may have a slipped disk. states she has had a tele visit and visited the ER and nothing has gave her any release or help. She states she is flat on her back at all time. She's unable to drive or sit up to eat. She was giving a muscle relaxer from ED. And Tele visit gave her a Steroid but then the ED  Told her to stop taking the Steroid. But she called the pharmacy and they told her to start back taking them. So she started a medication that has a course and was unable to start.   Patient will be back in town on Wednesday 10/10/19 wanting to know if she can be worked in on this day     Please call and advise

## 2019-10-10 ENCOUNTER — Other Ambulatory Visit: Payer: Self-pay

## 2019-10-10 DIAGNOSIS — Z8742 Personal history of other diseases of the female genital tract: Secondary | ICD-10-CM | POA: Insufficient documentation

## 2019-10-11 ENCOUNTER — Encounter: Payer: Self-pay | Admitting: Family Medicine

## 2019-10-11 ENCOUNTER — Other Ambulatory Visit: Payer: Self-pay

## 2019-10-11 ENCOUNTER — Ambulatory Visit: Payer: BC Managed Care – PPO | Admitting: Family Medicine

## 2019-10-11 VITALS — BP 114/76 | HR 76 | Temp 97.9°F | Resp 16 | Ht 63.0 in | Wt 222.0 lb

## 2019-10-11 DIAGNOSIS — R29898 Other symptoms and signs involving the musculoskeletal system: Secondary | ICD-10-CM | POA: Diagnosis not present

## 2019-10-11 DIAGNOSIS — M898X1 Other specified disorders of bone, shoulder: Secondary | ICD-10-CM

## 2019-10-11 DIAGNOSIS — M5412 Radiculopathy, cervical region: Secondary | ICD-10-CM | POA: Diagnosis not present

## 2019-10-11 MED ORDER — HYDROCODONE-ACETAMINOPHEN 5-325 MG PO TABS: 1.0000 | ORAL_TABLET | Freq: Four times a day (QID) | ORAL | 0 refills | Status: DC | PRN

## 2019-10-11 NOTE — Progress Notes (Signed)
OFFICE VISIT  10/11/2019   CC:  Chief Complaint  Patient presents with   Possible slipped disc    went to ED on 6/11   HPI:    Patient is a 44 y.o. Caucasian female who presents for "possible slipped disc". Of note, pt went to [facility]/05/[facility]--I reviewed entire record today. Presented for couple day hx of R neck/should pain that radiated down arm.  Exam supportive for trigger point at R scapular region and she was given a trigger point injection. No x-rays in EMR from that date.  No labs.  HPI: Woke up with pain in R scapular area about 8 d/a. She had detailed her car the day prior but o/w no overuse, acute strain, or trauma. Next couple days it was there but mild.  Then it got [facility] more severe. ER and ABduction hurt at that time but this particular pain has now let up some.She got med MD (e-visit) visit about 4 d into this process and was rx'd prednisone.  This note is not available to me/not in EMR. She was not improving so she was directed to the ED (see above, before HPI section).  Now has burning pain in R side of neck, right upper scap region, right upper trap region, and achy pain radiates down R arm to elbow region.  Feels weakness in R hand, esp fingers 4 and 5.  The pain involves under R arm and across R side thoracic level of chest wall (not anterior)--"more of muscle spasm pain in this area".   Describes pain as VERY VERY severe, even to light touch. No tingling or numbness.  No rash.  Still some R shoulder blade pain but not as bad.    No hx of neck or shoulder or arm problem, no x-rays of these areas. Having muscle spasms a lot in R neck/shoulder/scap region/upper arm.  She does not think the prednisone helped anything at all.  Past Medical History:  Diagnosis Date   Allergic rhinitis    H/O LEEP    1999 and 2000.  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 Symptomatic : fatigue and hair loss.  Intol of oral iron.  Hematology saw her 12/25/15 and did feraheme x 2 doses.  Pt's symptoms abated nicely with this. She has avg'd 2 doses of IV iron per year (as of 11/2018).   Menorrhagia    pelvic ultrasound 02/2018-->possible adenomyosis per GYN.  Ongoing GYN mgmt/considering ablation as of 03/2019.   Obesity, Class II, BMI 35-39.9 09/29/2007   Qualifier: Diagnosis of  By: Wynona Luna    Ocular migraine    TMJ pain dysfunction syndrome 2015   (causing otalgia/referred pain)-ENT is Dr. Redmond Baseman   Vitamin D deficiency     Past Surgical History:  Procedure Laterality Date   CESAREAN SECTION      2007 & 2010   CHOLECYSTECTOMY     2004 (for dysfunctional GB not for stones)   COLONOSCOPY     Severe diverticulosis from transverse to sigmoid colon, with increased spasm and tortuosity: likely cause of her urgency.  Recall 5 yrs (Dr. Lyla Son).   COLPOSCOPY  04/23/2019   endom polypectomy  05/07/2019   ENDOMETRIAL ABLATION  05/07/2019   LAPAROSCOPY FOR ECTOPIC PREGNANCY  07/26/2007   salpingostomy for ectopic  LEEP  12/1997 & 04/1998   CIN III   TYMPANOSTOMY TUBE PLACEMENT  as a child    Outpatient Medications Prior to Visit  Medication Sig Dispense Refill   diazepam (VALIUM) 5 MG tablet Take 1 tablet (5 mg total) by mouth every 6 (six) hours as needed (spasms). 10 tablet 0   methylPREDNISolone (MEDROL DOSEPAK) 4 MG TBPK tablet  (Patient not taking: Reported on 10/11/2019)     Vitamin D, Ergocalciferol, (DRISDOL) 1.25 MG (50000 UT) CAPS capsule TAKE 1 CAPSULE (50,000 UNITS TOTAL) BY MOUTH ONCE A WEEK. FOR 8 WEEKS THEN TAKE OTC VITAMIN D3 (Patient not taking: Reported on 10/11/2019) 8 capsule 0   No facility-administered medications prior to visit.    Allergies  Allergen Reactions   Sulfa Antibiotics Hives    ROS As per HPI  PE: Blood pressure 114/76, pulse  76, temperature 97.9 F (36.6 C), temperature source Temporal, resp. rate 16, height 5\' 3"  (1.6 m), weight 222 lb (100.7 kg), SpO2 98 %. Gen: Alert, well appearing.  Patient is oriented to person, place, time, and situation. AFFECT: pleasant, lucid thought and speech. No neck TTP but significant TTP in suprascap soft tissues on R, upper trapezius on R, across entire R shoulder, R upper arm as well.  Neck ROM intact but pain is signif worse with bending neck to the right.  +Spurlings on R. R shoulder ROM intact.  No AC joint TTP on either side.  Upper arms, forearms, and wrist strength 5/5 bilat, but decreased strength (4/5) in 4th and 5th fingers on R hand.  DTRs trace on both sides.   SKIN: no rash. No TTP in R side of thoracic area or in R axillary area.  LABS:    Chemistry      Component Value Date/Time   NA 139 02/13/2018 1144   NA 135 12/18/2015 0934   K 4.2 02/13/2018 1144   K 4.3 12/18/2015 0934   CL 104 02/13/2018 1144   CL 104 12/18/2015 0934   CO2 29 02/13/2018 1144   CO2 28 12/18/2015 0934   BUN 16 02/13/2018 1144   BUN 9 12/18/2015 0934   CREATININE 0.69 02/13/2018 1144   CREATININE 1.1 12/18/2015 0934      Component Value Date/Time   CALCIUM 9.0 02/13/2018 1144   CALCIUM 8.8 12/18/2015 0934   ALKPHOS 54 02/13/2018 1144   ALKPHOS 72 12/18/2015 0934   AST 10 02/13/2018 1144   AST 18 12/18/2015 0934   ALT 9 02/13/2018 1144   ALT 13 12/18/2015 0934   BILITOT 0.4 02/13/2018 1144   BILITOT 0.80 12/18/2015 0934     Lab Results  Component Value Date   WBC 6.7 04/09/2019   HGB 12.7 04/09/2019   HCT 37.8 04/09/2019   MCV 89.4 04/09/2019   PLT 258 04/09/2019   Lab Results  Component Value Date   VITAMINB12 492 12/18/2015   Lab Results  Component Value Date   IRON 44 04/09/2019   TIBC 241 04/09/2019   FERRITIN 116 04/09/2019   IMPRESSION AND PLAN:  Acute R sided neck/scap/trap/shoulder pain with radiculopathy pain to the level of the elbow, + 4th and 5th  digit weakness on R hand.  Severe pain and impairment. Plan: MRI C spine w/out contrast ASAP to eval for acute disc herniation with spinal nerve(s) impingement. Ibup 600mg  bid with food for 7d. vicodin 5/325, 1-2 q6h prn, #30.  Spent 42 min with pt today, with >50% of this time spent in counseling and  care coordination regarding the above problems.  An After Visit Summary was printed and given to the patient.  FOLLOW UP: Return for f/u to be determined based on results of workup.  Signed:  Crissie Sickles, MD           10/11/2019

## 2019-10-15 ENCOUNTER — Ambulatory Visit: Payer: BC Managed Care – PPO | Admitting: Family Medicine

## 2019-10-16 ENCOUNTER — Encounter: Payer: Self-pay | Admitting: Family Medicine

## 2019-10-17 ENCOUNTER — Ambulatory Visit: Payer: BC Managed Care – PPO | Admitting: Family Medicine

## 2019-10-17 MED ORDER — PREDNISONE 10 MG PO TABS: ORAL_TABLET | ORAL | 0 refills | Status: DC

## 2019-10-17 NOTE — Telephone Encounter (Signed)
I can eRx some prednisone, but I ordered a neck MRI when I saw her and it still says "ordered" in status--why has this not been scheduled?? Does she not want to get it done now??  Let me know. I'll send in prednisone.

## 2019-10-17 NOTE — Telephone Encounter (Signed)
Please advise, thanks.

## 2019-10-18 ENCOUNTER — Other Ambulatory Visit: Payer: Self-pay

## 2019-10-18 DIAGNOSIS — M5412 Radiculopathy, cervical region: Secondary | ICD-10-CM

## 2019-10-18 DIAGNOSIS — R29898 Other symptoms and signs involving the musculoskeletal system: Secondary | ICD-10-CM

## 2019-10-25 ENCOUNTER — Telehealth: Payer: Self-pay | Admitting: Hematology

## 2019-10-25 NOTE — Telephone Encounter (Signed)
R/s appt per provider. Changes In template. Pt request to see YF. Pt is aware of appts.

## 2019-11-12 NOTE — Progress Notes (Signed)
[place]   Telephone:(336) [phone] Fax:(336) [phone]   Clinic Follow up Note   Patient Care Team: Tammi Sou, MD as PCP - General (Family Medicine) Harriet Masson, DPM as Consulting Physician (Podiatry) Marin Olp, Rudell Cobb, MD as Consulting Physician (Oncology) Truitt Merle, MD as Consulting Physician (Hematology) Elesa Massed, NP as Nurse Practitioner (Obstetrics and Gynecology) Paula Compton, MD as Consulting Physician (Obstetrics and Gynecology)  Date of Service:  11/16/2019  CHIEF COMPLAINT: Iron Deficiency Anemia  CURRENT THERAPY:  IV Feraheme 510mg as needed, last in 02/15/19  INTERVAL HISTORY:  [name] is here for a follow up of anemia. She was last seen by me 1 year ago. She presents to the clinic alone. She notes she is doing well. She notes she has herniated disc in her neck which has been causing her pain in the past 6 weeks with neuropathy in her arms. She has not noticed her anemia symptoms. She notes she had uterine ablation in 04/2019. Her period is almost gone and much lighter.   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  MEDICAL HISTORY:  Past Medical History:  Diagnosis Date  . Allergic rhinitis   . Cervical spondylosis    Right radiculopathy->MRI confirmed right sided C7 compression 11/13/19  . H/O LEEP     1999 and 2000.  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 History of ectopic pregnancy 2009   left, tube preserved  . Iron deficiency 10/2015   w/out anemia (from pt's menorrhagia). Symptomatic : fatigue and hair loss.  Intol of oral iron.  Hematology saw her 12/25/15 and did feraheme x 2 doses.  Pt's symptoms abated nicely with this. She has avg'd 2 doses of IV iron per year (as of 11/2018).  . Menorrhagia    pelvic ultrasound 02/2018-->possible adenomyosis per GYN.  Ongoing GYN mgmt/considering ablation as of 03/2019.  . Obesity, Class II, BMI 35-39.9 09/29/2007   Qualifier: Diagnosis of  By: Wynona Luna   . Ocular migraine   . TMJ pain dysfunction syndrome 2015   (causing otalgia/referred pain)-ENT is Dr. Redmond Baseman  . Vitamin D deficiency     SURGICAL HISTORY: Past Surgical History:  Procedure Laterality Date  . CESAREAN SECTION      2007 & 2010  . CHOLECYSTECTOMY     2004 (for dysfunctional GB not for stones)  . COLONOSCOPY     Severe diverticulosis from transverse to sigmoid colon, with increased spasm and tortuosity: likely cause of her urgency.  Recall 5 yrs (Dr. Lyla Son).  . COLPOSCOPY  04/23/2019  . endom polypectomy  05/07/2019  . ENDOMETRIAL ABLATION  05/07/2019  . LAPAROSCOPY FOR ECTOPIC PREGNANCY  07/26/2007   salpingostomy for ectopic  . 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 TYMPANOSTOMY TUBE PLACEMENT  as a child    I have reviewed the social history and family history with the patient  and they are unchanged from previous note.  ALLERGIES:  is allergic to sulfa antibiotics.  MEDICATIONS:  Current Outpatient Medications  Medication Sig Dispense Refill  . acetaminophen (TYLENOL) 500 MG tablet Take 500 mg by mouth every 6 (six) hours as needed.    Marland Kitchen ibuprofen (ADVIL) 800 MG tablet Take 800 mg by mouth every 8 (eight) hours as needed.    . diazepam (VALIUM) 5 MG tablet Take 1 tablet (5 mg total) by mouth every 6 (six) hours  as needed (spasms). 10 tablet 0  . HYDROcodone-acetaminophen (NORCO/VICODIN) 5-325 MG tablet Take 1 tablet by mouth every 6 (six) hours as needed for moderate pain. 30 tablet 0  . predniSONE (DELTASONE) 10 MG tablet 4 tabs po qd x 3d, then 3 tabs po qd x 3d, then 2 tabs po qd x 3d, then 1 tab po qd x 3d 30 tablet 0   No current facility-administered medications for this visit.    PHYSICAL EXAMINATION: ECOG PERFORMANCE STATUS: 1 - Symptomatic but completely ambulatory  Vitals:   11/16/19 0948  BP: 114/75  Pulse: 67  Resp: 18  Temp: 99 F (37.2 C)  SpO2: 97%   Filed Weights   11/16/19 0948  Weight: (!) 229 lb 1.6 oz (103.9 kg)    Due to COVID19 we will limit examination to appearance. Patient had no complaints.  GENERAL:alert, no distress and comfortable SKIN: skin color normal, no rashes or significant lesions EYES: normal, Conjunctiva are pink and non-injected, sclera clear  NEURO: alert & oriented x 3 with fluent speech   LABORATORY DATA:  I have reviewed the data as listed CBC Latest Ref Rng & Units 11/16/2019 04/09/2019 11/17/2018  WBC 4.0 - 10.5 K/uL 6.1 6.7 7.0  Hemoglobin 12.0 - 15.0 g/dL 14.1 12.7 13.5  Hematocrit 36 - 46 % 42.7 37.8 40.0  Platelets 150 - 400 K/uL 239 258 222     CMP Latest Ref Rng & Units 02/13/2018 12/18/2015 03/17/2015  Glucose 70 - 99 mg/dL 79 90 83  BUN 6 - 23 mg/dL 16 9 11   Creatinine 0.40 - 1.20 mg/dL 0.69 1.1 0.77  Sodium 135 - 145 mEq/L 139 135 141  Potassium 3.5 - 5.1 mEq/L 4.2 4.3 5.1  Chloride 96 - 112 mEq/L 104 104 107  CO2 19 - 32 mEq/L 29 28 27   Calcium 8.4 - 10.5 mg/dL 9.0 8.8 9.0  Total Protein 6.0 - 8.3 g/dL 6.4 6.6 6.2  Total Bilirubin 0.2 - 1.2 mg/dL 0.4 0.80 0.3  Alkaline Phos 39 - 117 U/L 54 72 66  AST 0 - 37 U/L 10 18 14   ALT 0 - 35 U/L 9 13 8       RADIOGRAPHIC STUDIES: I have personally reviewed the radiological images as listed and agreed with the findings in the report. No results found.   ASSESSMENT & PLAN:   MADGELINE RAYO is a 44 y.o. female with    1. 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 Her B12 level and erythropoietin level was normal, hemoglobin electrophoresis was normal. -Her iron deficiency is secondary to menorrhagia, no clinical suspicions for other bleeding. -Also she is not anemic, she is symptomatic from iron deficiency, including hair loss, palpitation and fatigue. -She cannot tolerate oral iron pills. She previously responded well to IV iron, which she received about twice a year.  -She underwent Uterine ablation in 04/2019. Her periods have significantly slowed down and almost  stopped completely.   -Labs reviewed, Hg normal and Iron panel still pending. I suspect with amenorrhea she will likely not need IV Feraheme.  -Repeat lab in 6 months with phone visit afterward. If her iron levels remain normal and does not require anymore IV Feraheme, she can monitor her labs with her PCP.   2. Vitamin D deficiency -Shewas previouslyon 50,000 IU every weekintermittently due to size.  -I recommend she take 5-10K units if her Vit D level is low, otherwise she can use smaller doses.  -today's level is still pending.   3. Herniated Cervical Disc  -Seen on 11/13/19  -She has been in pain for the past 6 weeks with numbness and tingling of her arms.  -This will be managed by her PCP and specialists.   PLAN -She is clinically doing well  -Lab in 6 months with phone visit afterward.    No problem-specific Assessment & Plan notes found for this encounter.   No orders of the defined types were placed in this encounter.  All questions were answered. The patient knows to call the clinic with any problems, questions or concerns. No barriers to learning was detected. The total time spent in the appointment was 20 minutes.     Truitt Merle, MD 11/16/2019   I, Joslyn Devon, am acting as scribe for Truitt Merle, MD.   I have reviewed the above documentation for accuracy and completeness, and I agree with the above.

## 2019-11-13 ENCOUNTER — Ambulatory Visit
Admission: RE | Admit: 2019-11-13 | Discharge: 2019-11-13 | Disposition: A | Discharge status: Home / Self Care | Payer: BC Managed Care – PPO | Source: Ambulatory Visit | Attending: Family Medicine | Admitting: Family Medicine

## 2019-11-13 ENCOUNTER — Other Ambulatory Visit: Payer: Self-pay

## 2019-11-13 DIAGNOSIS — M5412 Radiculopathy, cervical region: Secondary | ICD-10-CM

## 2019-11-13 DIAGNOSIS — R29898 Other symptoms and signs involving the musculoskeletal system: Secondary | ICD-10-CM

## 2019-11-14 ENCOUNTER — Encounter: Payer: Self-pay | Admitting: Family Medicine

## 2019-11-15 ENCOUNTER — Other Ambulatory Visit: Payer: Self-pay | Admitting: Family Medicine

## 2019-11-15 DIAGNOSIS — M5412 Radiculopathy, cervical region: Secondary | ICD-10-CM

## 2019-11-16 ENCOUNTER — Other Ambulatory Visit: Payer: Self-pay

## 2019-11-16 ENCOUNTER — Inpatient Hospital Stay: Payer: BC Managed Care – PPO | Attending: Hematology

## 2019-11-16 ENCOUNTER — Ambulatory Visit: Payer: BC Managed Care – PPO | Admitting: Hematology

## 2019-11-16 ENCOUNTER — Other Ambulatory Visit: Payer: BC Managed Care – PPO

## 2019-11-16 ENCOUNTER — Encounter: Payer: Self-pay | Admitting: Hematology

## 2019-11-16 ENCOUNTER — Inpatient Hospital Stay: Payer: BC Managed Care – PPO | Admitting: Hematology

## 2019-11-16 VITALS — BP 114/75 | HR 67 | Temp 99.0°F | Resp 18 | Ht 63.0 in | Wt 229.1 lb

## 2019-11-16 DIAGNOSIS — E669 Obesity, unspecified: Secondary | ICD-10-CM | POA: Insufficient documentation

## 2019-11-16 DIAGNOSIS — Z79899 Other long term (current) drug therapy: Secondary | ICD-10-CM | POA: Diagnosis not present

## 2019-11-16 DIAGNOSIS — N920 Excessive and frequent menstruation with regular cycle: Secondary | ICD-10-CM | POA: Diagnosis not present

## 2019-11-16 DIAGNOSIS — E611 Iron deficiency: Secondary | ICD-10-CM | POA: Diagnosis not present

## 2019-11-16 DIAGNOSIS — D509 Iron deficiency anemia, unspecified: Secondary | ICD-10-CM | POA: Diagnosis present

## 2019-11-16 DIAGNOSIS — E559 Vitamin D deficiency, unspecified: Secondary | ICD-10-CM | POA: Diagnosis not present

## 2019-11-16 DIAGNOSIS — M5020 Other cervical disc displacement, unspecified cervical region: Secondary | ICD-10-CM | POA: Diagnosis not present

## 2019-11-16 DIAGNOSIS — G629 Polyneuropathy, unspecified: Secondary | ICD-10-CM | POA: Insufficient documentation

## 2019-11-16 LAB — RETIC PANEL
Immature Retic Fract: 7.5 % (ref 2.3–15.9)
RBC.: 4.81 MIL/uL (ref 3.87–5.11)
Retic Count, Absolute: 71.7 10*3/uL (ref 19.0–186.0)
Retic Ct Pct: 1.5 % (ref 0.4–3.1)
Reticulocyte Hemoglobin: 34.5 pg (ref >27.9)

## 2019-11-16 LAB — CBC WITH DIFFERENTIAL (CANCER CENTER ONLY)
Abs Immature Granulocytes: 0.06 10*3/uL (ref 0.00–0.07)
Basophils Absolute: 0 10*3/uL (ref 0.0–0.1)
Basophils Relative: 1 %
Eosinophils Absolute: 0.1 10*3/uL (ref 0.0–0.5)
Eosinophils Relative: 2 %
HCT: 42.7 % (ref 36.0–46.0)
Hemoglobin: 14.1 g/dL (ref 12.0–15.0)
Immature Granulocytes: 1 %
Lymphocytes Relative: 32 %
Lymphs Abs: 1.9 10*3/uL (ref 0.7–4.0)
MCH: 29.3 pg (ref 26.0–34.0)
MCHC: 33 g/dL (ref 30.0–36.0)
MCV: 88.8 fL (ref 80.0–100.0)
Monocytes Absolute: 0.5 10*3/uL (ref 0.1–1.0)
Monocytes Relative: 7 %
Neutro Abs: 3.5 10*3/uL (ref 1.7–7.7)
Neutrophils Relative %: 57 %
Platelet Count: 239 10*3/uL (ref 150–400)
RBC: 4.81 MIL/uL (ref 3.87–5.11)
RDW: 13 % (ref 11.5–15.5)
WBC Count: 6.1 10*3/uL (ref 4.0–10.5)
nRBC: 0 % (ref 0.0–0.2)

## 2019-11-16 LAB — IRON AND TIBC
Iron: 76 ug/dL (ref 41–142)
Saturation Ratios: 27 % (ref 21–57)
TIBC: 279 ug/dL (ref 236–444)
UIBC: 202 ug/dL (ref 120–384)

## 2019-11-16 LAB — VITAMIN D 25 HYDROXY (VIT D DEFICIENCY, FRACTURES): Vit D, 25-Hydroxy: 22.55 ng/mL — ABNORMAL LOW (ref 30–100)

## 2019-11-16 LAB — FERRITIN: Ferritin: 89 ng/mL (ref 11–307)

## 2019-11-17 ENCOUNTER — Encounter: Payer: Self-pay | Admitting: Hematology

## 2019-11-19 ENCOUNTER — Telehealth: Payer: Self-pay | Admitting: Hematology

## 2019-11-19 NOTE — Telephone Encounter (Signed)
Scheduled per 7/23 los. Pt is aware of appt times and dates. 

## 2020-05-21 ENCOUNTER — Other Ambulatory Visit: Payer: Self-pay

## 2020-05-21 ENCOUNTER — Inpatient Hospital Stay: Payer: BC Managed Care – PPO | Attending: Hematology

## 2020-05-21 DIAGNOSIS — E611 Iron deficiency: Secondary | ICD-10-CM

## 2020-05-21 DIAGNOSIS — D509 Iron deficiency anemia, unspecified: Secondary | ICD-10-CM | POA: Insufficient documentation

## 2020-05-21 LAB — CBC WITH DIFFERENTIAL (CANCER CENTER ONLY)
Abs Immature Granulocytes: 0.04 10*3/uL (ref 0.00–0.07)
Basophils Absolute: 0.1 10*3/uL (ref 0.0–0.1)
Basophils Relative: 1 %
Eosinophils Absolute: 0.1 10*3/uL (ref 0.0–0.5)
Eosinophils Relative: 1 %
HCT: 39.4 % (ref 36.0–46.0)
Hemoglobin: 13.1 g/dL (ref 12.0–15.0)
Immature Granulocytes: 0 %
Lymphocytes Relative: 29 %
Lymphs Abs: 2.6 10*3/uL (ref 0.7–4.0)
MCH: 29.2 pg (ref 26.0–34.0)
MCHC: 33.2 g/dL (ref 30.0–36.0)
MCV: 87.8 fL (ref 80.0–100.0)
Monocytes Absolute: 0.6 10*3/uL (ref 0.1–1.0)
Monocytes Relative: 6 %
Neutro Abs: 5.7 10*3/uL (ref 1.7–7.7)
Neutrophils Relative %: 63 %
Platelet Count: 285 10*3/uL (ref 150–400)
RBC: 4.49 MIL/uL (ref 3.87–5.11)
RDW: 12.6 % (ref 11.5–15.5)
WBC Count: 9.2 10*3/uL (ref 4.0–10.5)
nRBC: 0 % (ref 0.0–0.2)

## 2020-05-21 LAB — RETICULOCYTES
Immature Retic Fract: 10 % (ref 2.3–15.9)
RBC.: 4.49 MIL/uL (ref 3.87–5.11)
Retic Count, Absolute: 62.9 10*3/uL (ref 19.0–186.0)
Retic Ct Pct: 1.4 % (ref 0.4–3.1)

## 2020-05-21 NOTE — Progress Notes (Incomplete)
[name]   Telephone:(336) [phone] Fax:(336) [phone]   Clinic Follow up Note   Patient Care Team: Tammi Sou, MD as PCP - General (Family Medicine) Harriet Masson, DPM as Consulting Physician (Podiatry) Marin Olp, Rudell Cobb, MD as Consulting Physician (Oncology) Truitt Merle, MD as Consulting Physician (Hematology) Elesa Massed, NP (Inactive) as Nurse Practitioner (Obstetrics and Gynecology) Paula Compton, MD as Consulting Physician (Obstetrics and Gynecology)  Date of Service:  05/21/2020  CHIEF COMPLAINT: Iron Deficiency Anemia   CURRENT THERAPY:  IV Feraheme 510mg as needed, last in 02/15/19  INTERVAL HISTORY: *** [name] is here for a follow up of anemia. She was last seen by me 6 months ago. She presents to the clinic alone.    REVIEW OF SYSTEMS:  *** Constitutional: Denies fevers, chills or abnormal weight loss Eyes: Denies blurriness of vision Ears, nose, mouth, throat, and face: Denies mucositis or sore throat Respiratory: Denies cough, dyspnea or wheezes Cardiovascular: Denies palpitation, chest discomfort or lower extremity swelling Gastrointestinal:  Denies nausea, heartburn or change in bowel habits Skin: Denies abnormal skin rashes Lymphatics: Denies new lymphadenopathy or easy bruising Neurological:Denies numbness, tingling or new weaknesses Behavioral/Psych: Mood is stable, no new changes  All other systems were reviewed with the patient and are negative.  MEDICAL HISTORY:  Past Medical History:  Diagnosis Date  . Allergic rhinitis   . Cervical spondylosis    Right radiculopathy->MRI confirmed right sided C7 compression 11/13/19  . H/O LEEP    1999 and 2000.  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 History of ectopic pregnancy 2009   left, tube preserved  . Iron deficiency 10/2015   w/out anemia (from pt's menorrhagia). Symptomatic : fatigue and hair loss.  Intol of oral iron.   Hematology saw her 12/25/15 and did feraheme x 2 doses.  Pt's symptoms abated nicely with this. She has avg'd 2 doses of IV iron per year (as of 11/2018).  . Menorrhagia    pelvic ultrasound 02/2018-->possible adenomyosis per GYN.  Ongoing GYN mgmt/considering ablation as of 03/2019.  . Obesity, Class II, BMI 35-39.9 09/29/2007   Qualifier: Diagnosis of  By: Wynona Luna   . Ocular migraine   . TMJ pain dysfunction syndrome 2015   (causing otalgia/referred pain)-ENT is Dr. Redmond Baseman  . Vitamin D deficiency     SURGICAL HISTORY: Past Surgical History:  Procedure Laterality Date  . CESAREAN SECTION      2007 & 2010  . CHOLECYSTECTOMY     2004 (for dysfunctional GB not for stones)  . COLONOSCOPY     Severe diverticulosis from transverse to sigmoid colon, with increased spasm and tortuosity: likely cause of her urgency.  Recall 5 yrs (Dr. Lyla Son).  . COLPOSCOPY  04/23/2019  . endom polypectomy  05/07/2019  . ENDOMETRIAL ABLATION  05/07/2019  . LAPAROSCOPY FOR ECTOPIC PREGNANCY  07/26/2007   salpingostomy for ectopic  . 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 TYMPANOSTOMY TUBE PLACEMENT  as a child    I have reviewed the social history and family history with the patient and they are unchanged from previous note.  ALLERGIES:  is allergic to sulfa antibiotics.  MEDICATIONS:  Current Outpatient Medications  Medication Sig Dispense Refill  . acetaminophen (TYLENOL) 500 MG tablet Take 500 mg by mouth every 6 (six) hours as needed.    . diazepam (VALIUM) 5 MG tablet Take 1 tablet (5 mg  total) by mouth every 6 (six) hours as needed (spasms). 10 tablet 0  . HYDROcodone-acetaminophen (NORCO/VICODIN) 5-325 MG tablet Take 1 tablet by mouth every 6 (six) hours as needed for moderate pain. 30 tablet 0  . ibuprofen (ADVIL) 800 MG tablet Take 800 mg by mouth every 8 (eight) hours as needed.    . predniSONE (DELTASONE) 10 MG tablet 4 tabs po qd x 3d, then 3 tabs po qd x 3d, then 2 tabs po qd x 3d,  then 1 tab po qd x 3d 30 tablet 0   No current facility-administered medications for this visit.    PHYSICAL EXAMINATION: ECOG PERFORMANCE STATUS: {CHL ONC ECOG PS:[id number]}  There were no vitals filed for this visit. There were no vitals filed for this visit. *** GENERAL:alert, no distress and comfortable SKIN: skin color, texture, turgor are normal, no rashes or significant lesions EYES: normal, Conjunctiva are pink and non-injected, sclera clear {OROPHARYNX:no exudate, no erythema and lips, buccal mucosa, and tongue normal}  NECK: supple, thyroid normal size, non-tender, without nodularity LYMPH:  no palpable lymphadenopathy in the cervical, axillary {or inguinal} LUNGS: clear to auscultation and percussion with normal breathing effort HEART: regular rate & rhythm and no murmurs and no lower extremity edema ABDOMEN:abdomen soft, non-tender and normal bowel sounds Musculoskeletal:no cyanosis of digits and no clubbing  NEURO: alert & oriented x 3 with fluent speech, no focal motor/sensory deficits  LABORATORY DATA:  I have reviewed the data as listed CBC Latest Ref Rng & Units 05/21/2020 11/16/2019 04/09/2019  WBC 4.0 - 10.5 K/uL 9.2 6.1 6.7  Hemoglobin 12.0 - 15.0 g/dL 13.1 14.1 12.7  Hematocrit 36.0 - 46.0 % 39.4 42.7 37.8  Platelets 150 - 400 K/uL 285 239 258     CMP Latest Ref Rng & Units 02/13/2018 12/18/2015 03/17/2015  Glucose 70 - 99 mg/dL 79 90 83  BUN 6 - 23 mg/dL 16 9 11   Creatinine 0.40 - 1.20 mg/dL 0.69 1.1 0.77  Sodium 135 - 145 mEq/L 139 135 141  Potassium 3.5 - 5.1 mEq/L 4.2 4.3 5.1  Chloride 96 - 112 mEq/L 104 104 107  CO2 19 - 32 mEq/L 29 28 27   Calcium 8.4 - 10.5 mg/dL 9.0 8.8 9.0  Total Protein 6.0 - 8.3 g/dL 6.4 6.6 6.2  Total Bilirubin 0.2 - 1.2 mg/dL 0.4 0.80 0.3  Alkaline Phos 39 - 117 U/L 54 72 66  AST 0 - 37 U/L 10 18 14   ALT 0 - 35 U/L 9 13 8       RADIOGRAPHIC STUDIES: I have personally reviewed the radiological images as listed and  agreed with the findings in the report. No results found.   ASSESSMENT & PLAN:  BITA [name] is a 45 y.o. female with   1. 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 Her B12 level and erythropoietin level was normal, hemoglobin electrophoresis was normal. -Her iron deficiency is secondary to menorrhagia, no clinical suspicions for other bleeding. -Also she is not anemic, she is symptomatic from iron deficiency, including hair loss, palpitation and fatigue. -She cannot tolerate oral iron pills. She previously responded well to IV iron, which she received about twice a year. -She underwent Uterine ablation in 04/2019. Her periods have significantly slowed down and almost stopped completely.   -Labs reviewed, Hg normal and Iron panel still pending. I suspect with amenorrhea she will likely not need IV Feraheme.  -Repeat lab in 6  months with phone visit afterward. If her iron levels remain normal and does not require anymore IV Feraheme, she can monitor her labs with her PCP.   2. Vitamin D deficiency -Shewaspreviouslyon 50,000 IU every weekintermittently due to size.  -I recommend she take 5-10K units if her Vit D level is low, otherwise she can use smaller doses.  -today's level is still pending.   3. Herniated Cervical Disc  -Seen on 11/13/19  -She has been in pain for the past 6 weeks with numbness and tingling of her arms.  -This will be managed by her PCP and specialists.   PLAN -She is clinically doing well  -Lab in 6 months with phone visit afterward.   No problem-specific Assessment & Plan notes found for this encounter.   No orders of the defined types were placed in this encounter.  All questions were answered. The patient knows to call the clinic with any problems, questions or concerns. No barriers to learning was detected. The total time spent in the appointment was {CHL ONC  TIME VISIT - [id number]}.     Joslyn Devon 05/21/2020   Oneal Deputy, am acting as scribe for Truitt Merle, MD.   {Add scribe attestation statement}

## 2020-05-22 LAB — IRON AND TIBC
Iron: 47 ug/dL (ref 41–142)
Saturation Ratios: 17 % — ABNORMAL LOW (ref 21–57)
TIBC: 276 ug/dL (ref 236–444)
UIBC: 230 ug/dL (ref 120–384)

## 2020-05-22 LAB — FERRITIN: Ferritin: 109 ng/mL (ref 11–307)

## 2020-05-23 ENCOUNTER — Inpatient Hospital Stay: Payer: BC Managed Care – PPO | Admitting: Hematology

## 2020-05-26 ENCOUNTER — Telehealth: Payer: Self-pay | Admitting: *Deleted

## 2020-05-26 NOTE — Telephone Encounter (Signed)
Called pt & informed of Dr Ernestina Penna message.  Pt expressed understanding.

## 2020-05-26 NOTE — Telephone Encounter (Signed)
-----   Message from Truitt Merle, MD sent at 05/24/2020  2:36 PM EST ----- Please let pt know her lab results, no anemia, iron level good, no need iv iron. She is OK to f/u with PCP and see [place] as needed in future. If she has other questions, please set up a phone visit then.  Thanks  Truitt Merle

## 2020-05-29 ENCOUNTER — Telehealth (INDEPENDENT_AMBULATORY_CARE_PROVIDER_SITE_OTHER): Payer: BC Managed Care – PPO | Admitting: Family Medicine

## 2020-05-29 DIAGNOSIS — R059 Cough, unspecified: Secondary | ICD-10-CM

## 2020-05-29 MED ORDER — ALBUTEROL SULFATE HFA 108 (90 BASE) MCG/ACT IN AERS
2.0000 | INHALATION_SPRAY | Freq: Four times a day (QID) | RESPIRATORY_TRACT | 0 refills | Status: DC | PRN
Start: 2020-05-29 — End: 2021-07-21

## 2020-05-29 MED ORDER — BENZONATATE 100 MG PO CAPS
100.0000 mg | ORAL_CAPSULE | Freq: Three times a day (TID) | ORAL | 0 refills | Status: DC | PRN
Start: 2020-05-29 — End: 2021-03-18

## 2020-05-29 NOTE — Progress Notes (Signed)
Virtual Visit via Video Note  I connected with [name]  on 05/29/20 at 11:20 AM EST by a video enabled telemedicine application and verified that I am speaking with the correct person using two identifiers.  Location patient: home, [place] Location provider:work or home office Persons participating in the virtual visit: patient, provider  I discussed the limitations of evaluation and management by telemedicine and the availability of in person appointments. The patient expressed understanding and agreed to proceed.   HPI:  Acute telemedicine visit for sinus congestion: -Onset: about 3 weeks ago -Symptoms include: nasal congestion, PND, cough, some hoarseness -she did 3 home covid tests negative -mostly better except persistent cough at times if takes a deep breath and at night and occ congestion in upper chest/throat, mucus is clear now -Denies:CP, SOB, wheeze, malaise, fevers, discolored sputum -Pertinent past medical history: reports has required inhaler in the past, but denies hx of asthma -Pertinent medication allergies:sulfa -COVID-19 vaccine status: vaccinated x 2  ROS: See pertinent positives and negatives per HPI.  Past Medical History:  Diagnosis Date  . Allergic rhinitis   . Cervical spondylosis    Right radiculopathy->MRI confirmed right sided C7 compression 11/13/19  . H/O LEEP    1999 and 2000.  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 History of ectopic pregnancy 2009   left, tube preserved  . Iron deficiency 10/2015   w/out anemia (from pt's menorrhagia). Symptomatic : fatigue and hair loss.  Intol of oral iron.  Hematology saw her 12/25/15 and did feraheme x 2 doses.  Pt's symptoms abated nicely with this. She has avg'd 2 doses of IV iron per year (as of 11/2018).  . Menorrhagia    pelvic ultrasound 02/2018-->possible adenomyosis per GYN.  Ongoing GYN mgmt/considering ablation as of 03/2019.  . Obesity, Class II, BMI 35-39.9  09/29/2007   Qualifier: Diagnosis of  By: Wynona Luna   . Ocular migraine   . TMJ pain dysfunction syndrome 2015   (causing otalgia/referred pain)-ENT is Dr. Redmond Baseman  . Vitamin D deficiency     Past Surgical History:  Procedure Laterality Date  . CESAREAN SECTION      2007 & 2010  . CHOLECYSTECTOMY     2004 (for dysfunctional GB not for stones)  . COLONOSCOPY     Severe diverticulosis from transverse to sigmoid colon, with increased spasm and tortuosity: likely cause of her urgency.  Recall 5 yrs (Dr. Lyla Son).  . COLPOSCOPY  04/23/2019  . endom polypectomy  05/07/2019  . ENDOMETRIAL ABLATION  05/07/2019  . LAPAROSCOPY FOR ECTOPIC PREGNANCY  07/26/2007   salpingostomy for ectopic  . 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 TYMPANOSTOMY TUBE PLACEMENT  as a child     Current Outpatient Medications:  .  albuterol (PROAIR HFA) 108 (90 Base) MCG/ACT inhaler, Inhale 2 puffs into the lungs every 6 (six) hours as needed for wheezing or shortness of breath., Disp: 1 each, Rfl: 0 .  benzonatate (TESSALON PERLES) 100 MG capsule, Take 1 capsule (100 mg total) by mouth 3 (three) times daily as needed., Disp: 20 capsule, Rfl: 0 .  acetaminophen (TYLENOL) 500 MG tablet, Take 500 mg by mouth every 6 (six) hours as needed., Disp: , Rfl:  .  diazepam (VALIUM) 5 MG tablet, Take 1 tablet (5 mg total) by mouth every 6 (six) hours as needed (spasms)., Disp: 10 tablet, Rfl: 0 .  HYDROcodone-acetaminophen (NORCO/VICODIN) 5-325 MG  tablet, Take 1 tablet by mouth every 6 (six) hours as needed for moderate pain., Disp: 30 tablet, Rfl: 0 .  ibuprofen (ADVIL) 800 MG tablet, Take 800 mg by mouth every 8 (eight) hours as needed., Disp: , Rfl:  .  predniSONE (DELTASONE) 10 MG tablet, 4 tabs po qd x 3d, then 3 tabs po qd x 3d, then 2 tabs po qd x 3d, then 1 tab po qd x 3d, Disp: 30 tablet, Rfl: 0  EXAM:  VITALS per patient if applicable:  GENERAL: alert, oriented, appears well and in no acute  distress  HEENT: atraumatic, conjunttiva clear, no obvious abnormalities on inspection of external nose and ears  NECK: normal movements of the head and neck  LUNGS: on inspection no signs of respiratory distress, breathing rate appears normal, no obvious gross SOB, gasping or wheezing  CV: no obvious cyanosis  MS: moves all visible extremities without noticeable abnormality  PSYCH/NEURO: pleasant and cooperative, no obvious depression or anxiety, speech and thought processing grossly intact  ASSESSMENT AND PLAN:  Discussed the following assessment and plan:  Cough  -we discussed possible serious and likely etiologies, options for evaluation and workup, limitations of telemedicine visit vs in person visit, treatment, treatment risks and precautions. Pt prefers to treat via telemedicine empirically rather than in person at this moment.  Suspect post viral cough/bronchitis versus other.  Opted for trial of albuterol inhaler if needed and Tessalon for cough.  Other symptomatic care measures summarized in patient instructions.  Given duration of symptoms, did advise prompt follow-up if worsening or these measures are not working to resolve symptoms.   Work/School slipped offered: Not applicable Scheduled follow up with PCP offered: She agrees to call PCP office for follow-up if needed. Advised to seek prompt in person care if worsening, new symptoms arise, or if is not improving with treatment. Discussed options for inperson care if PCP office not available. Did let this patient know that I only do telemedicine on Tuesdays and Thursdays for [place]. Advised to schedule follow up visit with PCP or [facility] if any further questions or concerns to avoid delays in care.   I discussed the assessment and treatment plan with the patient. The patient was provided an opportunity to ask questions and all were answered. The patient agreed with the plan and demonstrated an understanding of the instructions.      Lucretia Kern, DO

## 2020-05-29 NOTE — Patient Instructions (Signed)
-  I sent the medication(s) we discussed to your pharmacy: Meds ordered this encounter  Medications  . benzonatate (TESSALON PERLES) 100 MG capsule    Sig: Take 1 capsule (100 mg total) by mouth 3 (three) times daily as needed.    Dispense:  20 capsule    Refill:  0  . albuterol (PROAIR HFA) 108 (90 Base) MCG/ACT inhaler    Sig: Inhale 2 puffs into the lungs every 6 (six) hours as needed for wheezing, excessive cough, chest congestion or shortness of breath.    Dispense:  1 each    Refill:  0     I hope you are feeling better soon!  Seek in person care promptly if your symptoms worsen, new concerns arise or you are not improving with treatment.  It was nice to meet you today. I help [facility] out with telemedicine visits on Tuesdays and Thursdays and am available for visits on those days. If you have any concerns or questions following this visit please schedule a follow up visit with your Primary Care doctor or seek care at a local urgent care clinic to avoid delays in care.

## 2021-03-18 ENCOUNTER — Other Ambulatory Visit: Payer: Self-pay

## 2021-03-18 ENCOUNTER — Encounter (HOSPITAL_BASED_OUTPATIENT_CLINIC_OR_DEPARTMENT_OTHER): Payer: Self-pay | Admitting: Nurse Practitioner

## 2021-03-18 ENCOUNTER — Ambulatory Visit (HOSPITAL_BASED_OUTPATIENT_CLINIC_OR_DEPARTMENT_OTHER): Payer: BC Managed Care – PPO | Admitting: Nurse Practitioner

## 2021-03-18 VITALS — BP 122/82 | HR 68 | Ht 62.0 in | Wt 231.8 lb

## 2021-03-18 DIAGNOSIS — E559 Vitamin D deficiency, unspecified: Secondary | ICD-10-CM

## 2021-03-18 DIAGNOSIS — Z130 Encounter for screening for diseases of the blood and blood-forming organs and certain disorders involving the immune mechanism: Secondary | ICD-10-CM

## 2021-03-18 DIAGNOSIS — Z13220 Encounter for screening for lipoid disorders: Secondary | ICD-10-CM

## 2021-03-18 DIAGNOSIS — Z1329 Encounter for screening for other suspected endocrine disorder: Secondary | ICD-10-CM | POA: Diagnosis not present

## 2021-03-18 DIAGNOSIS — Z0000 Encounter for general adult medical examination without abnormal findings: Secondary | ICD-10-CM

## 2021-03-18 DIAGNOSIS — Z13228 Encounter for screening for other metabolic disorders: Secondary | ICD-10-CM

## 2021-03-18 DIAGNOSIS — Z1211 Encounter for screening for malignant neoplasm of colon: Secondary | ICD-10-CM

## 2021-03-18 DIAGNOSIS — Z1321 Encounter for screening for nutritional disorder: Secondary | ICD-10-CM

## 2021-03-18 NOTE — Progress Notes (Signed)
Orma Render, DNP, AGNP-c [facility] [address]  [place], [place] [address] [phone]  New patient visit   Patient: [name]   DOB: [birth year]   45 y.o. Female  MRN: [id number] Visit Date: 03/18/2021  Patient Care Team: Trevian Hayashida, Coralee Pesa, NP as PCP - General (Nurse Practitioner) Harriet Masson, DPM as Consulting Physician (Podiatry) Marin Olp Rudell Cobb, MD as Consulting Physician (Oncology) Truitt Merle, MD as Consulting Physician (Hematology) Paula Compton, MD as Consulting Physician (Obstetrics and Gynecology)  Today's healthcare provider: Orma Render, NP   Chief Complaint  Patient presents with   Establish Care    Here to establish care. Lots of left hip pain x 8 months. No refills needed today   Subjective    [name] is a 45 y.o. female who presents today as a new patient to establish care.  HPI  [name] presents today with her daughter.  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  She is divorced, but in a steady relationship with her significant other.  She lives in a home with her daughter, son, and Sena Slate, Dominica Severin.  She endorses that she feels safe at home and at work. She is safe in her current relationships and she has a good support system in place.  She does not drink alcohol, use recreational drugs, or use nicotine products.  She is currently sexually active in a monogamous relationship. She has no menstrual cycles due to ablation.  She endorses a "semi" good diet with packed lunches or school meals. She cooks at home about 50% of the time and the rest is takeout. She reports that she does not routinely eat breakfast.   She reports a recent history of left sided hip pain that extends from the left buttocks down through  the right anterior thigh intermittently. She endorses sitting with her left hip bent underneath her as her primary seated form. She tells me that when she sits in this fashion she will occasionally replicate the pain, which is extinguished with extension of her leg and stretching. She tells me she feels like this worsened when she started walking on the treadmill. She has no weakness, numbness, tingling, swelling, or loss of use. The pain is not continuous.   She endorses concerns for "zero metabolism" and slow digestion. She tells me that she is not able to lose weight. She reports that she often wakes feeling full from her dinnertime meal. She has no symptoms of reflux. She endorses cholecystectomy in the past which initially resulted in chronic diarrhea, but this has now moved to periods of diarrhea and periods of constipation. She endorses more diarrhea with stress. She has never had a colonoscopy, but reports that her mother had several polyps and concerning findings on her colonoscopy and it was recommended that she and her siblings have screening [name].   She has a history of anemia from heavy menses for which she had an ablation. She has not had her iron levels checked since the procedure.  She endorses on and off increased anxiety, which was significant around the time of her divorce and during the height of COVID, but this has improved. She tells me the majority of her anxiety symptoms are now related to concerns of being a  single mother with finances and being able to care for herself and her children as she ages er primary worries. She also reports a long history of procrastination, difficulty concentrating, and difficulty completing tasks. She feels that this is manageable at this time, but endorses that she feels she may have a component of ADD.  Her last pap was November 2021 Her last mammo was November 2021.  She has appointments scheduled next month  Past Medical History:  Diagnosis  Date   Abnormal uterine bleeding due to adenomyosis 03/16/2018   Allergic rhinitis    CAP (community acquired pneumonia) 07/20/2013   Cervical spondylosis    Right radiculopathy->MRI confirmed right sided C7 compression 11/13/19   H/O LEEP    1999 and 2000.  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 Symptomatic : fatigue and hair loss.  Intol of oral iron.  Hematology saw her 12/25/15 and did feraheme x 2 doses.  Pt's symptoms abated nicely with this. She has avg'd 2 doses of IV iron per year (as of 11/2018).   Menorrhagia    pelvic ultrasound 02/2018-->possible adenomyosis per GYN.  Ongoing GYN mgmt/considering ablation as of 03/2019.   Menorrhagia with regular cycle 12/31/2015   Need for prophylactic vaccination and inoculation against influenza 01/28/2016   Obesity, Class II, BMI 35-39.9 09/29/2007   Qualifier: Diagnosis of  By: Wynona Luna    Ocular migraine    Plantar fasciitis of left foot 07/12/2012   TMJ pain dysfunction syndrome 2015   (causing otalgia/referred pain)-ENT is Dr. Redmond Baseman   Vitamin D deficiency    Past Surgical History:  Procedure Laterality Date   CESAREAN SECTION      2007 & 2010   CHOLECYSTECTOMY     2004 (for dysfunctional GB not for stones)   COLONOSCOPY     Severe diverticulosis from transverse to sigmoid colon, with increased spasm and tortuosity: likely cause of her urgency.  Recall 5 yrs (Dr. Lyla Son).   COLPOSCOPY  04/23/2019   endom polypectomy  05/07/2019   ENDOMETRIAL ABLATION  05/07/2019   LAPAROSCOPY FOR ECTOPIC PREGNANCY  07/26/2007   salpingostomy for ectopic   LEEP  12/1997 & 04/1998   CIN III   TYMPANOSTOMY TUBE PLACEMENT  as a child   Family Status  Relation Name Status   Mother  (Not Specified)   Father  (Not Specified)   MGM  (Not Specified)   Family History  Problem  Relation Age of Onset   Bipolar disorder Mother    Diabetes Mother    Hypertension Father    Peripheral vascular disease Maternal Grandmother        died during carotid surgery   Social History   Socioeconomic History   Marital status: Divorced    Spouse name: Not on file   Number of children: 2   Years of education: Not on file   Highest education level: Not on file  Occupational History    Employer: HOMEM[employer]ER  Tobacco Use   Smoking status: Never   Smokeless tobacco: Never  Vaping Use   Vaping Use: Never used  Substance and Sexual Activity   Alcohol use: Not Currently   Drug use: No   Sexual activity: Yes    Partners: Male    Birth control/protection: Other-see comments    Comment: Vasectomy  Other Topics Concern   Not on file  Social History Narrative   Married--separated.   College graduate.   Homemaker.   1 daughter   1 son   No T/A/Ds.               Social Determinants of Health   Financial Resource Strain: Not on file  Food Insecurity: Not on file  Transportation Needs: Not on file  Physical Activity: Not on file  Stress: Not on file  Social Connections: Not on file   Outpatient Medications Prior to Visit  Medication Sig   albuterol (PROAIR HFA) 108 (90 Base) MCG/ACT inhaler Inhale 2 puffs into the lungs every 6 (six) hours as needed for wheezing or shortness of breath.   [DISCONTINUED] acetaminophen (TYLENOL) 500 MG tablet Take 500 mg by mouth every 6 (six) hours as needed.   [DISCONTINUED] benzonatate (TESSALON PERLES) 100 MG capsule Take 1 capsule (100 mg total) by mouth 3 (three) times daily as needed. (Patient not taking: Reported on 03/18/2021)   [DISCONTINUED] diazepam (VALIUM) 5 MG tablet Take 1 tablet (5 mg total) by mouth every 6 (six) hours as needed (spasms).   [DISCONTINUED] HYDROcodone-acetaminophen (NORCO/VICODIN) 5-325 MG tablet Take 1 tablet by mouth every 6 (six) hours as needed for moderate pain.   [DISCONTINUED] ibuprofen (ADVIL)  800 MG tablet Take 800 mg by mouth every 8 (eight) hours as needed.   [DISCONTINUED] predniSONE (DELTASONE) 10 MG tablet 4 tabs po qd x 3d, then 3 tabs po qd x 3d, then 2 tabs po qd x 3d, then 1 tab po qd x 3d   No facility-administered medications prior to visit.   Allergies  Allergen Reactions   Sulfa Antibiotics Hives    Immunization History  Administered Date(s) Administered   Influenza Whole 02/24/2009, 01/29/2010   Influenza,inj,Quad PF,6+ Mos 01/06/2015, 03/08/2017, 03/14/2018, 01/23/2019   Influenza-Unspecified 02/03/2021   PFIZER(Purple Top)SARS-COV-2 Vaccination 06/28/2019, 07/20/2019   PPD Test 01/06/2015   Td 09/29/2007   Tdap 06/27/2015    Health Maintenance  Topic Date Due   Pneumococcal Vaccine 30-11 Years old (1 - PCV) Never done   Hepatitis C Screening  Never done   MAMMOGRAM  06/07/2019   PAP SMEAR-Modifier  03/12/2020   COVID-19 Vaccine (4 - Booster for [place] series) 06/19/2020   TETANUS/TDAP  06/26/2025   INFLUENZA VACCINE  Completed   HIV Screening  Completed   HPV VACCINES  Aged Out    Patient Care Team: Jennelle Pinkstaff, Coralee Pesa, NP as PCP - General (Nurse Practitioner) Harriet Masson, DPM as Consulting Physician (Podiatry) Volanda Napoleon, MD as Consulting Physician (Oncology) Truitt Merle, MD as Consulting Physician (Hematology) Paula Compton, MD as Consulting Physician (Obstetrics and Gynecology)  Review of Systems All review of systems negative except what is listed in the HPI    Objective    BP 122/82   Pulse 68   Ht 5\' 2"  (1.575 m)   Wt 231 lb 12.8 oz (105.1 kg)   SpO2 99%   BMI 42.40 kg/m  Physical Exam Vitals and nursing note reviewed.  Constitutional:      General: She is not in acute distress.    Appearance: Normal appearance.  Eyes:     Extraocular Movements: Extraocular movements intact.     Conjunctiva/sclera: Conjunctivae normal.     Pupils: Pupils are equal, round, and reactive to light.  Neck:     Vascular: No carotid  bruit.  Cardiovascular:     Rate and Rhythm: Normal rate and  regular rhythm.     Pulses: Normal pulses.     Heart sounds: Normal heart sounds. No murmur heard. Pulmonary:     Effort: Pulmonary effort is normal.     Breath sounds: Normal breath sounds. No wheezing.  Abdominal:     General: Bowel sounds are normal.     Palpations: Abdomen is soft.  Musculoskeletal:        General: Normal range of motion.     Cervical back: Normal range of motion.     Right lower leg: No edema.     Left lower leg: No edema.  Skin:    General: Skin is warm and dry.     Capillary Refill: Capillary refill takes less than 2 seconds.  Neurological:     General: No focal deficit present.     Mental Status: She is alert and oriented to person, place, and time.  Psychiatric:        Mood and Affect: Mood normal.        Behavior: Behavior normal.        Thought Content: Thought content normal.        Judgment: Judgment normal.     Depression Screen PHQ 2/9 Scores 03/18/2021  PHQ - 2 Score 0  PHQ- 9 Score 3   No results found for any visits on 03/18/21.  Assessment & Plan      Problem List Items Addressed This Visit     Encounter for medical examination to establish care - Primary    New patient to establish care today.  Will order basic labs today for evaluation and make recommendations based on findings.  Monitor includes iron studies due to history of IDA. Will check thyroid levels due to metabolism concerns.  Colonoscopy referral placed to do family history of abnormal findings in mother and recommendations for Dunia Pringle screenings.  No alarming findings present on exam today.  Follow-up in 1 year for CPE or sooner if needed.       Relevant Orders   CBC with Differential/Platelet   Comprehensive metabolic panel   Lipid panel   TSH   VITAMIN D 25 Hydroxy (Vit-D Deficiency, Fractures)   Hemoglobin A1c   Fe+TIBC+Fer   Screening for colon cancer   Relevant Orders   Ambulatory referral to  Gastroenterology   Other Visit Diagnoses     Screening for deficiency anemia       Relevant Orders   CBC with Differential/Platelet   Fe+TIBC+Fer   Screening for lipid disorders       Relevant Orders   Lipid panel   Screening for endocrine, nutritional, metabolic and immunity disorder       Relevant Orders   Comprehensive metabolic panel   TSH   VITAMIN D 25 Hydroxy (Vit-D Deficiency, Fractures)   Hemoglobin A1c   Vitamin D deficiency       Relevant Orders   VITAMIN D 25 Hydroxy (Vit-D Deficiency, Fractures)        Return in about 1 year (around 03/18/2022) for CPE today- CPE 1 year.    Time: 45 minutes, >50% spent counseling, care coordination, chart review, and documentation.    Bufford Helms, Coralee Pesa, NP, DNP, AGNP-C [facility]

## 2021-03-18 NOTE — Patient Instructions (Signed)
Thank you for choosing [place] at [facility] for your Primary Care needs. I am excited for the opportunity to partner with you to meet your health care goals. It was a pleasure meeting you today!  Recommendations from today's visit: I have sent the referral for colonoscopy and orders for labs today We will contact you if we need to discuss labs, otherwise you will receive a MyChart message I have included information and exercises on piriformis syndrome  Information on diet, exercise, and health maintenance recommendations are listed below. This is information to help you be sure you are on track for optimal health and monitoring.   Please look over this and let us know if you have any questions or if you have completed any of the health maintenance outside of [place] so that we can be sure your records are up to date.  ___________________________________________________________ About Me: I am an Adult-Geriatric Nurse Practitioner with a background in caring for patients for more than 20 years with a strong intensive care background. I provide primary care and sports medicine services to patients age 98 and older within this office. My education had a strong focus on caring for the older adult population, which I am passionate about. I am also the director of the APP Fellowship with [facility].   My desire is to provide you with the best service through preventive medicine and supportive care. I consider you a part of the medical team and value your input. I work diligently to ensure that you are heard and your needs are met in a safe and effective manner. I want you to feel comfortable with me as your provider and want you to know that your health concerns are important to me.  For your information, our office hours are: Monday, Tuesday, and Thursday 8:00 AM - 5:00 PM Wednesday and Friday 8:00 AM - 12:00 PM.   In my time away from the office I am teaching new APP's  within the system and am unavailable, but my partner, Dr. Burnard Bunting is in the office for emergent needs.   If you have questions or concerns, please call our office at [phone] or send [place] a MyChart message and we will respond as quickly as possible.  ____________________________________________________________ MyChart:  For all urgent or time sensitive needs we ask that you please call the office to avoid delays. Our number is [phone]. MyChart is not constantly monitored and due to the large volume of messages a day, replies may take up to 72 business hours.  MyChart Policy: MyChart allows for you to see your visit notes, after visit summary, provider recommendations, lab and tests results, make an appointment, request refills, and contact your provider or the office for non-urgent questions or concerns. Providers are seeing patients during normal business hours and do not have built in time to review MyChart messages.  We ask that you allow a minimum of 3 business days for responses to [employer]. For this reason, please do not send urgent requests through [place]. Please call the office at [phone]. New and ongoing conditions may require a visit. We have virtual and in person visit available for your convenience.  Complex MyChart concerns may require a visit. Your provider may request you schedule a virtual or in person visit to ensure we are providing the best care possible. MyChart messages sent after 11:00 AM on Friday will not be received by the provider until Monday morning.    Lab and Test  Results: You will receive your lab and test results on MyChart as soon as they are completed and results have been sent by the lab or testing facility. Due to this service, you will receive your results BEFORE your provider.  I review lab and tests results each morning prior to seeing patients. Some results require collaboration with other providers to ensure you are receiving the most  appropriate care. For this reason, we ask that you please allow a minimum of 3-5 business days from the time the ALL results have been received for your provider to receive and review lab and test results and contact you about these.  Most lab and test result comments from the provider will be sent through [place]. Your provider may recommend changes to the plan of care, follow-up visits, repeat testing, ask questions, or request an office visit to discuss these results. You may reply directly to this message or call the office at [phone] to provide information for the provider or set up an appointment. In some instances, you will be called with test results and recommendations. Please let us know if this is preferred and we will make note of this in your chart to provide this for you.    If you have not heard a response to your lab or test results in 5 business days from all results returning to [place], please call the office to let us know. We ask that you please avoid calling prior to this time unless there is an emergent concern. Due to high call volumes, this can delay the resulting process.  After Hours: For all non-emergency after hours needs, please call the office at [phone] and select the option to reach the on-call provider service. On-call services are shared between multiple [facility] offices and therefore it will not be possible to speak directly with your provider. On-call providers may provide medical advice and recommendations, but are unable to provide refills for maintenance medications.  For all emergency or urgent medical needs after normal business hours, we recommend that you seek care at the closest Urgent Care or Emergency Department to ensure appropriate treatment in a timely manner.  [facility] [place] has a 24 hour emergency room located on the ground floor for your convenience.   Urgent Concerns During the Business Day Providers are seeing patients  from 8AM to [place] with a busy schedule and are most often not able to respond to non-urgent calls until the end of the day or the next business day. If you should have URGENT concerns during the day, please call and speak to the nurse or schedule a same day appointment so that we can address your concern without delay.   Thank you, again, for choosing me as your health care partner. I appreciate your trust and look forward to learning more about you.   Worthy Keeler, DNP, AGNP-c ___________________________________________________________  Health Maintenance Recommendations Screening Testing Mammogram Every 1 -2 years based on history and risk factors Starting at age 41 Pap Smear Ages 21-39 every 3 years Ages 77-65 every 5 years with HPV testing More frequent testing may be required based on results and history Colon Cancer Screening Every 1-10 years based on test performed, risk factors, and history Starting at age 79 Bone Density Screening Every 2-10 years based on history Starting at age 83 for women Recommendations for men differ based on medication usage, history, and risk factors AAA Screening One time ultrasound Men 61-37 years old who have every smoked Lung  Cancer Screening Low Dose Lung CT every 12 months Age 56-80 years with a 30 pack-year smoking history who still smoke or who have quit within the last 15 years  Screening Labs Routine  Labs: Complete Blood Count (CBC), Complete Metabolic Panel (CMP), Cholesterol (Lipid Panel) Every 6-12 months based on history and medications May be recommended more frequently based on current conditions or previous results Hemoglobin A1c Lab Every 3-12 months based on history and previous results Starting at age 69 or earlier with diagnosis of diabetes, high cholesterol, BMI >26, and/or risk factors Frequent monitoring for patients with diabetes to ensure blood sugar control Thyroid Panel (TSH w/ T3 & T4) Every 6 months based on  history, symptoms, and risk factors May be repeated more often if on medication HIV One time testing for all patients 72 and older May be repeated more frequently for patients with increased risk factors or exposure Hepatitis C One time testing for all patients 87 and older May be repeated more frequently for patients with increased risk factors or exposure Gonorrhea, Chlamydia Every 12 months for all sexually active persons 13-24 years Additional monitoring may be recommended for those who are considered high risk or who have symptoms PSA Men 52-63 years old with risk factors Additional screening may be recommended from age 56-69 based on risk factors, symptoms, and history  Vaccine Recommendations Tetanus Booster All adults every 10 years Flu Vaccine All patients 6 months and older every year COVID Vaccine All patients 12 years and older Initial dosing with booster May recommend additional booster based on age and health history HPV Vaccine 2 doses all patients age 17-26 Dosing may be considered for patients over 26 Shingles Vaccine (Shingrix) 2 doses all adults 47 years and older Pneumonia (Pneumovax 23) All adults 26 years and older May recommend earlier dosing based on health history Pneumonia (Prevnar 59) All adults 11 years and older Dosed 1 year after Pneumovax 23  Additional Screening, Testing, and Vaccinations may be recommended on an individualized basis based on family history, health history, risk factors, and/or exposure.  __________________________________________________________  Diet Recommendations for All Patients  I recommend that all patients maintain a diet low in saturated fats, carbohydrates, and cholesterol. While this can be challenging at first, it is not impossible and small changes can make big differences.  Things to try: Decreasing the amount of soda, sweet tea, and/or juice to one or less per day and replace with water While water is always  the first choice, if you do not like water you may consider adding a water additive without sugar to improve the taste other sugar free drinks Replace potatoes with a brightly colored vegetable at dinner Use healthy oils, such as canola oil or olive oil, instead of butter or hard margarine Limit your bread intake to two pieces or less a day Replace regular pasta with low carb pasta options Bake, broil, or grill foods instead of frying Monitor portion sizes  Eat smaller, more frequent meals throughout the day instead of large meals  An important thing to remember is, if you love foods that are not great for your health, you don't have to give them up completely. Instead, allow these foods to be a reward when you have done well. Allowing yourself to still have special treats every once in a while is a nice way to tell yourself thank you for working hard to keep yourself healthy.   Also remember that every day is a new day. If you have  a bad day and "fall off the wagon", you can still climb right back up and keep moving along on your journey!  We have resources available to help you!  Some websites that may be helpful include: www.http://carter.biz/  Www.VeryWellFit.com _____________________________________________________________  Activity Recommendations for All Patients  I recommend that all adults get at least 20 minutes of moderate physical activity that elevates your heart rate at least 5 days out of the week.  Some examples include: Walking or jogging at a pace that allows you to carry on a conversation Cycling (stationary bike or outdoors) Water aerobics Yoga Weight lifting Dancing If physical limitations prevent you from putting stress on your joints, exercise in a pool or seated in a chair are excellent options.  Do determine your MAXIMUM heart rate for activity: YOUR AGE - 220 = MAX HeartRate   Remember! Do not push yourself too hard.  Start slowly and build up your pace, speed,  weight, time in exercise, etc.  Allow your body to rest between exercise and get good sleep. You will need more water than normal when you are exerting yourself. Do not wait until you are thirsty to drink. Drink with a purpose of getting in at least 8, 8 ounce glasses of water a day plus more depending on how much you exercise and sweat.    If you begin to develop dizziness, chest pain, abdominal pain, jaw pain, shortness of breath, headache, vision changes, lightheadedness, or other concerning symptoms, stop the activity and allow your body to rest. If your symptoms are severe, seek emergency evaluation immediately. If your symptoms are concerning, but not severe, please let us know so that we can recommend further evaluation.

## 2021-03-18 NOTE — Assessment & Plan Note (Signed)
New patient to establish care today.  Will order basic labs today for evaluation and make recommendations based on findings.  Monitor includes iron studies due to history of IDA. Will check thyroid levels due to metabolism concerns.  Colonoscopy referral placed to do family history of abnormal findings in mother and recommendations for [facility] screenings.  No alarming findings present on exam today.  Follow-up in 1 year for CPE or sooner if needed.

## 2021-03-19 LAB — COMPREHENSIVE METABOLIC PANEL
ALT: 12 IU/L (ref 0–32)
AST: 16 IU/L (ref 0–40)
Albumin/Globulin Ratio: 1.9 (ref 1.2–2.2)
Albumin: 4.4 g/dL (ref 3.8–4.8)
Alkaline Phosphatase: 85 IU/L (ref 44–121)
BUN/Creatinine Ratio: 13 (ref 9–23)
BUN: 9 mg/dL (ref 6–24)
Bilirubin Total: 0.4 mg/dL (ref 0.0–1.2)
CO2: 25 mmol/L (ref 20–29)
Calcium: 9.3 mg/dL (ref 8.7–10.2)
Chloride: 103 mmol/L (ref 96–106)
Creatinine, Ser: 0.72 mg/dL (ref 0.57–1.00)
Globulin, Total: 2.3 g/dL (ref 1.5–4.5)
Glucose: 88 mg/dL (ref 70–99)
Potassium: 4.8 mmol/L (ref 3.5–5.2)
Sodium: 138 mmol/L (ref 134–144)
Total Protein: 6.7 g/dL (ref 6.0–8.5)
eGFR: 106 mL/min/{1.73_m2} (ref >59)

## 2021-03-19 LAB — IRON,TIBC AND FERRITIN PANEL
Ferritin: 133 ng/mL (ref 15–150)
Iron Saturation: 42 % (ref 15–55)
Iron: 103 ug/dL (ref 27–159)
Total Iron Binding Capacity: 248 ug/dL — ABNORMAL LOW (ref 250–450)
UIBC: 145 ug/dL (ref 131–425)

## 2021-03-19 LAB — LIPID PANEL
Chol/HDL Ratio: 3.3 ratio (ref 0.0–4.4)
Cholesterol, Total: 206 mg/dL — ABNORMAL HIGH (ref 100–199)
HDL: 63 mg/dL (ref >39)
LDL Chol Calc (NIH): 128 mg/dL — ABNORMAL HIGH (ref 0–99)
Triglycerides: 82 mg/dL (ref 0–149)
VLDL Cholesterol Cal: 15 mg/dL (ref 5–40)

## 2021-03-19 LAB — CBC WITH DIFFERENTIAL/PLATELET
Basophils Absolute: 0.1 10*3/uL (ref 0.0–0.2)
Basos: 1 %
EOS (ABSOLUTE): 0.1 10*3/uL (ref 0.0–0.4)
Eos: 1 %
Hematocrit: 42.6 % (ref 34.0–46.6)
Hemoglobin: 15 g/dL (ref 11.1–15.9)
Immature Grans (Abs): 0 10*3/uL (ref 0.0–0.1)
Immature Granulocytes: 0 %
Lymphocytes Absolute: 2.1 10*3/uL (ref 0.7–3.1)
Lymphs: 32 %
MCH: 30.7 pg (ref 26.6–33.0)
MCHC: 35.2 g/dL (ref 31.5–35.7)
MCV: 87 fL (ref 79–97)
Monocytes Absolute: 0.5 10*3/uL (ref 0.1–0.9)
Monocytes: 7 %
Neutrophils Absolute: 3.8 10*3/uL (ref 1.4–7.0)
Neutrophils: 59 %
Platelets: 281 10*3/uL (ref 150–450)
RBC: 4.89 x10E6/uL (ref 3.77–5.28)
RDW: 12.4 % (ref 11.7–15.4)
WBC: 6.5 10*3/uL (ref 3.4–10.8)

## 2021-03-19 LAB — VITAMIN D 25 HYDROXY (VIT D DEFICIENCY, FRACTURES): Vit D, 25-Hydroxy: 19.5 ng/mL — ABNORMAL LOW (ref 30.0–100.0)

## 2021-03-19 LAB — TSH: TSH: 1 u[IU]/mL (ref 0.450–4.500)

## 2021-03-19 LAB — HEMOGLOBIN A1C
Est. average glucose Bld gHb Est-mCnc: 111 mg/dL
Hgb A1c MFr Bld: 5.5 % (ref 4.8–5.6)

## 2021-03-23 ENCOUNTER — Other Ambulatory Visit (HOSPITAL_BASED_OUTPATIENT_CLINIC_OR_DEPARTMENT_OTHER): Payer: Self-pay | Admitting: Nurse Practitioner

## 2021-03-23 DIAGNOSIS — E559 Vitamin D deficiency, unspecified: Secondary | ICD-10-CM

## 2021-03-23 MED ORDER — VITAMIN D (ERGOCALCIFEROL) 1.25 MG (50000 UNIT) PO CAPS: 50000.0000 [IU] | ORAL_CAPSULE | ORAL | 0 refills | Status: DC

## 2021-03-26 ENCOUNTER — Encounter (HOSPITAL_BASED_OUTPATIENT_CLINIC_OR_DEPARTMENT_OTHER): Payer: Self-pay | Admitting: Nurse Practitioner

## 2021-06-15 ENCOUNTER — Other Ambulatory Visit (HOSPITAL_BASED_OUTPATIENT_CLINIC_OR_DEPARTMENT_OTHER): Payer: Self-pay | Admitting: Nurse Practitioner

## 2021-06-15 DIAGNOSIS — E559 Vitamin D deficiency, unspecified: Secondary | ICD-10-CM

## 2021-07-03 ENCOUNTER — Encounter: Payer: Self-pay | Admitting: *Deleted

## 2021-07-21 ENCOUNTER — Other Ambulatory Visit (INDEPENDENT_AMBULATORY_CARE_PROVIDER_SITE_OTHER): Payer: BC Managed Care – PPO

## 2021-07-21 ENCOUNTER — Ambulatory Visit: Payer: BC Managed Care – PPO | Admitting: Internal Medicine

## 2021-07-21 ENCOUNTER — Encounter: Payer: Self-pay | Admitting: Internal Medicine

## 2021-07-21 VITALS — BP 118/78 | HR 76 | Ht 64.0 in | Wt 231.0 lb

## 2021-07-21 DIAGNOSIS — R194 Change in bowel habit: Secondary | ICD-10-CM

## 2021-07-21 DIAGNOSIS — Z8379 Family history of other diseases of the digestive system: Secondary | ICD-10-CM

## 2021-07-21 DIAGNOSIS — Z832 Family history of diseases of the blood and blood-forming organs and certain disorders involving the immune mechanism: Secondary | ICD-10-CM

## 2021-07-21 DIAGNOSIS — Z8371 Family history of colonic polyps: Secondary | ICD-10-CM

## 2021-07-21 LAB — CBC WITH DIFFERENTIAL/PLATELET
Basophils Absolute: 0.1 10*3/uL (ref 0.0–0.1)
Basophils Relative: 1.1 % (ref 0.0–3.0)
Eosinophils Absolute: 0.1 10*3/uL (ref 0.0–0.7)
Eosinophils Relative: 2.2 % (ref 0.0–5.0)
HCT: 40.7 % (ref 36.0–46.0)
Hemoglobin: 13.7 g/dL (ref 12.0–15.0)
Lymphocytes Relative: 37.6 % (ref 12.0–46.0)
Lymphs Abs: 2 10*3/uL (ref 0.7–4.0)
MCHC: 33.7 g/dL (ref 30.0–36.0)
MCV: 86.6 fl (ref 78.0–100.0)
Monocytes Absolute: 0.5 10*3/uL (ref 0.1–1.0)
Monocytes Relative: 9.1 % (ref 3.0–12.0)
Neutro Abs: 2.7 10*3/uL (ref 1.4–7.7)
Neutrophils Relative %: 50 % (ref 43.0–77.0)
Platelets: 245 10*3/uL (ref 150.0–400.0)
RBC: 4.7 Mil/uL (ref 3.87–5.11)
RDW: 13.3 % (ref 11.5–15.5)
WBC: 5.4 10*3/uL (ref 4.0–10.5)

## 2021-07-21 LAB — VITAMIN B12: Vitamin B-12: 288 pg/mL (ref 211–911)

## 2021-07-21 MED ORDER — SUTAB 1479-225-188 MG PO TABS: ORAL_TABLET | ORAL | 0 refills | Status: DC

## 2021-07-21 MED ORDER — ONDANSETRON 4 MG PO TBDP: 4.0000 mg | ORAL_TABLET | ORAL | 0 refills | Status: DC

## 2021-07-21 NOTE — Patient Instructions (Signed)
You have been scheduled for an endoscopy and colonoscopy. Please follow the written instructions given to you at your visit today. ?Please pick up your prep supplies at the pharmacy within the next 1-3 days. ?If you use inhalers (even only as needed), please bring them with you on the day of your procedure. ? ?Your provider has requested that you go to the basement level for lab work before leaving today. Press "B" on the elevator. The lab is located at the first door on the left as you exit the elevator. ? ?Please purchase the following medications over the counter and take as directed: ?Metamucil 2 tablespoons daily ? ?If you are age 44 or older, your body mass index should be between 23-30. Your Body mass index is 39.65 kg/m?Marland Kitchen If this is out of the aforementioned range listed, please consider follow up with your Primary Care Provider. ? ?If you are age 44 or younger, your body mass index should be between 19-25. Your Body mass index is 39.65 kg/m?Marland Kitchen If this is out of the aformentioned range listed, please consider follow up with your Primary Care Provider.  ? ?________________________________________________________ ? ?The  GI providers would like to encourage you to use [facility] to communicate with providers for non-urgent requests or questions.  Due to long hold times on the telephone, sending your provider a message by [facility] may be a faster and more efficient way to get a response.  Please allow 48 business hours for a response.  Please remember that this is for non-urgent requests.  ?_______________________________________________________ ? ?Due to recent changes in healthcare laws, you may see the results of your imaging and laboratory studies on MyChart before your provider has had a chance to review them.  We understand that in some cases there may be results that are confusing or concerning to you. Not all laboratory results come back in the same time frame and the provider may be waiting for  multiple results in order to interpret others.  Please give [place] 48 hours in order for your provider to thoroughly review all the results before contacting the office for clarification of your results.  ? ?

## 2021-07-22 ENCOUNTER — Encounter: Payer: Self-pay | Admitting: Internal Medicine

## 2021-07-22 LAB — COMPREHENSIVE METABOLIC PANEL
ALT: 10 U/L (ref 0–35)
AST: 14 U/L (ref 0–37)
Albumin: 4.1 g/dL (ref 3.5–5.2)
Alkaline Phosphatase: 72 U/L (ref 39–117)
BUN: 14 mg/dL (ref 6–23)
CO2: 25 mEq/L (ref 19–32)
Calcium: 8.9 mg/dL (ref 8.4–10.5)
Chloride: 105 mEq/L (ref 96–112)
Creatinine, Ser: 0.72 mg/dL (ref 0.40–1.20)
GFR: 102.37 mL/min (ref >60.00)
Glucose, Bld: 103 mg/dL — ABNORMAL HIGH (ref 70–99)
Potassium: 3.8 mEq/L (ref 3.5–5.1)
Sodium: 138 mEq/L (ref 135–145)
Total Bilirubin: 0.2 mg/dL (ref 0.2–1.2)
Total Protein: 6.8 g/dL (ref 6.0–8.3)

## 2021-07-22 NOTE — Progress Notes (Signed)
Patient ID: [name], female   DOB: Jul 29, 1975, 46 y.o.   MRN: [id number] ?HPI: ?[name] is a 46 year old female with a past medical history of dysfunctional uterine bleeding leading to iron deficiency status post uterine ablation, possible IBS, remote GERD with mild (LA grade A esophagitis) who is seen in consult at the request of [name], NP to evaluate family history of colon polyps, family history of celiac disease and pernicious anemia.  She is here alone today. ? ?She has a GI history of prior upper endoscopy, and possible colonoscopy.  Interestingly she does not recall the colonoscopy at all.  We did locate a colonoscopy report performed on 10/18/2002 by Lyla Son.  This was done at [facility].  It showed diverticulosis from transverse to sigmoid colon and mild tortuosity. ?Upper endoscopy which she does recall performed on 05/07/2002 by Verl Blalock showed LA grade a esophagitis. ? ?She reports that she is feeling well.  She does occasionally have altered bowel movements with at times mild constipation and fullness in the left abdomen.  She can go several days without bowel movement but then have days where stools are urgent and multiple times per day.  Not truly diarrhea.  No blood in stool or melena.  She does not have heartburn, dysphagia or odynophagia.  No abdominal pain. ? ?She does not take any medications of vitamin D has been recommended. ? ?Her mother, who is also my patient, has had multiple adenomatous colon polyps.  She also has pernicious anemia.  Her sister has celiac disease.  Another sister has pernicious anemia.  Both of her sisters have had colonoscopy without polyps. ? ?She is divorced but works as a Oncologist.  No tobacco.  No alcohol.  She has 2 children. ? ?Past Medical History:  ?Diagnosis Date  ? Abnormal uterine bleeding due to adenomyosis 03/16/2018  ? Allergic rhinitis   ? CAP (community acquired pneumonia) 07/20/2013  ? Cervical spondylosis   ?  Right radiculopathy->MRI confirmed right sided C7 compression 11/13/19  ? Diverticulosis   ? H/O LEEP   ? 1999 and 2000.  Cerv dysplasia ->colpo "looks like low grade but f/u ECC"--per GYN note 03/2019  ? History of ectopic pregnancy 2009  ? left, tube preserved  ? IBS (irritable bowel syndrome)   ? Iron deficiency 10/2015  ? w/out anemia (from pt's menorrhagia). Symptomatic : fatigue and hair loss.  Intol of oral iron.  Hematology saw her 12/25/15 and did feraheme x 2 doses.  Pt's symptoms abated nicely with this. She has avg'd 2 doses of IV iron per year (as of 11/2018).  ? Menorrhagia   ? pelvic ultrasound 02/2018-->possible adenomyosis per GYN.  Ongoing GYN mgmt/considering ablation as of 03/2019.  ? Menorrhagia with regular cycle 12/31/2015  ? Need for prophylactic vaccination and inoculation against influenza 01/28/2016  ? Obesity, Class II, BMI 35-39.9 09/29/2007  ? Qualifier: Diagnosis of  By: Wynona Luna   ? Ocular migraine   ? Plantar fasciitis of left foot 07/12/2012  ? TMJ pain dysfunction syndrome 2015  ? (causing otalgia/referred pain)-ENT is Dr. Redmond Baseman  ? Vitamin D deficiency   ? ? ?Past Surgical History:  ?Procedure Laterality Date  ? CESAREAN SECTION    ?  2007 & 2010  ? CHOLECYSTECTOMY    ? 2004 (for dysfunctional GB not for stones)  ? COLONOSCOPY    ? Severe diverticulosis from transverse to sigmoid colon, with increased spasm and tortuosity: likely cause of her  urgency.  Recall 5 yrs (Dr. Lyla Son).  ? COLPOSCOPY  04/23/2019  ? endom polypectomy  05/07/2019  ? ENDOMETRIAL ABLATION  05/07/2019  ? LAPAROSCOPY FOR ECTOPIC PREGNANCY  07/26/2007  ? salpingostomy for ectopic  ? LEEP  12/1997 & 04/1998  ? CIN III  ? TYMPANOSTOMY TUBE PLACEMENT  as a child  ? ? ?Outpatient Medications Prior to Visit  ?Medication Sig Dispense Refill  ? Vitamin D, Ergocalciferol, (DRISDOL) 1.25 MG (50000 UNIT) CAPS capsule TAKE 1 CAPSULE (50,000 UNITS TOTAL) BY MOUTH EVERY 7 (SEVEN) DAYS. TAKE FOR 12 TOTAL DOSES(WEEKS)  (Patient not taking: Reported on 07/21/2021) 12 capsule 0  ? albuterol (PROAIR HFA) 108 (90 Base) MCG/ACT inhaler Inhale 2 puffs into the lungs every 6 (six) hours as needed for wheezing or shortness of breath. 1 each 0  ? ?No facility-administered medications prior to visit.  ? ? ?Allergies  ?Allergen Reactions  ? Sulfa Antibiotics Hives  ? ? ?Family History  ?Problem Relation Age of Onset  ? Bipolar disorder Mother   ? Diabetes Mother   ? Colon polyps Mother   ? Irritable bowel syndrome Mother   ? Hypertension Father   ? Colon polyps Father   ? Celiac disease Sister   ? Pernicious anemia Sister   ? Peripheral vascular disease Maternal Grandmother   ?     died during carotid surgery  ? Colon cancer Neg Hx   ? Esophageal cancer Neg Hx   ? ? ?Social History  ? ?Tobacco Use  ? Smoking status: Never  ? Smokeless tobacco: Never  ?Vaping Use  ? Vaping Use: Never used  ?Substance Use Topics  ? Alcohol use: Not Currently  ? Drug use: No  ? ? ?ROS: ?As per history of present illness, otherwise negative ? ?BP 118/78   Pulse 76   Ht _0  (1.626 m)   Wt 231 lb (104.8 kg)   BMI 39.65 kg/m?  ?Gen: awake, alert, NAD ?HEENT: anicteric  ?CV: RRR, no mrg ?Pulm: CTA b/l ?Abd: soft, NT/ND, +BS throughout ?Ext: no c/c/e ?Neuro: nonfocal ? ? ?RELEVANT LABS AND IMAGING: ?CBC ?   ?Component Value Date/Time  ? WBC 5.4 07/21/2021 1457  ? RBC 4.70 07/21/2021 1457  ? HGB 13.7 07/21/2021 1457  ? HGB 15.0 03/18/2021 1032  ? HGB 13.5 03/02/2017 1536  ? HCT 40.7 07/21/2021 1457  ? HCT 42.6 03/18/2021 1032  ? HCT 39.6 03/02/2017 1536  ? PLT 245.0 07/21/2021 1457  ? PLT 281 03/18/2021 1032  ? MCV 86.6 07/21/2021 1457  ? MCV 87 03/18/2021 1032  ? MCV 89.4 03/02/2017 1536  ? MCH 30.7 03/18/2021 1032  ? MCH 29.2 05/21/2020 1609  ? MCHC 33.7 07/21/2021 1457  ? RDW 13.3 07/21/2021 1457  ? RDW 12.4 03/18/2021 1032  ? RDW 13.4 03/02/2017 1536  ? LYMPHSABS 2.0 07/21/2021 1457  ? LYMPHSABS 2.1 03/18/2021 1032  ? LYMPHSABS 2.5 03/02/2017 1536  ? MONOABS  0.5 07/21/2021 1457  ? MONOABS 0.4 03/02/2017 1536  ? EOSABS 0.1 07/21/2021 1457  ? EOSABS 0.1 03/18/2021 1032  ? EOSABS 0.2 04/29/2016 1309  ? BASOSABS 0.1 07/21/2021 1457  ? BASOSABS 0.1 03/18/2021 1032  ? BASOSABS 0.0 03/02/2017 1536  ? ? ?CMP  ?   ?Component Value Date/Time  ? NA 138 07/21/2021 1457  ? NA 138 03/18/2021 1032  ? NA 135 12/18/2015 0934  ? K 3.8 07/21/2021 1457  ? K 4.3 12/18/2015 0934  ? CL 105 07/21/2021 1457  ?  CL 104 12/18/2015 0934  ? CO2 25 07/21/2021 1457  ? CO2 28 12/18/2015 0934  ? GLUCOSE 103 (H) 07/21/2021 1457  ? GLUCOSE 90 12/18/2015 0934  ? BUN 14 07/21/2021 1457  ? BUN 9 03/18/2021 1032  ? BUN 9 12/18/2015 0934  ? CREATININE 0.72 07/21/2021 1457  ? CREATININE 1.1 12/18/2015 0934  ? CALCIUM 8.9 07/21/2021 1457  ? CALCIUM 8.8 12/18/2015 0934  ? PROT 6.8 07/21/2021 1457  ? PROT 6.7 03/18/2021 1032  ? PROT 6.6 12/18/2015 0934  ? ALBUMIN 4.1 07/21/2021 1457  ? ALBUMIN 4.4 03/18/2021 1032  ? AST 14 07/21/2021 1457  ? AST 18 12/18/2015 0934  ? ALT 10 07/21/2021 1457  ? ALT 13 12/18/2015 0934  ? ALKPHOS 72 07/21/2021 1457  ? ALKPHOS 72 12/18/2015 0934  ? BILITOT 0.2 07/21/2021 1457  ? BILITOT 0.4 03/18/2021 1032  ? BILITOT 0.80 12/18/2015 0934  ? GFRNONAA 123.54 08/01/2009 1056  ? GFRAA  07/23/2009 1320  ?  >60        ?The eGFR has been calculated ?using the MDRD equation. ?This calculation has not been ?validated in all clinical ?situations. ?eGFR's persistently ?<60 mL/min signify ?possible Chronic Kidney Disease.  ? ? ?ASSESSMENT/PLAN: ?46 year old female with a past medical history of dysfunctional uterine bleeding leading to iron deficiency status post uterine ablation, possible IBS, remote GERD with mild (LA grade A esophagitis) who is seen in consult at the request of [name], NP to evaluate family history of colon polyps, family history of celiac disease and pernicious anemia.  ? ?Family history of colon polyps --her mother has had multiple adenomatous colon polyps.  I do  recommend elevated risk colorectal cancer screening.  She is currently age 61 and routine risk would start at 20.  We discussed the risk, benefits and alternatives and she is agreeable and wishes to proceed. ?--Colon

## 2021-07-24 LAB — IGA: Immunoglobulin A: 154 mg/dL (ref 47–310)

## 2021-07-24 LAB — TISSUE TRANSGLUTAMINASE ABS,IGG,IGA
(tTG) Ab, IgA: <1 U/mL
(tTG) Ab, IgG: <1 U/mL

## 2021-07-24 LAB — INTRINSIC FACTOR ANTIBODIES: Intrinsic Factor: NEGATIVE

## 2021-07-27 NOTE — Progress Notes (Signed)
EGD canceled

## 2021-08-20 ENCOUNTER — Encounter: Payer: Self-pay | Admitting: Family

## 2021-08-23 ENCOUNTER — Encounter: Payer: Self-pay | Admitting: Certified Registered Nurse Anesthetist

## 2021-08-24 ENCOUNTER — Encounter: Payer: Self-pay | Admitting: Internal Medicine

## 2021-08-31 ENCOUNTER — Ambulatory Visit (AMBULATORY_SURGERY_CENTER): Payer: BC Managed Care – PPO | Admitting: Internal Medicine

## 2021-08-31 ENCOUNTER — Encounter: Payer: Self-pay | Admitting: Internal Medicine

## 2021-08-31 VITALS — BP 108/62 | HR 67 | Temp 97.8°F | Resp 12 | Ht 64.0 in | Wt 231.0 lb

## 2021-08-31 DIAGNOSIS — D125 Benign neoplasm of sigmoid colon: Secondary | ICD-10-CM

## 2021-08-31 DIAGNOSIS — Z8371 Family history of colonic polyps: Secondary | ICD-10-CM

## 2021-08-31 MED ORDER — SODIUM CHLORIDE 0.9 % IV SOLN: 500.0000 mL | Freq: Once | INTRAVENOUS | Status: DC

## 2021-08-31 NOTE — Progress Notes (Signed)
Report given to PACU, vss 

## 2021-08-31 NOTE — Progress Notes (Signed)
Patient ID: [name], female   DOB: 1976/01/22, 46 y.o.   MRN: [id number] ?  ? ?GASTROENTEROLOGY PROCEDURE H&P NOTE  ? ?Primary Care Physician: ?Orma Render, NP ? ? ? ?Reason for Procedure:  Family history of multiple adenomatous colon polyps in patient's mother ? ?Plan:    Screening colonoscopy ? ?Patient is appropriate for endoscopic procedure(s) in the ambulatory ([place]) setting. ? ?The nature of the procedure, as well as the risks, benefits, and alternatives were carefully and thoroughly reviewed with the patient. Ample time for discussion and questions allowed. The patient understood, was satisfied, and agreed to proceed.  ? ? ? ?HPI: ?[name] is a 46 y.o. female who presents for screening colonoscopy.  Medical history as below.  Tolerated the prep.  No recent chest pain or shortness of breath.  No abdominal pain today. ? ?Past Medical History:  ?Diagnosis Date  ? Abnormal uterine bleeding due to adenomyosis 03/16/2018  ? Allergic rhinitis   ? CAP (community acquired pneumonia) 07/20/2013  ? Cervical spondylosis   ? Right radiculopathy->MRI confirmed right sided C7 compression 11/13/19  ? Diverticulosis   ? H/O LEEP   ? 1999 and 2000.  Cerv dysplasia ->colpo "looks like low grade but f/u ECC"--per GYN note 03/2019  ? History of ectopic pregnancy 2009  ? left, tube preserved  ? IBS (irritable bowel syndrome)   ? Iron deficiency 10/2015  ? w/out anemia (from pt's menorrhagia). Symptomatic : fatigue and hair loss.  Intol of oral iron.  Hematology saw her 12/25/15 and did feraheme x 2 doses.  Pt's symptoms abated nicely with this. She has avg'd 2 doses of IV iron per year (as of 11/2018).  ? Menorrhagia   ? pelvic ultrasound 02/2018-->possible adenomyosis per GYN.  Ongoing GYN mgmt/considering ablation as of 03/2019.  ? Menorrhagia with regular cycle 12/31/2015  ? Need for prophylactic vaccination and inoculation against influenza 01/28/2016  ? Obesity, Class II, BMI 35-39.9 09/29/2007  ? Qualifier:  Diagnosis of  By: Wynona Luna   ? Ocular migraine   ? Plantar fasciitis of left foot 07/12/2012  ? TMJ pain dysfunction syndrome 2015  ? (causing otalgia/referred pain)-ENT is Dr. Redmond Baseman  ? Vitamin D deficiency   ? ? ?Past Surgical History:  ?Procedure Laterality Date  ? CESAREAN SECTION    ?  2007 & 2010  ? CHOLECYSTECTOMY    ? 2004 (for dysfunctional GB not for stones)  ? COLONOSCOPY    ? Severe diverticulosis from transverse to sigmoid colon, with increased spasm and tortuosity: likely cause of her urgency.  Recall 5 yrs (Dr. Lyla Son).  ? COLPOSCOPY  04/23/2019  ? endom polypectomy  05/07/2019  ? ENDOMETRIAL ABLATION  05/07/2019  ? LAPAROSCOPY FOR ECTOPIC PREGNANCY  07/26/2007  ? salpingostomy for ectopic  ? LEEP  12/1997 & 04/1998  ? CIN III  ? TYMPANOSTOMY TUBE PLACEMENT  as a child  ? ? ?Prior to Admission medications   ?Medication Sig Start Date End Date Taking? Authorizing Provider  ?vitamin B-12 (CYANOCOBALAMIN) 1000 MCG tablet Take 1,000 mcg by mouth daily.   Yes [provider]  ?Vitamin D, Ergocalciferol, (DRISDOL) 1.25 MG (50000 UNIT) CAPS capsule TAKE 1 CAPSULE (50,000 UNITS TOTAL) BY MOUTH EVERY 7 (SEVEN) DAYS. TAKE FOR 12 TOTAL DOSES(WEEKS) 06/15/21  Yes Early, Coralee Pesa, NP  ? ? ?Current Outpatient Medications  ?Medication Sig Dispense Refill  ? vitamin B-12 (CYANOCOBALAMIN) 1000 MCG tablet Take 1,000 mcg by mouth daily.    ?  Vitamin D, Ergocalciferol, (DRISDOL) 1.25 MG (50000 UNIT) CAPS capsule TAKE 1 CAPSULE (50,000 UNITS TOTAL) BY MOUTH EVERY 7 (SEVEN) DAYS. TAKE FOR 12 TOTAL DOSES(WEEKS) 12 capsule 0  ? ?Current Facility-Administered Medications  ?Medication Dose Route Frequency Provider Last Rate Last Admin  ? 0.9 %  sodium chloride infusion  500 mL Intravenous Once Jahniyah Revere, Lajuan Lines, MD      ? ? ?Allergies as of 08/31/2021 - Review Complete 08/31/2021  ?Allergen Reaction Noted  ? Sulfa antibiotics Hives 11/17/2015  ? ? ?Family History  ?Problem Relation Age of Onset  ? Bipolar disorder  Mother   ? Diabetes Mother   ? Colon polyps Mother   ? Irritable bowel syndrome Mother   ? Hypertension Father   ? Colon polyps Father   ? Celiac disease Sister   ? Pernicious anemia Sister   ? Peripheral vascular disease Maternal Grandmother   ?     died during carotid surgery  ? Colon cancer Neg Hx   ? Esophageal cancer Neg Hx   ? Rectal cancer Neg Hx   ? Stomach cancer Neg Hx   ? ? ?Social History  ? ?Socioeconomic History  ? Marital status: Divorced  ?  Spouse name: Not on file  ? Number of children: 2  ? Years of education: Not on file  ? Highest education level: Not on file  ?Occupational History  ? Occupation: Pharmacist, hospital  ?Tobacco Use  ? Smoking status: Never  ? Smokeless tobacco: Never  ?Vaping Use  ? Vaping Use: Never used  ?Substance and Sexual Activity  ? Alcohol use: Not Currently  ? Drug use: No  ? Sexual activity: Yes  ?  Partners: Male  ?  Birth control/protection: Other-see comments  ?  Comment: ablation  ?Other Topics Concern  ? Not on file  ?Social History Narrative  ? Married--separated.  ? Forensic psychologist.  ? Homemaker.  ? 1 daughter  ? 1 son  ? No T/A/Ds.  ?   ?   ?   ?   ? ?Social Determinants of Health  ? ?Financial Resource Strain: Not on file  ?Food Insecurity: Not on file  ?Transportation Needs: Not on file  ?Physical Activity: Not on file  ?Stress: Not on file  ?Social Connections: Not on file  ?Intimate Partner Violence: Not on file  ? ? ?Physical Exam: ?Vital signs in last 24 hours: ?'@BP'$  101/66   Pulse 80   Temp 97.8 ?F (36.6 ?C) (Temporal)   Ht '5\' 4"'$  (1.626 m)   Wt 231 lb (104.8 kg)   SpO2 97%   BMI 39.65 kg/m?  ?GEN: NAD ?EYE: Sclerae anicteric ?ENT: MMM ?CV: Non-tachycardic ?Pulm: CTA b/l ?GI: Soft, NT/ND ?NEURO:  Alert & Oriented x 3 ? ? ?Zenovia Jarred, MD ?[facility] ? ?08/31/2021 3:13 PM ? ?

## 2021-08-31 NOTE — Progress Notes (Signed)
Called to room to assist during endoscopic procedure.  Patient ID and intended procedure confirmed with present staff. Received instructions for my participation in the procedure from the performing physician.  

## 2021-08-31 NOTE — Op Note (Signed)
[facility] ?Patient Name: [name] ?Procedure Date: 08/31/2021 3:18 PM ?MRN: [id number] ?Endoscopist: Jerene Bears , MD ?Age: 46 ?Referring MD:  ?Date of Birth: 12-12-1975 ?Gender: Female ?Account #: [id number] ?Procedure:                Colonoscopy ?Indications:              Colon cancer screening in patient at increased  ?                          risk: Family history of 1st-degree relative  ?                          (mother) with colon polyps (>10 lifetime adenomas) ?Medicines:                Monitored Anesthesia Care ?Procedure:                Pre-Anesthesia Assessment: ?                          - Prior to the procedure, a History and Physical  ?                          was performed, and patient medications and  ?                          allergies were reviewed. The patient's tolerance of  ?                          previous anesthesia was also reviewed. The risks  ?                          and benefits of the procedure and the sedation  ?                          options and risks were discussed with the patient.  ?                          All questions were answered, and informed consent  ?                          was obtained. Prior Anticoagulants: The patient has  ?                          taken no previous anticoagulant or antiplatelet  ?                          agents. ASA Grade Assessment: II - A patient with  ?                          mild systemic disease. After reviewing the risks  ?                          and benefits, the patient was deemed in  ?  satisfactory condition to undergo the procedure. ?                          After obtaining informed consent, the colonoscope  ?                          was passed under direct vision. Throughout the  ?                          procedure, the patient's blood pressure, pulse, and  ?                          oxygen saturations were monitored continuously. The  ?                          CF [id number] #[id number]  was introduced through the anus  ?                          and advanced to the terminal ileum. The colonoscopy  ?                          was performed without difficulty. The patient  ?                          tolerated the procedure well. The quality of the  ?                          bowel preparation was good. The terminal ileum,  ?                          ileocecal valve, appendiceal orifice, and rectum  ?                          were photographed. ?Scope In: 3:28:05 PM ?Scope Out: 3:43:56 PM ?Scope Withdrawal Time: 0 hours 13 minutes 9 seconds  ?Total Procedure Duration: 0 hours 15 minutes 51 seconds  ?Findings:                 The digital rectal exam was normal. ?                          The terminal ileum appeared normal. ?                          A 6 mm polyp was found in the sigmoid colon. The  ?                          polyp was sessile. The polyp was removed with a  ?                          cold snare. Resection and retrieval were complete. ?                          The exam was otherwise without abnormality on  ?  direct and retroflexion views. ?Complications:            No immediate complications. ?Estimated Blood Loss:     Estimated blood loss was minimal. ?Impression:               - The examined portion of the ileum was normal. ?                          - One 6 mm polyp in the sigmoid colon, removed with  ?                          a cold snare. Resected and retrieved. ?                          - The examination was otherwise normal on direct  ?                          and retroflexion views. ?Recommendation:           - Patient has a contact number available for  ?                          emergencies. The signs and symptoms of potential  ?                          delayed complications were discussed with the  ?                          patient. Return to normal activities tomorrow.  ?                          Written discharge instructions were provided to  the  ?                          patient. ?                          - Resume previous diet. ?                          - Continue present medications. ?                          - Await pathology results. ?                          - Repeat colonoscopy is recommended. The  ?                          colonoscopy date will be determined after pathology  ?                          results from today's exam become available for  ?                          review. ?Jerene Bears, MD ?08/31/2021 3:48:10 PM ?This report has been  signed electronically. ?

## 2021-08-31 NOTE — Patient Instructions (Signed)

## 2021-09-02 ENCOUNTER — Telehealth: Payer: Self-pay | Admitting: *Deleted

## 2021-09-02 NOTE — Telephone Encounter (Signed)
?  Follow up Call- ? ? ?  08/31/2021  ?  2:41 PM  ?Call back number  ?Post procedure Call Back phone  # [phone]  ?Permission to leave phone message Yes  ?  ? ?Patient questions: ? ?Do you have a fever, pain , or abdominal swelling? No. ?Pain Score  0 * ? ?Have you tolerated food without any problems? Yes.   ? ?Have you been able to return to your normal activities? Yes.   ? ?Do you have any questions about your discharge instructions: ?Diet   No. ?Medications  No. ?Follow up visit  No. ? ?Do you have questions or concerns about your Care? No. ? ?Actions: ?* If pain score is 4 or above: ?No action needed, pain <4. ? ? ?

## 2021-09-03 ENCOUNTER — Encounter: Payer: Self-pay | Admitting: Internal Medicine

## 2022-01-28 ENCOUNTER — Encounter (HOSPITAL_BASED_OUTPATIENT_CLINIC_OR_DEPARTMENT_OTHER): Payer: Self-pay | Admitting: Nurse Practitioner

## 2022-01-28 ENCOUNTER — Other Ambulatory Visit (HOSPITAL_BASED_OUTPATIENT_CLINIC_OR_DEPARTMENT_OTHER): Payer: Self-pay

## 2022-01-28 ENCOUNTER — Encounter: Payer: Self-pay | Admitting: Family

## 2022-01-28 ENCOUNTER — Encounter (HOSPITAL_BASED_OUTPATIENT_CLINIC_OR_DEPARTMENT_OTHER): Payer: Self-pay | Admitting: Pharmacist

## 2022-01-28 ENCOUNTER — Ambulatory Visit (INDEPENDENT_AMBULATORY_CARE_PROVIDER_SITE_OTHER): Payer: BC Managed Care – PPO | Admitting: Nurse Practitioner

## 2022-01-28 VITALS — BP 122/88 | HR 71 | Ht 63.0 in | Wt 223.3 lb

## 2022-01-28 DIAGNOSIS — Z130 Encounter for screening for diseases of the blood and blood-forming organs and certain disorders involving the immune mechanism: Secondary | ICD-10-CM

## 2022-01-28 DIAGNOSIS — R002 Palpitations: Secondary | ICD-10-CM

## 2022-01-28 DIAGNOSIS — M25552 Pain in left hip: Secondary | ICD-10-CM | POA: Diagnosis not present

## 2022-01-28 DIAGNOSIS — Z13220 Encounter for screening for lipoid disorders: Secondary | ICD-10-CM | POA: Diagnosis not present

## 2022-01-28 DIAGNOSIS — Z1321 Encounter for screening for nutritional disorder: Secondary | ICD-10-CM

## 2022-01-28 DIAGNOSIS — E66812 Obesity, class 2: Secondary | ICD-10-CM | POA: Insufficient documentation

## 2022-01-28 DIAGNOSIS — Z1329 Encounter for screening for other suspected endocrine disorder: Secondary | ICD-10-CM

## 2022-01-28 DIAGNOSIS — H9312 Tinnitus, left ear: Secondary | ICD-10-CM | POA: Insufficient documentation

## 2022-01-28 DIAGNOSIS — Z0000 Encounter for general adult medical examination without abnormal findings: Secondary | ICD-10-CM | POA: Diagnosis not present

## 2022-01-28 DIAGNOSIS — Z13228 Encounter for screening for other metabolic disorders: Secondary | ICD-10-CM

## 2022-01-28 DIAGNOSIS — Z6839 Body mass index (BMI) 39.0-39.9, adult: Secondary | ICD-10-CM

## 2022-01-28 MED ORDER — ONDANSETRON HCL 8 MG PO TABS
8.0000 mg | ORAL_TABLET | Freq: Three times a day (TID) | ORAL | 2 refills | Status: DC | PRN
  Filled 2022-01-28: qty 18, 21d supply, fill #0
  Filled 2022-04-13: qty 18, 6d supply, fill #0

## 2022-01-28 MED ORDER — WEGOVY 0.25 MG/0.5ML ~~LOC~~ SOAJ
0.2500 mg | SUBCUTANEOUS | 2 refills | Status: DC
  Filled 2022-01-28: qty 2, 28d supply, fill #0

## 2022-01-28 NOTE — Assessment & Plan Note (Signed)
Symptoms of prolonged course of tinnitus in left ear with changing quality after COVID infection a few months ago. Decreased hearing noted on examination today with significant retraction of TM in the left ear. No associated sx. Will send referral to ENT and Audiology for evaluation.

## 2022-01-28 NOTE — Assessment & Plan Note (Signed)
Increased physical activity and strict dietary monitoring for one year with loss of 9 lbs. [name] is understandable frustrated and would like to know what can be done to improve her weight management. Given her BMI and increased lipids, I do feel that she would be a great candidate for [facility]. She is eager and working diligently for weight management independent of medication at this time. Will obtain labs today and plan to start wegovy.

## 2022-01-28 NOTE — Progress Notes (Signed)
Jacqueline Keeler, DNP, AGNP-c [address] [place], [place] [address] [phone] Office [phone] Fax  ESTABLISHED PATIENT- Chronic Health and/or Follow-Up Visit  Blood pressure 122/88, pulse 71, height '5\' 3"'$  (1.6 m), weight 223 lb 4.8 oz (101.3 kg), SpO2 97 %.    [name] is a 47 y.o. year old female presenting today for evaluation and management of the following: Follow-up (Patient presents for follow up blood work. She would like a referral to Dr Lucien Mons. Left ear is ringing)   Health care maintenance [name] would like to have updated labs today.  Hip Pain: Patient complains of left hip pain. Onset of the symptoms was several years ago. Inciting event: none. Current symptoms include pain in anterior central groin area, is worse with flexion of the hip, laying on the hip, wearing tight jeans . Associated symptoms: none. Aggravating symptoms: kneeling, squatting, and bending forward - flexion of the hip. Patient's course is consistent. Patient has had no prior hip problems. Previous visits for this problem: none. Evaluation to date: none.  Treatment to date: home exercise program, which has been ineffective.  Tinnitus: Patient presents with tinnitus. Onset of symptoms was last school year and was on and off, but was getting louder a few months ago. Course fluctuating. She tells me a few months ago she had covid and everything went "quiet" then it has started back but it sounds like a "tweetling" but it is softer now than before COVID. Patient describes the tinnitus as constant located in the left ear. The quality is described as variable pitch that sounds like music. The pattern is nonpulsatile with an intensity that is medium. Patient describes her level of annoyance as minimally annoying, intermittently aware. Associated symptoms include no hearing loss, pain, dizziness, drainage or recurrent otitis. Family history is  negative family history for tinnitus Patient has had no prior evaluation, treatment or surgery for tinnitus Patient does not have hearing aids at this time. Previous treatments include none.   SHe has been walking every day since May for three miles. She is down 9 lbs since last year.  Weight is a concern  All ROS negative with exception of what is listed above.   PHYSICAL EXAM Physical Exam Vitals and nursing note reviewed.  Constitutional:      General: She is not in acute distress.    Appearance: Normal appearance.  HENT:     Head: Normocephalic.     Right Ear: Hearing, tympanic membrane, ear canal and external ear normal. No middle ear effusion.     Left Ear: Ear canal and external ear normal. Decreased hearing noted.  No middle ear effusion. Tympanic membrane is retracted. Tympanic membrane has decreased mobility.     Nose: Nose normal.     Mouth/Throat:     Mouth: Mucous membranes are moist.  Eyes:     Extraocular Movements: Extraocular movements intact.     Pupils: Pupils are equal, round, and reactive to light.  Neck:     Vascular: No carotid bruit.  Cardiovascular:     Rate and Rhythm: Normal rate and regular rhythm.     Pulses: Normal pulses.     Heart sounds: Normal heart sounds.  Pulmonary:     Effort: Pulmonary effort is normal.     Breath sounds: Normal breath sounds.  Musculoskeletal:        General: No swelling.     Cervical back: Normal range of motion.  Right lower leg: No edema.     Left lower leg: No edema.       Legs:  Lymphadenopathy:     Cervical: No cervical adenopathy.  Skin:    General: Skin is warm and dry.     Capillary Refill: Capillary refill takes less than 2 seconds.  Neurological:     General: No focal deficit present.     Mental Status: She is alert and oriented to person, place, and time.  Psychiatric:        Mood and Affect: Mood normal.        Behavior: Behavior normal.        Thought Content: Thought content normal.         Judgment: Judgment normal.     PLAN Problem List Items Addressed This Visit     Class 2 severe obesity due to excess calories with serious comorbidity and body mass index (BMI) of 39.0 to 39.9 in adult [facility])    Increased physical activity and strict dietary monitoring for one year with loss of 9 lbs. [name] is understandable frustrated and would like to know what can be done to improve her weight management. Given her BMI and increased lipids, I do feel that she would be a great candidate for [facility]. She is eager and working diligently for weight management independent of medication at this time. Will obtain labs today and plan to start wegovy.       Relevant Medications   Semaglutide-Weight Management (WEGOVY) 0.25 MG/0.5ML SOAJ   ondansetron (ZOFRAN) 8 MG tablet   Other Relevant Orders   Lipid panel   Hemoglobin A1c   Left hip pain - Primary    Anterior hip pain centered at the proximal end of the left thigh. Worse with hip flexion. No pain with walking. Abduction/Adduction normal with no pain. Strength equal. Consider possible hip flexor pain vs nerve impingement. Will send referral for ortho evaluation.       Relevant Orders   AMB referral to orthopedics   Tinnitus of left ear    Symptoms of prolonged course of tinnitus in left ear with changing quality after COVID infection a few months ago. Decreased hearing noted on examination today with significant retraction of TM in the left ear. No associated sx. Will send referral to ENT and Audiology for evaluation.       Relevant Orders   Ambulatory referral to ENT   Ambulatory referral to Audiology   Other Visit Diagnoses     Health care maintenance       Relevant Orders   Comprehensive metabolic panel   CBC with Differential/Platelet   Lipid panel   Hemoglobin A1c   VITAMIN D 25 Hydroxy (Vit-D Deficiency, Fractures)   TSH   Screening for deficiency anemia       Relevant Orders   CBC with Differential/Platelet   Iron, TIBC  and Ferritin Panel   Screening for lipid disorders       Relevant Orders   Lipid panel   Screening for endocrine, nutritional, metabolic and immunity disorder       Relevant Orders   Comprehensive metabolic panel   Hemoglobin A1c   VITAMIN D 25 Hydroxy (Vit-D Deficiency, Fractures)   TSH   Intermittent palpitations       Relevant Orders   TSH   Iron, TIBC and Ferritin Panel       Return in about 3 months (around 04/30/2022) for Phone call weight.   Jacqueline Keeler, DNP, AGNP-c

## 2022-01-28 NOTE — Assessment & Plan Note (Signed)
Anterior hip pain centered at the proximal end of the left thigh. Worse with hip flexion. No pain with walking. Abduction/Adduction normal with no pain. Strength equal. Consider possible hip flexor pain vs nerve impingement. Will send referral for ortho evaluation.

## 2022-01-28 NOTE — Patient Instructions (Addendum)
It was a pleasure seeing you today. I hope your time spent with [place] was pleasant and helpful. Please let us know if there is anything we can do to improve the service you receive.   I am so proud of you!!  I have sent the [facility] in for you I have sent the referral for your hip and for your ear  [email or web address]  Symptoms of Hip Flexor Pain Hip flexor pain may develop gradually or appear following a trauma, such as a fall. Many people with hip flexor pain report one or more of the following:  Constant aching pain or discomfort in the groin or hip, even when sitting Decreased range of motion that is especially noticeable when kicking, lunging, running, and bending Tenderness, swelling, and bruising in the upper leg or groin; the affected area may hurt when pressed Muscle spasms and/or cramping in the hip or thigh that are painful and affect movement Weakness in the groin region that may make certain activities, such as kicking, difficult or impossible Change in gait, because of pain, decreased range of motion, and other factors affect walking Hip flexor pain is typically made worse during certain activities or during specific movements, such as:  Prolonged sitting, such as sitting during the day at an office job or a long car trip Going up or down stairs Bending the knee to the chest (for example, to tie a shoe) Bending over to pick something up Pushing off the affected leg to change direction while running or skating A person does not have to identify with all of these triggers to have hip flexor pain.  The following orders have been placed for you today:  No orders of the defined types were placed in this encounter.    Important Office Information Lab Results If labs were ordered, please note that you will see results through [facility] as soon as they come available from [facility].  It takes up to 5 business days  for the results to be routed to me and for me to review them once all of the lab results have come through from [facility]. I will make recommendations based on your results and send these through [place] or someone from the office will call you to discuss. If your labs are abnormal, we may contact you to schedule a visit to discuss the results and make recommendations.  If you have not heard from [place] within 5 business days or you have waited longer than a week and your lab results have not come through on [place], please feel free to call the office or send a message through [facility] to follow-up on these labs.   Referrals If referrals were placed today, the office where the referral was sent will contact you either by phone or through [place] to set up scheduling. Please note that it can take up to a week for the referral office to contact you. If you do not hear from them in a week, please contact the referral office directly to inquire about scheduling.   Condition Treated If your condition worsens or you begin to have new symptoms, please schedule a follow-up appointment for further evaluation. If you are not sure if an appointment is needed, you may call the office to leave a message for the nurse and someone will contact you with recommendations.  If you have an urgent or life threatening emergency, please do not call the office, but seek emergency evaluation by calling 911 or going to the nearest emergency room  for evaluation.   MyChart and Phone Calls Please do not use MyChart for urgent messages. It may take up to 3 business days for MyChart messages to be read by staff and if they are unable to handle the request, an additional 3 business days for them to be routed to me and for my response.  Messages sent to the provider through [place] do not come directly to the provider, please allow time for these messages to be routed and for me to respond.  We get a large volume of MyChart messages daily  and these are responded to in the order received.   For urgent messages, please call the office at [phone] and speak with the front office staff or leave a message on the line of my assistant for guidance.  We are seeing patients from the hours of 8:00 am through 5:00 pm and calls directly to the nurse may not be answered immediately due to seeing patients, but your call will be returned as soon as possible.  Phone  messages received after 4:00 PM Monday through Thursday may not be returned until the following business day. Phone messages received after 11:00 AM on Friday may not be returned until Monday.   After Hours We share on call hours with providers from other offices. If you have an urgent need after hours that cannot wait until the next business day, please contact the on call provider by calling the office number. A nurse will speak with you and contact the provider if needed for recommendations.  If you have an urgent or life threatening emergency after hours, please do not call the on call provider, but seek emergency evaluation by calling 911 or going to the nearest emergency room for evaluation.   Paperwork All paperwork requires a minimum of 5 days to complete and return to you or the designated personnel. Please keep this in mind when bringing in forms or sending requests for paperwork completion to the office.

## 2022-01-29 ENCOUNTER — Ambulatory Visit (INDEPENDENT_AMBULATORY_CARE_PROVIDER_SITE_OTHER): Payer: BC Managed Care – PPO

## 2022-01-29 ENCOUNTER — Other Ambulatory Visit (HOSPITAL_BASED_OUTPATIENT_CLINIC_OR_DEPARTMENT_OTHER): Payer: Self-pay

## 2022-01-29 ENCOUNTER — Ambulatory Visit (INDEPENDENT_AMBULATORY_CARE_PROVIDER_SITE_OTHER): Payer: BC Managed Care – PPO | Admitting: Orthopaedic Surgery

## 2022-01-29 DIAGNOSIS — M25552 Pain in left hip: Secondary | ICD-10-CM

## 2022-01-29 DIAGNOSIS — M67959 Unspecified disorder of synovium and tendon, unspecified thigh: Secondary | ICD-10-CM

## 2022-01-29 LAB — COMPREHENSIVE METABOLIC PANEL
ALT: 12 IU/L (ref 0–32)
AST: 13 IU/L (ref 0–40)
Albumin/Globulin Ratio: 1.7 (ref 1.2–2.2)
Albumin: 4.2 g/dL (ref 3.9–4.9)
Alkaline Phosphatase: 72 IU/L (ref 44–121)
BUN/Creatinine Ratio: 19 (ref 9–23)
BUN: 13 mg/dL (ref 6–24)
Bilirubin Total: 0.4 mg/dL (ref 0.0–1.2)
CO2: 21 mmol/L (ref 20–29)
Calcium: 9.5 mg/dL (ref 8.7–10.2)
Chloride: 102 mmol/L (ref 96–106)
Creatinine, Ser: 0.7 mg/dL (ref 0.57–1.00)
Globulin, Total: 2.5 g/dL (ref 1.5–4.5)
Glucose: 90 mg/dL (ref 70–99)
Potassium: 4.7 mmol/L (ref 3.5–5.2)
Sodium: 138 mmol/L (ref 134–144)
Total Protein: 6.7 g/dL (ref 6.0–8.5)
eGFR: 110 mL/min/{1.73_m2} (ref >59)

## 2022-01-29 LAB — CBC WITH DIFFERENTIAL/PLATELET
Basophils Absolute: 0.1 10*3/uL (ref 0.0–0.2)
Basos: 1 %
EOS (ABSOLUTE): 0.1 10*3/uL (ref 0.0–0.4)
Eos: 1 %
Hematocrit: 42.2 % (ref 34.0–46.6)
Hemoglobin: 14.2 g/dL (ref 11.1–15.9)
Immature Grans (Abs): 0.1 10*3/uL (ref 0.0–0.1)
Immature Granulocytes: 1 %
Lymphocytes Absolute: 2.8 10*3/uL (ref 0.7–3.1)
Lymphs: 40 %
MCH: 29.3 pg (ref 26.6–33.0)
MCHC: 33.6 g/dL (ref 31.5–35.7)
MCV: 87 fL (ref 79–97)
Monocytes Absolute: 0.5 10*3/uL (ref 0.1–0.9)
Monocytes: 7 %
Neutrophils Absolute: 3.6 10*3/uL (ref 1.4–7.0)
Neutrophils: 50 %
Platelets: 303 10*3/uL (ref 150–450)
RBC: 4.84 x10E6/uL (ref 3.77–5.28)
RDW: 12.6 % (ref 11.7–15.4)
WBC: 7 10*3/uL (ref 3.4–10.8)

## 2022-01-29 LAB — HEMOGLOBIN A1C
Est. average glucose Bld gHb Est-mCnc: 111 mg/dL
Hgb A1c MFr Bld: 5.5 % (ref 4.8–5.6)

## 2022-01-29 LAB — IRON,TIBC AND FERRITIN PANEL
Ferritin: 154 ng/mL — ABNORMAL HIGH (ref 15–150)
Iron Saturation: 30 % (ref 15–55)
Iron: 83 ug/dL (ref 27–159)
Total Iron Binding Capacity: 278 ug/dL (ref 250–450)
UIBC: 195 ug/dL (ref 131–425)

## 2022-01-29 LAB — LIPID PANEL
Chol/HDL Ratio: 3.3 ratio (ref 0.0–4.4)
Cholesterol, Total: 173 mg/dL (ref 100–199)
HDL: 53 mg/dL (ref >39)
LDL Chol Calc (NIH): 107 mg/dL — ABNORMAL HIGH (ref 0–99)
Triglycerides: 66 mg/dL (ref 0–149)
VLDL Cholesterol Cal: 13 mg/dL (ref 5–40)

## 2022-01-29 LAB — TSH: TSH: 0.919 u[IU]/mL (ref 0.450–4.500)

## 2022-01-29 LAB — VITAMIN D 25 HYDROXY (VIT D DEFICIENCY, FRACTURES): Vit D, 25-Hydroxy: 32.7 ng/mL (ref 30.0–100.0)

## 2022-01-29 NOTE — Progress Notes (Signed)
Chief Complaint: Left hip pain     History of Present Illness:    [name] is a 46 y.o. female presents today with left hip pain has been going on worse for the last 16 months.  She is experiencing pain about the lateral aspect of the hip.  She has a hard time laying on that side.  She is very active and walks approximately 3 miles a day.  She is Oncologist.  She states that figure-of-four position is the most painful.    Surgical History:   None  PMH/PSH/Family History/Social History/Meds/Allergies:    Past Medical History:  Diagnosis Date   Abnormal uterine bleeding due to adenomyosis 03/16/2018   Allergic rhinitis    CAP (community acquired pneumonia) 07/20/2013   Cervical spondylosis    Right radiculopathy->MRI confirmed right sided C7 compression 11/13/19   Diverticulosis    H/O LEEP    1999 and 2000.  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 Symptomatic : fatigue and hair loss.  Intol of oral iron.  Hematology saw her 12/25/15 and did feraheme x 2 doses.  Pt's symptoms abated nicely with this. She has avg'd 2 doses of IV iron per year (as of 11/2018).   Menorrhagia    pelvic ultrasound 02/2018-->possible adenomyosis per GYN.  Ongoing GYN mgmt/considering ablation as of 03/2019.   Menorrhagia with regular cycle 12/31/2015   Need for prophylactic vaccination and inoculation against influenza 01/28/2016   Obesity, Class II, BMI 35-39.9 09/29/2007   Qualifier: Diagnosis of  By: Wynona Luna    Ocular migraine    Plantar fasciitis of left foot 07/12/2012   TMJ pain dysfunction syndrome 2015   (causing otalgia/referred pain)-ENT is Dr. Redmond Baseman   Vitamin D deficiency    Past Surgical History:  Procedure Laterality Date    CESAREAN SECTION      2007 & 2010   CHOLECYSTECTOMY     2004 (for dysfunctional GB not for stones)   COLONOSCOPY     Severe diverticulosis from transverse to sigmoid colon, with increased spasm and tortuosity: likely cause of her urgency.  Recall 5 yrs (Dr. Lyla Son).   COLPOSCOPY  04/23/2019   endom polypectomy  05/07/2019   ENDOMETRIAL ABLATION  05/07/2019   LAPAROSCOPY FOR ECTOPIC PREGNANCY  07/26/2007   salpingostomy for ectopic   LEEP  12/1997 & 04/1998   CIN III   TYMPANOSTOMY TUBE PLACEMENT  as a child   Social History   Socioeconomic History   Marital status: Divorced    Spouse name: Not on file   Number of children: 2   Years of education: Not on file   Highest education level: Not on file  Occupational History   Occupation: Pharmacist, hospital  Tobacco Use   Smoking status: Never   Smokeless tobacco: Never  Vaping Use   Vaping Use: Never used  Substance and Sexual Activity   Alcohol use: Not Currently   Drug use: No   Sexual activity: Yes    Partners: Male    Birth control/protection: Other-see comments    Comment: ablation  Other Topics  Concern   Not on file  Social History Narrative   Married--separated.   College graduate.   Homemaker.   1 daughter   1 son   No T/A/Ds.               Social Determinants of Health   Financial Resource Strain: Not on file  Food Insecurity: Not on file  Transportation Needs: Not on file  Physical Activity: Not on file  Stress: Not on file  Social Connections: Not on file   Family History  Problem Relation Age of Onset   Bipolar disorder Mother    Diabetes Mother    Colon polyps Mother    Irritable bowel syndrome Mother    Hypertension Father    Colon polyps Father    Celiac disease Sister    Pernicious anemia Sister    Peripheral vascular disease Maternal Grandmother        died during carotid surgery   Colon cancer Neg Hx    Esophageal cancer Neg Hx    Rectal cancer Neg Hx    Stomach cancer Neg Hx     Allergies  Allergen Reactions   Sulfa Antibiotics Hives   Current Outpatient Medications  Medication Sig Dispense Refill   ondansetron (ZOFRAN) 8 MG tablet Take 1 tablet (8 mg total) by mouth every 8 (eight) hours as needed for nausea or vomiting. 20 tablet 2   Semaglutide-Weight Management (WEGOVY) 0.25 MG/0.5ML SOAJ Inject 0.25 mg into the skin once a week. 2 mL 2   No current facility-administered medications for this visit.   No results found.  Review of Systems:   A ROS was performed including pertinent positives and negatives as documented in the HPI.  Physical Exam :   Constitutional: NAD and appears stated age Neurological: Alert and oriented Psych: Appropriate affect and cooperative There were no vitals taken for this visit.   Comprehensive Musculoskeletal Exam:    Inspection Right Left  Skin No atrophy or gross abnormalities appreciated No atrophy or gross abnormalities appreciated  Palpation    Tenderness None Lateral trochanteric  Crepitus None None  Range of Motion    Flexion (passive) 120 120  Extension 30 30  IR 30 30  ER 45 45 with pain  Strength    Flexion  5/5 5/5  Extension 5/5 5/5  Special Tests    FABER Negative Negative  FADIR Negative Negative  ER Lag/Capsular Insufficiency Negative Negative  Instability Negative Negative  Sacroiliac pain Negative  Negative   Instability    Generalized Laxity No No  Neurologic    sciatic, femoral, obturator nerves intact to light sensation  Vascular/Lymphatic    DP pulse 2+ 2+  Lumbar Exam    Patient has symmetric lumbar range of motion with negative pain referral to hip   Positive Trendelenburg with weakness on left  Imaging:   Xray (4 views left hip): Normal   I personally reviewed and interpreted the radiographs.   Assessment:   46 y.o. 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  At this time I recommended ultrasound-guided injection of  lateral hip and strengthening exercises which she will pursue.  I will plan to see her back in 6 weeks for reassessment  Plan :    -Return to clinic in 6 weeks for reassessment     I personally saw and evaluated the patient, and participated in the management and treatment plan.  Vanetta Mulders, MD Attending Physician, Orthopedic Surgery  This document was dictated using Systems analyst. A reasonable attempt at proof reading has been made to minimize errors.

## 2022-02-02 ENCOUNTER — Encounter (HOSPITAL_BASED_OUTPATIENT_CLINIC_OR_DEPARTMENT_OTHER): Payer: Self-pay | Admitting: Nurse Practitioner

## 2022-02-04 ENCOUNTER — Other Ambulatory Visit (HOSPITAL_BASED_OUTPATIENT_CLINIC_OR_DEPARTMENT_OTHER): Payer: Self-pay

## 2022-02-06 ENCOUNTER — Other Ambulatory Visit (HOSPITAL_BASED_OUTPATIENT_CLINIC_OR_DEPARTMENT_OTHER): Payer: Self-pay

## 2022-02-10 ENCOUNTER — Telehealth (HOSPITAL_BASED_OUTPATIENT_CLINIC_OR_DEPARTMENT_OTHER): Payer: Self-pay

## 2022-02-10 NOTE — Telephone Encounter (Signed)
Received notice of approval for [facility] 02/03/2022-09/04/2022 from [facility]

## 2022-02-11 ENCOUNTER — Other Ambulatory Visit (HOSPITAL_BASED_OUTPATIENT_CLINIC_OR_DEPARTMENT_OTHER): Payer: Self-pay

## 2022-02-12 ENCOUNTER — Other Ambulatory Visit (HOSPITAL_BASED_OUTPATIENT_CLINIC_OR_DEPARTMENT_OTHER): Payer: Self-pay

## 2022-02-15 ENCOUNTER — Other Ambulatory Visit (HOSPITAL_BASED_OUTPATIENT_CLINIC_OR_DEPARTMENT_OTHER): Payer: Self-pay

## 2022-03-03 ENCOUNTER — Other Ambulatory Visit (HOSPITAL_BASED_OUTPATIENT_CLINIC_OR_DEPARTMENT_OTHER): Payer: Self-pay

## 2022-03-04 ENCOUNTER — Other Ambulatory Visit (HOSPITAL_BASED_OUTPATIENT_CLINIC_OR_DEPARTMENT_OTHER): Payer: Self-pay

## 2022-03-10 ENCOUNTER — Ambulatory Visit (HOSPITAL_BASED_OUTPATIENT_CLINIC_OR_DEPARTMENT_OTHER): Payer: BC Managed Care – PPO | Admitting: Orthopaedic Surgery

## 2022-03-10 ENCOUNTER — Telehealth (HOSPITAL_BASED_OUTPATIENT_CLINIC_OR_DEPARTMENT_OTHER): Payer: Self-pay | Admitting: Orthopaedic Surgery

## 2022-03-10 NOTE — Telephone Encounter (Signed)
Appt

## 2022-04-13 ENCOUNTER — Other Ambulatory Visit (HOSPITAL_BASED_OUTPATIENT_CLINIC_OR_DEPARTMENT_OTHER): Payer: Self-pay

## 2022-04-14 ENCOUNTER — Encounter: Payer: Self-pay | Admitting: Nurse Practitioner

## 2022-04-15 ENCOUNTER — Encounter (HOSPITAL_BASED_OUTPATIENT_CLINIC_OR_DEPARTMENT_OTHER): Payer: Self-pay | Admitting: Pharmacist

## 2022-04-15 ENCOUNTER — Other Ambulatory Visit (HOSPITAL_BASED_OUTPATIENT_CLINIC_OR_DEPARTMENT_OTHER): Payer: Self-pay

## 2022-04-15 MED ORDER — WEGOVY 0.5 MG/0.5ML ~~LOC~~ SOAJ
0.5000 mg | SUBCUTANEOUS | 1 refills | Status: DC
  Filled 2022-04-15: qty 2, 28d supply, fill #0
  Filled 2022-06-29: qty 2, 28d supply, fill #1

## 2022-04-21 ENCOUNTER — Other Ambulatory Visit (HOSPITAL_BASED_OUTPATIENT_CLINIC_OR_DEPARTMENT_OTHER): Payer: Self-pay

## 2022-05-05 ENCOUNTER — Telehealth (HOSPITAL_BASED_OUTPATIENT_CLINIC_OR_DEPARTMENT_OTHER): Payer: BC Managed Care – PPO | Admitting: Nurse Practitioner

## 2022-05-08 ENCOUNTER — Other Ambulatory Visit (HOSPITAL_BASED_OUTPATIENT_CLINIC_OR_DEPARTMENT_OTHER): Payer: Self-pay

## 2022-05-12 ENCOUNTER — Other Ambulatory Visit: Payer: Self-pay

## 2022-05-28 ENCOUNTER — Ambulatory Visit: Payer: BC Managed Care – PPO | Admitting: Medical

## 2022-05-28 ENCOUNTER — Encounter: Payer: Self-pay | Admitting: Family

## 2022-05-28 VITALS — BP 120/78 | HR 73 | Temp 97.2°F | Wt 228.6 lb

## 2022-05-28 DIAGNOSIS — B029 Zoster without complications: Secondary | ICD-10-CM

## 2022-05-28 DIAGNOSIS — R109 Unspecified abdominal pain: Secondary | ICD-10-CM | POA: Diagnosis not present

## 2022-05-28 DIAGNOSIS — R238 Other skin changes: Secondary | ICD-10-CM | POA: Diagnosis not present

## 2022-05-28 LAB — POCT URINALYSIS DIP (PROADVANTAGE DEVICE)
Bilirubin, UA: NEGATIVE
Glucose, UA: NEGATIVE mg/dL
Ketones, POC UA: NEGATIVE mg/dL
Leukocytes, UA: NEGATIVE
Nitrite, UA: NEGATIVE
Protein Ur, POC: NEGATIVE mg/dL
Specific Gravity, Urine: 1.015
Urobilinogen, Ur: NEGATIVE
pH, UA: 6.5 (ref 5.0–8.0)

## 2022-05-28 MED ORDER — VALACYCLOVIR HCL 1 G PO TABS: 1000.0000 mg | ORAL_TABLET | Freq: Three times a day (TID) | ORAL | 0 refills | Status: DC

## 2022-05-28 MED ORDER — IBUPROFEN 800 MG PO TABS: 800.0000 mg | ORAL_TABLET | Freq: Three times a day (TID) | ORAL | 0 refills | Status: DC | PRN

## 2022-05-28 NOTE — Progress Notes (Signed)
Subjective:  [name] is a 47 y.o. female who presents for Chief Complaint  Patient presents with   possible shingles    Possible shingles- no rash but having severe pain- burning feels like her skin is on fire. Left hip     Here for possible shingles.  No prior history of shingles but since 5 days ago he has felt a sensation of sensitivity and burning in her left hip area.  awoke this past Sunday with ache in her left hip and flank area.  Started spreading posterior and anterior, feels like skin is burning.  No rash yet, but worried about shingles.  She did have chicken pox as a child.  She notes some stress but no 1 particular stressor recently.  No recent illness.  Her mother had shingles back around November 2023.  No treatment currently.  No urinary symptoms, no polyuria, no urgency, no frequency.  No history of kidney stone.  No bowel changes, or diarrhea.  No nausea or vomiting.  No fever, no pain with leg movements.  She does have a history of some mild arthritis in left hip but has not really had hip issues lately.  No other aggravating or relieving factors.    No other c/o.  The following portions of the patient's history were reviewed and updated as appropriate: allergies, current medications, past family history, past medical history, past social history, past surgical history and problem list.  ROS Otherwise as in subjective above    Objective: BP 120/78   Pulse 73   Temp (!) 97.2 F (36.2 C)   Wt 228 lb 9.6 oz (103.7 kg)   BMI 40.49 kg/m   General appearance: alert, no distress, well developed, well nourished Abdomen: +bs, soft, mild suprapubic and epigastric tendnerss in the left side in general but no distinct other focal tenderness.   Otherwise non tender, non distended, no masses, no hepatomegaly, no splenomegaly Pulses: 2+ radial pulses, 2+ pedal pulses, normal cap refill Ext: no edema Skin : no obvious rash of left lateral thigh, back or buttock, exam  chaperoned by nurse    Assessment: Encounter Diagnoses  Name Primary?   Herpes zoster without complication Yes   Skin sensitivity    Flank pain      Plan: Strongly suspect shingles despite no rash on exam.  Discussed other differential, but likely shingles  Discussed the symptoms, exam findings, suggestive of shingles outbreak.  discussed transmission, precautions to prevent spread to others, particularly high risk groups as discussed including young children, elderly, immunocompromised people, or pregnant women.  Discussed treatment including relative rest, hydration, can use OTC Cortaid topically but don't let others use the cream and discard the cream after this episode of shingles.     Begin Valtrex as below, can use NSAID OTC.   discussed the possibility of post herpetic neuralgia.   answered their questions, After visit summary given.   If new symptoms or worse over weekend, consider recheck or call after hours line.  [name] was seen today for possible shingles.  Diagnoses and all orders for this visit:  Herpes zoster without complication  Skin sensitivity  Flank pain -     POCT Urinalysis DIP (Proadvantage Device)  Other orders -     valACYclovir (VALTREX) 1000 MG tablet; Take 1 tablet (1,000 mg total) by mouth 3 (three) times daily. -     ibuprofen (ADVIL) 800 MG tablet; Take 1 tablet (800 mg total) by mouth every 8 (eight) hours as  needed.    Follow up: prn

## 2022-05-28 NOTE — Patient Instructions (Signed)

## 2022-06-29 ENCOUNTER — Other Ambulatory Visit: Payer: Self-pay | Admitting: Nurse Practitioner

## 2022-06-29 ENCOUNTER — Other Ambulatory Visit (HOSPITAL_BASED_OUTPATIENT_CLINIC_OR_DEPARTMENT_OTHER): Payer: Self-pay

## 2022-06-29 MED ORDER — SEMAGLUTIDE-WEIGHT MANAGEMENT 2.4 MG/0.75ML ~~LOC~~ SOAJ
2.4000 mg | SUBCUTANEOUS | 3 refills | Status: DC
  Filled 2022-06-29: qty 3, fill #0

## 2022-06-29 MED ORDER — SEMAGLUTIDE-WEIGHT MANAGEMENT 1.7 MG/0.75ML ~~LOC~~ SOAJ
1.7000 mg | SUBCUTANEOUS | 3 refills | Status: DC
  Filled 2022-06-29 – 2022-07-24 (×4): qty 3, 28d supply, fill #0

## 2022-06-29 MED ORDER — SEMAGLUTIDE-WEIGHT MANAGEMENT 1 MG/0.5ML ~~LOC~~ SOAJ
1.0000 mg | SUBCUTANEOUS | 3 refills | Status: DC
  Filled 2022-06-29: qty 2, 28d supply, fill #0

## 2022-06-30 ENCOUNTER — Other Ambulatory Visit (HOSPITAL_BASED_OUTPATIENT_CLINIC_OR_DEPARTMENT_OTHER): Payer: Self-pay

## 2022-07-22 ENCOUNTER — Other Ambulatory Visit (HOSPITAL_BASED_OUTPATIENT_CLINIC_OR_DEPARTMENT_OTHER): Payer: Self-pay

## 2022-07-24 ENCOUNTER — Other Ambulatory Visit (HOSPITAL_BASED_OUTPATIENT_CLINIC_OR_DEPARTMENT_OTHER): Payer: Self-pay

## 2022-08-10 ENCOUNTER — Ambulatory Visit: Payer: BC Managed Care – PPO | Admitting: Nurse Practitioner

## 2022-08-10 ENCOUNTER — Encounter: Payer: Self-pay | Admitting: Nurse Practitioner

## 2022-08-10 VITALS — BP 120/82 | HR 69 | Wt 216.0 lb

## 2022-08-10 DIAGNOSIS — E559 Vitamin D deficiency, unspecified: Secondary | ICD-10-CM

## 2022-08-10 DIAGNOSIS — E88819 Insulin resistance, unspecified: Secondary | ICD-10-CM

## 2022-08-10 DIAGNOSIS — E611 Iron deficiency: Secondary | ICD-10-CM

## 2022-08-10 DIAGNOSIS — Z6839 Body mass index (BMI) 39.0-39.9, adult: Secondary | ICD-10-CM

## 2022-08-10 NOTE — Progress Notes (Signed)
Shawna Clamp, DNP, AGNP-c [facility] Medicine  [address] [place], [place] [address] [phone]  ESTABLISHED PATIENT- Chronic Health and/or Follow-Up Visit  Blood pressure 120/82, pulse 69, weight 216 lb (98 kg).    [name] is a 47 y.o. year old female presenting today for evaluation and management of chronic conditions.  [name] presents today with concerns regarding her weight loss efforts. Since starting [facility] she has had successful weight loss of 20 pounds, increased energy, and generalized improved feeling of health. She expresses satisfaction with the weight loss. However, she is troubled by the discontinuation of her medication coverage by the [facility]. She has a history of intermittent elevated blood pressure readings, high cholesterol and strong family history of diabetes. She has seen significant improvement in her blood pressure since weight loss. She is very concerned that the cessation of the medication will increase her risks for further exacerbation of her chronic conditions and increased risk of developing diabetes. Prior to starting [facility] she struggled with weight management for many years despite strict diet and exercise regimens. She would like to know if there are alternative options for her to help maintain and continue to loose weight.    All ROS negative with exception of what is listed above.   PHYSICAL EXAM Physical Exam Vitals and nursing note reviewed.  Constitutional:      Appearance: Normal appearance.  HENT:     Head: Normocephalic.  Eyes:     Pupils: Pupils are equal, round, and reactive to light.  Neck:     Vascular: No carotid bruit.  Cardiovascular:     Rate and Rhythm: Normal rate and regular rhythm.     Pulses: Normal pulses.     Heart sounds: Normal heart sounds.  Pulmonary:     Effort: Pulmonary effort is normal.     Breath sounds: Normal breath sounds.  Abdominal:     General:  Bowel sounds are normal.     Palpations: Abdomen is soft.  Musculoskeletal:     Cervical back: Normal range of motion.  Lymphadenopathy:     Cervical: No cervical adenopathy.  Skin:    General: Skin is warm and dry.     Capillary Refill: Capillary refill takes less than 2 seconds.  Neurological:     General: No focal deficit present.     Mental Status: She is alert and oriented to person, place, and time.  Psychiatric:        Mood and Affect: Mood normal.     PLAN Problem List Items Addressed This Visit     Class 2 severe obesity due to excess calories with serious comorbidity and body mass index (BMI) of 39.0 to 39.9 in adult - Primary    [name] has had a 20-pound weight loss since starting Wegovy. Unfortunately, this medication is no longer covered by her health insurance plan. Given her risk of chronic conditions and due to family history of personal history, I do feel that continuation of the medication would be vitally important for proactive approach to maintaining her health. She has tried phentermine in the past, but this was only minimally effective and not a long term option. The cost of GLP-1 medications is prohibitive for her, even with coupon discounts. We could consider trial of oral medications such as Osymia or Contrave. At this time, I feel getting labs to determine current state may be the best option. If she is found to have elevated A1c, we can consider  transition to Ozempic or Mounjaro. We will monitor insulin levels for possible insulin resistance presence. If this is present, I feel that advocating for Ozempic would be in her best interest.  Plan: - Monitor labs today - Continue with what you have left of the [facility]. You can start to space this apart by 1.5-2 weeks to see if you have continued success.  - If we are able to determine insulin resistance, we may be able to get approval for Ozempic.        Relevant Orders   VITAMIN D 25 Hydroxy (Vit-D Deficiency,  Fractures) (Completed)   Iron, TIBC and Ferritin Panel (Completed)   CBC with Differential/Platelet (Completed)   Insulin, Free and Total (Completed)   Comprehensive metabolic panel (Completed)   Iron deficiency   Relevant Orders   VITAMIN D 25 Hydroxy (Vit-D Deficiency, Fractures) (Completed)   Iron, TIBC and Ferritin Panel (Completed)   CBC with Differential/Platelet (Completed)   Insulin, Free and Total (Completed)   Comprehensive metabolic panel (Completed)   Other Visit Diagnoses     Vitamin D deficiency       Relevant Orders   VITAMIN D 25 Hydroxy (Vit-D Deficiency, Fractures) (Completed)   Iron, TIBC and Ferritin Panel (Completed)   CBC with Differential/Platelet (Completed)   Insulin, Free and Total (Completed)   Comprehensive metabolic panel (Completed)   Insulin resistance, unspecified       Relevant Orders   VITAMIN D 25 Hydroxy (Vit-D Deficiency, Fractures) (Completed)   Iron, TIBC and Ferritin Panel (Completed)   CBC with Differential/Platelet (Completed)   Insulin, Free and Total (Completed)   Comprehensive metabolic panel (Completed)       Return in about 6 months (around 02/09/2023) for med mgmt.   Shawna Clamp, DNP, AGNP-c 08/10/2022  3:47 PM

## 2022-08-11 LAB — CBC WITH DIFFERENTIAL/PLATELET
Basos: 1 %
EOS (ABSOLUTE): 0.1 10*3/uL (ref 0.0–0.4)
Hematocrit: 41 % (ref 34.0–46.6)
MCHC: 33.9 g/dL (ref 31.5–35.7)
Monocytes Absolute: 0.5 10*3/uL (ref 0.1–0.9)
RBC: 4.62 x10E6/uL (ref 3.77–5.28)
RDW: 12.6 % (ref 11.7–15.4)

## 2022-08-11 LAB — COMPREHENSIVE METABOLIC PANEL
AST: 15 IU/L (ref 0–40)
Albumin/Globulin Ratio: 2.3 — ABNORMAL HIGH (ref 1.2–2.2)
Albumin: 4.6 g/dL (ref 3.9–4.9)
Alkaline Phosphatase: 75 IU/L (ref 44–121)
CO2: 23 mmol/L (ref 20–29)
Chloride: 100 mmol/L (ref 96–106)
Sodium: 137 mmol/L (ref 134–144)
Total Protein: 6.6 g/dL (ref 6.0–8.5)

## 2022-08-11 LAB — IRON,TIBC AND FERRITIN PANEL
Iron Saturation: 17 % (ref 15–55)
Iron: 46 ug/dL (ref 27–159)

## 2022-08-15 LAB — VITAMIN D 25 HYDROXY (VIT D DEFICIENCY, FRACTURES): Vit D, 25-Hydroxy: 19.5 ng/mL — ABNORMAL LOW (ref 30.0–100.0)

## 2022-08-15 LAB — COMPREHENSIVE METABOLIC PANEL
ALT: 13 IU/L (ref 0–32)
BUN/Creatinine Ratio: 18 (ref 9–23)
BUN: 13 mg/dL (ref 6–24)
Bilirubin Total: 0.5 mg/dL (ref 0.0–1.2)
Calcium: 9.5 mg/dL (ref 8.7–10.2)
Creatinine, Ser: 0.73 mg/dL (ref 0.57–1.00)
Globulin, Total: 2 g/dL (ref 1.5–4.5)
Glucose: 82 mg/dL (ref 70–99)
Potassium: 4.4 mmol/L (ref 3.5–5.2)
eGFR: 104 mL/min/{1.73_m2} (ref >59)

## 2022-08-15 LAB — CBC WITH DIFFERENTIAL/PLATELET
Basophils Absolute: 0 10*3/uL (ref 0.0–0.2)
Eos: 1 %
Hemoglobin: 13.9 g/dL (ref 11.1–15.9)
Immature Grans (Abs): 0 10*3/uL (ref 0.0–0.1)
Immature Granulocytes: 0 %
Lymphocytes Absolute: 2.6 10*3/uL (ref 0.7–3.1)
Lymphs: 35 %
MCH: 30.1 pg (ref 26.6–33.0)
MCV: 89 fL (ref 79–97)
Monocytes: 7 %
Neutrophils Absolute: 4.1 10*3/uL (ref 1.4–7.0)
Neutrophils: 56 %
Platelets: 256 10*3/uL (ref 150–450)
WBC: 7.3 10*3/uL (ref 3.4–10.8)

## 2022-08-15 LAB — INSULIN, FREE AND TOTAL
Free Insulin: 4.7 uU/mL
Total Insulin: 4.7 uU/mL

## 2022-08-15 LAB — IRON,TIBC AND FERRITIN PANEL
Ferritin: 147 ng/mL (ref 15–150)
Total Iron Binding Capacity: 267 ug/dL (ref 250–450)
UIBC: 221 ug/dL (ref 131–425)

## 2022-08-17 ENCOUNTER — Other Ambulatory Visit: Payer: Self-pay | Admitting: Nurse Practitioner

## 2022-08-17 DIAGNOSIS — E559 Vitamin D deficiency, unspecified: Secondary | ICD-10-CM

## 2022-08-17 MED ORDER — VITAMIN D3 1.25 MG (50000 UT) PO TABS
1.0000 | ORAL_TABLET | ORAL | 3 refills | Status: DC
Start: 2022-08-17 — End: 2024-01-25

## 2022-08-17 NOTE — Assessment & Plan Note (Signed)
[name] has had a 20-pound weight loss since starting Wegovy. Unfortunately, this medication is no longer covered by her health insurance plan. Given her risk of chronic conditions and due to family history of personal history, I do feel that continuation of the medication would be vitally important for proactive approach to maintaining her health. She has tried phentermine in the past, but this was only minimally effective and not a long term option. The cost of GLP-1 medications is prohibitive for her, even with coupon discounts. We could consider trial of oral medications such as Osymia or Contrave. At this time, I feel getting labs to determine current state may be the best option. If she is found to have elevated A1c, we can consider transition to Ozempic or Mounjaro. We will monitor insulin levels for possible insulin resistance presence. If this is present, I feel that advocating for Ozempic would be in her best interest.  Plan: - Monitor labs today - Continue with what you have left of the [facility]. You can start to space this apart by 1.5-2 weeks to see if you have continued success.  - If we are able to determine insulin resistance, we may be able to get approval for Ozempic.

## 2022-08-20 ENCOUNTER — Encounter: Payer: Self-pay | Admitting: Nurse Practitioner

## 2022-08-20 ENCOUNTER — Ambulatory Visit: Payer: BC Managed Care – PPO | Admitting: Nurse Practitioner

## 2022-08-20 VITALS — BP 128/76 | HR 76 | Wt 214.0 lb

## 2022-08-20 DIAGNOSIS — R350 Frequency of micturition: Secondary | ICD-10-CM

## 2022-08-20 DIAGNOSIS — K645 Perianal venous thrombosis: Secondary | ICD-10-CM | POA: Diagnosis not present

## 2022-08-20 LAB — POCT URINALYSIS DIP (CLINITEK)
Bilirubin, UA: NEGATIVE
Glucose, UA: NEGATIVE mg/dL
Leukocytes, UA: NEGATIVE
Nitrite, UA: NEGATIVE
POC PROTEIN,UA: NEGATIVE
Spec Grav, UA: 1.01 (ref 1.010–1.025)
Urobilinogen, UA: 0.2 E.U./dL
pH, UA: 6 (ref 5.0–8.0)

## 2022-08-20 MED ORDER — TRAMADOL HCL 50 MG PO TABS
50.0000 mg | ORAL_TABLET | Freq: Four times a day (QID) | ORAL | 0 refills | Status: AC | PRN
Start: 2022-08-20 — End: 2022-08-25

## 2022-08-20 NOTE — Progress Notes (Signed)
[name], DNP, AGNP-c [facility] [address] [place], [place] [address] [phone]  Subjective:   [name] is a 47 y.o. female presents to day for evaluation of: Thrombosed hemorrhoid [name] presents today for evaluation of thrombosed external hemorrhoid that has been present for the past several days. She reports the hemorrhoid cream she has used in the past was not effective and the pain is "severe" in nature with difficulty standing, sitting, and having a bowel movement. She also reports it feels that the hemorrhoid is putting pressure on her bladder and she can feel it extend into the perineum.   PMH, Medications, and Allergies reviewed and updated in chart as appropriate.   ROS negative except for what is listed in HPI. Objective:  BP 128/76   Pulse 76   Wt 214 lb (97.1 kg)   BMI 37.91 kg/m  Physical Exam Vitals and nursing note reviewed.  Constitutional:      General: She is not in acute distress.    Appearance: Normal appearance.  HENT:     Head: Normocephalic.  Eyes:     Pupils: Pupils are equal, round, and reactive to light.  Cardiovascular:     Rate and Rhythm: Normal rate.  Pulmonary:     Effort: Pulmonary effort is normal.  Genitourinary:   Musculoskeletal:        General: Normal range of motion.  Skin:    General: Skin is warm and dry.     Capillary Refill: Capillary refill takes less than 2 seconds.  Neurological:     General: No focal deficit present.     Mental Status: She is alert and oriented to person, place, and time.  Psychiatric:        Mood and Affect: Mood normal.           Assessment & Plan:   Problem List Items Addressed This Visit     Thrombosed external hemorrhoid - Primary    2.2cm thrombosed external hemorrhoid visualized on examination. Area is extremely painful for patient and she wishes for removal today. Verbal consent obtained and recommendations discussed. Given the size, the option to  remove the excess skin was chosen to help with comfort and prevention of recurrence.  Area prepped with gluteus taped open to allow for access. Patient in side lying position. Area cleansed with betadine and block performed with 1% lidocaine with epinephrine. Once block confirmed, in sterile fashion, hemostats utilized to clamp the base of the hemorrhoid close to the anus just past the point of thrombosis. Scalpel utilized to trim excess skin and remove thrombosis. Hemostats held in place for 5 minutes to help with hemostasis.  Once clamp removed, small amount of bleeding present. Attempt to cauterize with electric hyfrecator was unsuccessful and small amount of bleeding persisted. I did have concerns for increased bleeding given the size of the hemorrhoid, therefore,4 absorbable sutures were placed to help reduce bleeding and allow for faster healing. Patient tolerated procedure well.  Pain management discussed. Tramadol sent to pharmacy for use up to every 6 hours to help with pain. Discussed utilization of frozen cotton balls wrapped in small piece of gauze placed at the surgical area to help reduce pain and swelling. Sitz baths may also be effective to help with pain and inflammation. Discussed with patient that sutures will dissolve over time and do not need removal, however, if they cause significant discomfort, we can elect to manually remove them after the weekend. My biggest concern was complications  over the weekend when the office was closed.       Relevant Medications   traMADol (ULTRAM) 50 MG tablet   Other Visit Diagnoses     Frequent urination       Relevant Orders   POCT URINALYSIS DIP (CLINITEK) (Completed)         [name], DNP, AGNP-c 08/23/2022  8:31 AM    History, Medications, Surgery, SDOH, and Family History reviewed and updated as appropriate.

## 2022-08-23 ENCOUNTER — Other Ambulatory Visit (HOSPITAL_BASED_OUTPATIENT_CLINIC_OR_DEPARTMENT_OTHER): Payer: Self-pay

## 2022-08-23 ENCOUNTER — Encounter: Payer: Self-pay | Admitting: Nurse Practitioner

## 2022-08-23 ENCOUNTER — Telehealth: Payer: Self-pay

## 2022-08-23 ENCOUNTER — Ambulatory Visit: Payer: BC Managed Care – PPO | Admitting: Nurse Practitioner

## 2022-08-23 ENCOUNTER — Encounter: Payer: Self-pay | Admitting: Family

## 2022-08-23 VITALS — BP 118/70 | HR 92

## 2022-08-23 DIAGNOSIS — K645 Perianal venous thrombosis: Secondary | ICD-10-CM | POA: Diagnosis not present

## 2022-08-23 MED ORDER — OXYCODONE-ACETAMINOPHEN 10-325 MG PO TABS
1.0000 | ORAL_TABLET | Freq: Three times a day (TID) | ORAL | 0 refills | Status: AC | PRN
Start: 2022-08-23 — End: 2022-08-30
  Filled 2022-08-23: qty 20, 7d supply, fill #0

## 2022-08-23 MED ORDER — CEFTRIAXONE SODIUM 1 G IJ SOLR
1.0000 g | Freq: Once | INTRAMUSCULAR | Status: AC
Start: 2022-08-23 — End: 2022-08-23
  Administered 2022-08-23: 1 g via INTRAMUSCULAR

## 2022-08-23 MED ORDER — LIDOCAINE 5 % EX OINT
1.0000 | TOPICAL_OINTMENT | Freq: Three times a day (TID) | CUTANEOUS | 0 refills | Status: DC
Start: 2022-08-23 — End: 2023-02-15
  Filled 2022-08-23: qty 35.44, 12d supply, fill #0

## 2022-08-23 NOTE — Assessment & Plan Note (Addendum)
2.2cm thrombosed external hemorrhoid visualized on examination. Area is extremely painful for patient and she wishes for removal today. Verbal consent obtained and recommendations discussed. Given the size, the option to remove the excess skin was chosen to help with comfort and prevention of recurrence.  Area prepped with gluteus taped open to allow for access. Patient in side lying position. Area cleansed with betadine and block performed with 1% lidocaine with epinephrine. Once block confirmed, in sterile fashion, hemostats utilized to clamp the base of the hemorrhoid close to the anus just past the point of thrombosis. Scalpel utilized to trim excess skin and remove thrombosis. Hemostats held in place for 5 minutes to help with hemostasis.  Once clamp removed, small amount of bleeding present. Attempt to cauterize with electric hyfrecator was unsuccessful and small amount of bleeding persisted. I did have concerns for increased bleeding given the size of the hemorrhoid, therefore,4 absorbable sutures were placed to help reduce bleeding and allow for faster healing. Patient tolerated procedure well.  Pain management discussed. Tramadol sent to pharmacy for use up to every 6 hours to help with pain. Discussed utilization of frozen cotton balls wrapped in small piece of gauze placed at the surgical area to help reduce pain and swelling. Sitz baths may also be effective to help with pain and inflammation. Discussed with patient that sutures will dissolve over time and do not need removal, however, if they cause significant discomfort, we can elect to manually remove them after the weekend. My biggest concern was complications over the weekend when the office was closed.

## 2022-08-23 NOTE — Telephone Encounter (Signed)
I called pt. Per your request and she stated she was in a lot of pain all weekend. She stated she just had to lay in bed all weekend. She has a lot of pain back there, seems to be hard and swollen very uncomfortable.

## 2022-08-23 NOTE — Patient Instructions (Signed)
For the next 3 days: You will notice bleeding. This should be light and should NOT saturate a gauze pad.  If large amounts of blood are noted or if you are saturating a guaze pad, please seek emergency evaluation immediately.   Perform sitz baths by sitting in a shallow tub or basin of warm water for 15 minutes 3 times a day.    Do not lift more than 10 pounds.   Do not sit directly on your bottom for more than 30 minutes at a time.   Ice packs wrapped in a washcloth or paper towel may be applied to the rectum for 20 minutes at a time. Give yourself one hour between applications. This will help with pain and swelling. You may alternatively use frozen cotton balls (soak cotton ball in water and place in freezer, wrap in gauze before placing at rectum).   After each bowel movement, rinse the outside of your anus well with a warm bottle of water. You can poke holes in the lid of a disposable water bottle and fill with warm water to do this.  Do not wipe hard, but only pat the bottom dry with toilet tissue.   It can take up to one full week before all tenderness is resolved.   If at anytime you notice pus, excessive bleeding, start to have a fever/chills, or your pain is severe please seek emergency evaluation.

## 2022-08-24 ENCOUNTER — Other Ambulatory Visit (HOSPITAL_BASED_OUTPATIENT_CLINIC_OR_DEPARTMENT_OTHER): Payer: Self-pay

## 2022-08-24 ENCOUNTER — Ambulatory Visit: Payer: BC Managed Care – PPO | Admitting: Nurse Practitioner

## 2022-08-24 ENCOUNTER — Encounter: Payer: Self-pay | Admitting: Nurse Practitioner

## 2022-08-24 VITALS — BP 120/70 | HR 75 | Ht 63.0 in | Wt 208.0 lb

## 2022-08-24 DIAGNOSIS — K645 Perianal venous thrombosis: Secondary | ICD-10-CM | POA: Diagnosis not present

## 2022-08-24 DIAGNOSIS — K6289 Other specified diseases of anus and rectum: Secondary | ICD-10-CM | POA: Diagnosis not present

## 2022-08-24 MED ORDER — DIAZEPAM 2 MG PO TABS
2.0000 mg | ORAL_TABLET | Freq: Two times a day (BID) | ORAL | 0 refills | Status: DC | PRN
  Filled 2022-08-24: qty 6, 3d supply, fill #0

## 2022-08-24 NOTE — Progress Notes (Signed)
08/24/2022 [name] [id number] January 12, 1976   Chief Complaint: Rectal pain, thrombosed hemorrhoid   History of Present Illness: [name] is a 47 year old female with a past medical history of iron deficiency anemia secondary to dysfunctional uterine  bleeding s/p ablation, remote GERD and diverticulosis. S/P cholecystectomy 2004. S/P C sections in 2007 and 2010. She presents today for further evaluation regarding severe rectal pain. She developed abrupt rectal pain on Thursday 08/19/2022. She was seen by her PCP on Friday 08/20/2022 and she was diagnosed with a thrombosed external hemorrhoid which was excised in office. She had active bleeding despite attempted cautery and 4 sutures were placed with successful hemostasis. She continued to have rectal pain and swelling post procedure. She was seen by her PCP yesterday and the sutures were removed and she received Ceftriaxone 1gm IM and she was prescribed topical Lidocaine 5% and Oxycodone/Acetaminophen 10/325mg  Q 8 hrs PRN.   She presents to our office today with complaints of severe rectal pain. She cannot sit down due to this pain. Her mother is present. She denies having any significant straining or constipation prior to the onset of her rectal pain 08/19/2022. She typically passes a normal soft stool everyday or every other day. Prior history of IBS. She stated her rectal pain and swelling is much worse today. No significant rectal bleeding and no notable anorectal discharged.   She underwent a colonoscopy 08/31/2021 which identified one 6mm tubular adenomatous polyp removed from the sigmoid colon.      Latest Ref Rng & Units 08/10/2022    4:34 PM 01/28/2022    9:40 AM 07/21/2021    2:57 PM  CBC  WBC 3.4 - 10.8 x10E3/uL 7.3  7.0  5.4   Hemoglobin 11.1 - 15.9 g/dL 40.9  81.1  91.4   Hematocrit 34.0 - 46.6 % 41.0  42.2  40.7   Platelets 150 - 450 x10E3/uL 256  303  245.0        Latest Ref Rng & Units 08/10/2022    4:34 PM  01/28/2022    9:40 AM 07/21/2021    2:57 PM  CMP  Glucose 70 - 99 mg/dL 82  90  782   BUN 6 - 24 mg/dL 13  13  14    Creatinine 0.57 - 1.00 mg/dL 9.56  2.13  0.86   Sodium 134 - 144 mmol/L 137  138  138   Potassium 3.5 - 5.2 mmol/L 4.4  4.7  3.8   Chloride 96 - 106 mmol/L 100  102  105   CO2 20 - 29 mmol/L 23  21  25    Calcium 8.7 - 10.2 mg/dL 9.5  9.5  8.9   Total Protein 6.0 - 8.5 g/dL 6.6  6.7  6.8   Total Bilirubin 0.0 - 1.2 mg/dL 0.5  0.4  0.2   Alkaline Phos 44 - 121 IU/L 75  72  72   AST 0 - 40 IU/L 15  13  14    ALT 0 - 32 IU/L 13  12  10      Intrinsic factor antibody negative, B12 level 288, TTG IgA and TTG IgG < 1 with IGA level 154. on 07/21/2021  Colonoscopy 08/31/2021: - The examined portion of the ileum was normal.  - One 6 mm polyp in the sigmoid colon, removed with a cold snare. Resected and retrieved.  - The examination was otherwise normal on direct and retroflexion views. - 5 Year recall colonoscopy  TUBULAR  ADENOMA WITHOUT HIGH GRADE DYSPLASIA.  Colonoscopy report performed on 10/18/2002 by Sam Tice: showed diverticulosis from transverse to sigmoid colon and mild tortuosity.  Upper endoscopy 05/07/2002 by Sheryn Bison: showed LA grade a esophagitis.  Past Medical History:  Diagnosis Date   Abnormal uterine bleeding due to adenomyosis 03/16/2018   Allergic rhinitis    CAP (community acquired pneumonia) 07/20/2013   Cervical spondylosis    Right radiculopathy->MRI confirmed right sided C7 compression 11/13/19   Diverticulosis    H/O LEEP    1999 and 2000.  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 Symptomatic : fatigue and hair loss.  Intol of oral iron.  Hematology saw her 12/25/15 and did feraheme x 2 doses.  Pt's symptoms abated nicely with this. She has avg'd 2  doses of IV iron per year (as of 11/2018).   Menorrhagia    pelvic ultrasound 02/2018-->possible adenomyosis per GYN.  Ongoing GYN mgmt/considering ablation as of 03/2019.   Menorrhagia with regular cycle 12/31/2015   Need for prophylactic vaccination and inoculation against influenza 01/28/2016   Obesity, Class II, BMI 35-39.9 09/29/2007   Qualifier: Diagnosis of  By: Nena Jordan    Ocular migraine    Plantar fasciitis of left foot 07/12/2012   TMJ pain dysfunction syndrome 2015   (causing otalgia/referred pain)-ENT is Dr. Jenne Pane   Vitamin D deficiency       Latest Ref Rng & Units 08/10/2022    4:34 PM 01/28/2022    9:40 AM 07/21/2021    2:57 PM  CMP  Glucose 70 - 99 mg/dL 82  90  161   BUN 6 - 24 mg/dL 13  13  14    Creatinine 0.57 - 1.00 mg/dL 0.96  0.45  4.09   Sodium 134 - 144 mmol/L 137  138  138   Potassium 3.5 - 5.2 mmol/L 4.4  4.7  3.8   Chloride 96 - 106 mmol/L 100  102  105   CO2 20 - 29 mmol/L 23  21  25    Calcium 8.7 - 10.2 mg/dL 9.5  9.5  8.9   Total Protein 6.0 - 8.5 g/dL 6.6  6.7  6.8   Total Bilirubin 0.0 - 1.2 mg/dL 0.5  0.4  0.2   Alkaline Phos 44 - 121 IU/L 75  72  72   AST 0 - 40 IU/L 15  13  14    ALT 0 - 32 IU/L 13  12  10       Current Outpatient Medications on File Prior to Visit  Medication Sig Dispense Refill   Cholecalciferol (VITAMIN D3) 1.25 MG (50000 UT) TABS Take 1 tablet by mouth once a week. 12 tablet 3   lidocaine (XYLOCAINE) 5 % ointment Apply 1 Application topically 3 (three) times daily. 35.44 g 0   oxyCODONE-acetaminophen (PERCOCET) 10-325 MG tablet Take 1 tablet by mouth every 8 (eight) hours as needed for up to 7 days for pain. 20 tablet 0   traMADol (ULTRAM) 50 MG tablet Take 1-2 tablets (50-100 mg total) by mouth every 6 (six) hours as needed for up to 5 days. 30 tablet 0   No current facility-administered medications on file prior to visit.   Allergies  Allergen Reactions   Sulfa Antibiotics Hives   Current  Medications,  Allergies, Past Medical History, Past Surgical History, Family History and Social History were reviewed in [employer] record.  Review of Systems:   Constitutional: Negative for fever, sweats, chills or weight loss.  Respiratory: Negative for shortness of breath.   Cardiovascular: Negative for chest pain, palpitations and leg swelling.  Gastrointestinal: See HPI.  Musculoskeletal: Negative for back pain or muscle aches.  Neurological: Negative for dizziness, headaches or paresthesias.    Physical Exam: BP 120/70   Pulse 75   Ht 5\' 3"  (1.6 m)   Wt 208 lb (94.3 kg)   BMI 36.85 kg/m   General: 47 year old female, in tears secondary to rectal pain resting on right side on exam table.  Head: Normocephalic and atraumatic. Eyes: No scleral icterus. Conjunctiva pink . Ears: Normal auditory acuity. Mouth: Dentition intact. No ulcers or lesions.  Lungs: Clear throughout to auscultation. Heart: Regular rate and rhythm, no murmur. Abdomen: Soft, nontender and nondistended. No masses or hepatomegaly. Normal bowel sounds x 4 quadrants.  Rectum: External hemorrhoid s/p excision with moderate soft tissue edema, oozing a small amount of red blood and a scant amount of yellow translucent drainage significant tenderness on exam.  Internal rectal exam deferred secondary to pain. Patient provided verbal consent for photo. CMA DD present during exam.    Musculoskeletal: Symmetrical with no gross deformities. Extremities: No edema. Neurological: Alert oriented x 4. No focal deficits.  Psychological: Alert and cooperative. Normal mood and affect  Assessment and Recommendations:  47 year old female with acute onset anorectal pain secondary to a thrombosed hemorrhoid s/p excision/thrombectomy with suture placement per PCP 08/20/2022. Patient developed worsening anorectal pain and sutures were removed on 08/23/2022, treated with Ceftriaxone IM x 1  and Percocet. Patient presents today  with significant pain and swelling to the external anal thrombectomy site.  -I contacted general surgery, appointment scheduled with colorectal surgery PA-C today at 3:30pm -Sitz bath 10 minutes twice daily -MiraLAX once or twice daily as needed, avoid passing hard stools/straining -Limit Percocet as this medication will increase the risk of constipation -Ibuprofen 800 mg 1 p.o. every 8 hours as needed, take with food -Work excuse 4/26 - 08/25/2022 provided, will extend if needed -Await further recommendations from colorectal surgery -Patient instructed to the emergency room if she develops worsening anorectal pain/distress  Colon polyps.  Colonoscopy 08/31/2021 identified one 6 mm tubular adenomatous polyp removed from the sigmoid colon. -Next colonoscopy due 08/2026

## 2022-08-24 NOTE — Patient Instructions (Signed)
Take Ibuprofen 800 mg one by mouth every 8 hours as needed with food  Sitz bath- twice daily  Miralax- every night as needed & may increase to twice daily  Proceed with colorectal surgeon appointment today at 3:30 pm.  Due to recent changes in healthcare laws, you may see the results of your imaging and laboratory studies on MyChart before your provider has had a chance to review them.  We understand that in some cases there may be results that are confusing or concerning to you. Not all laboratory results come back in the same time frame and the provider may be waiting for multiple results in order to interpret others.  Please give [place] 48 hours in order for your provider to thoroughly review all the results before contacting the office for clarification of your results.   Thank you for trusting me with your gastrointestinal care!   Alcide Evener, CRNP

## 2022-08-24 NOTE — Progress Notes (Signed)
Addendum: Reviewed and agree with assessment and management plan. Rylie Limburg M, MD  

## 2022-08-24 NOTE — Progress Notes (Signed)
08/24/2022 [name] [id number] 11/15/1975   Chief Complaint: Rectal pain, thrombosed hemorrhoid   History of Present Illness: [name]. Horn is a 47 year old female with a past medical history of iron deficiency anemia secondary to dysfunctional uterine  bleeding s/p ablation, remote GERD and diverticulosis. S/P cholecystectomy 2004. S/P C sections in 2007 and 2010. She presents today for further evaluation regarding severe rectal pain. She developed abrupt rectal pain on Thursday 08/19/2022. She was seen by her PCP on Friday 08/20/2022 and she was diagnosed with a thrombosed external hemorrhoid which was excised in office. She had active bleeding despite attempted cautery and 4 sutures were placed with successful hemostasis. She continued to have rectal pain and swelling post procedure. She was seen by her PCP yesterday and the sutures were removed and she received Ceftriaxone 1gm IM and she was prescribed topical Lidocaine 5% and Oxycodone/Acetaminophen 10/325mg  Q 8 hrs PRN.   She presents to our office today with complaints of severe rectal pain. She cannot sit down due to this pain. Her mother is present. She denies having any significant straining or constipation prior to the onset of her rectal pain 08/19/2022. She typically passes a normal soft stool everyday or every other day. Prior history of IBS. She stated her rectal pain and swelling is much worse today. No significant rectal bleeding and no notable anorectal discharged.   She underwent a colonoscopy 08/31/2021 which identified one 6mm tubular adenomatous polyp removed from the sigmoid colon.      Latest Ref Rng & Units 08/10/2022    4:34 PM 01/28/2022    9:40 AM 07/21/2021    2:57 PM  CBC  WBC 3.4 - 10.8 x10E3/uL 7.3  7.0  5.4   Hemoglobin 11.1 - 15.9 g/dL 78.2  95.6  21.3   Hematocrit 34.0 - 46.6 % 41.0  42.2  40.7   Platelets 150 - 450 x10E3/uL 256  303  245.0        Latest Ref Rng & Units 08/10/2022    4:34 PM  01/28/2022    9:40 AM 07/21/2021    2:57 PM  CMP  Glucose 70 - 99 mg/dL 82  90  086   BUN 6 - 24 mg/dL 13  13  14    Creatinine 0.57 - 1.00 mg/dL 5.78  4.69  6.29   Sodium 134 - 144 mmol/L 137  138  138   Potassium 3.5 - 5.2 mmol/L 4.4  4.7  3.8   Chloride 96 - 106 mmol/L 100  102  105   CO2 20 - 29 mmol/L 23  21  25    Calcium 8.7 - 10.2 mg/dL 9.5  9.5  8.9   Total Protein 6.0 - 8.5 g/dL 6.6  6.7  6.8   Total Bilirubin 0.0 - 1.2 mg/dL 0.5  0.4  0.2   Alkaline Phos 44 - 121 IU/L 75  72  72   AST 0 - 40 IU/L 15  13  14    ALT 0 - 32 IU/L 13  12  10      Intrinsic factor antibody negative, B12 level 288, TTG IgA and TTG IgG < 1 with IGA level 154. on 07/21/2021  Colonoscopy 08/31/2021: - The examined portion of the ileum was normal.  - One 6 mm polyp in the sigmoid colon, removed with a cold snare. Resected and retrieved.  - The examination was otherwise normal on direct and retroflexion views. - 5 Year recall colonoscopy  TUBULAR  ADENOMA WITHOUT HIGH GRADE DYSPLASIA.  Colonoscopy report performed on 10/18/2002 by Sam Rosendale: showed diverticulosis from transverse to sigmoid colon and mild tortuosity.  Upper endoscopy 05/07/2002 by Sheryn Bison: showed LA grade a esophagitis.  Past Medical History:  Diagnosis Date   Abnormal uterine bleeding due to adenomyosis 03/16/2018   Allergic rhinitis    CAP (community acquired pneumonia) 07/20/2013   Cervical spondylosis    Right radiculopathy->MRI confirmed right sided C7 compression 11/13/19   Diverticulosis    H/O LEEP    1999 and 2000.  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 Symptomatic : fatigue and hair loss.  Intol of oral iron.  Hematology saw her 12/25/15 and did feraheme x 2 doses.  Pt's symptoms abated nicely with this. She has avg'd 2  doses of IV iron per year (as of 11/2018).   Menorrhagia    pelvic ultrasound 02/2018-->possible adenomyosis per GYN.  Ongoing GYN mgmt/considering ablation as of 03/2019.   Menorrhagia with regular cycle 12/31/2015   Need for prophylactic vaccination and inoculation against influenza 01/28/2016   Obesity, Class II, BMI 35-39.9 09/29/2007   Qualifier: Diagnosis of  By: Nena Jordan    Ocular migraine    Plantar fasciitis of left foot 07/12/2012   TMJ pain dysfunction syndrome 2015   (causing otalgia/referred pain)-ENT is Dr. Jenne Pane   Vitamin D deficiency       Latest Ref Rng & Units 08/10/2022    4:34 PM 01/28/2022    9:40 AM 07/21/2021    2:57 PM  CMP  Glucose 70 - 99 mg/dL 82  90  960   BUN 6 - 24 mg/dL 13  13  14    Creatinine 0.57 - 1.00 mg/dL 4.54  0.98  1.19   Sodium 134 - 144 mmol/L 137  138  138   Potassium 3.5 - 5.2 mmol/L 4.4  4.7  3.8   Chloride 96 - 106 mmol/L 100  102  105   CO2 20 - 29 mmol/L 23  21  25    Calcium 8.7 - 10.2 mg/dL 9.5  9.5  8.9   Total Protein 6.0 - 8.5 g/dL 6.6  6.7  6.8   Total Bilirubin 0.0 - 1.2 mg/dL 0.5  0.4  0.2   Alkaline Phos 44 - 121 IU/L 75  72  72   AST 0 - 40 IU/L 15  13  14    ALT 0 - 32 IU/L 13  12  10       Current Outpatient Medications on File Prior to Visit  Medication Sig Dispense Refill   Cholecalciferol (VITAMIN D3) 1.25 MG (50000 UT) TABS Take 1 tablet by mouth once a week. 12 tablet 3   lidocaine (XYLOCAINE) 5 % ointment Apply 1 Application topically 3 (three) times daily. 35.44 g 0   oxyCODONE-acetaminophen (PERCOCET) 10-325 MG tablet Take 1 tablet by mouth every 8 (eight) hours as needed for up to 7 days for pain. 20 tablet 0   traMADol (ULTRAM) 50 MG tablet Take 1-2 tablets (50-100 mg total) by mouth every 6 (six) hours as needed for up to 5 days. 30 tablet 0   No current facility-administered medications on file prior to visit.   Allergies  Allergen Reactions   Sulfa Antibiotics Hives   Current  Medications,  Allergies, Past Medical History, Past Surgical History, Family History and Social History were reviewed in [employer] record.  Review of Systems:   Constitutional: Negative for fever, sweats, chills or weight loss.  Respiratory: Negative for shortness of breath.   Cardiovascular: Negative for chest pain, palpitations and leg swelling.  Gastrointestinal: See HPI.  Musculoskeletal: Negative for back pain or muscle aches.  Neurological: Negative for dizziness, headaches or paresthesias.    Physical Exam: BP 120/70   Pulse 75   Ht 5\' 3"  (1.6 m)   Wt 208 lb (94.3 kg)   BMI 36.85 kg/m   General: 47 year old female, in tears secondary to rectal pain resting on right side on exam table.  Head: Normocephalic and atraumatic. Eyes: No scleral icterus. Conjunctiva pink . Ears: Normal auditory acuity. Mouth: Dentition intact. No ulcers or lesions.  Lungs: Clear throughout to auscultation. Heart: Regular rate and rhythm, no murmur. Abdomen: Soft, nontender and nondistended. No masses or hepatomegaly. Normal bowel sounds x 4 quadrants.  Rectum: External hemorrhoid s/p excision with moderate soft tissue edema, oozing a small amount of red blood and a scant amount of yellow translucent drainage significant tenderness on exam.  Internal rectal exam deferred secondary to pain. Patient provided verbal consent for photo. CMA DD present during exam.    Musculoskeletal: Symmetrical with no gross deformities. Extremities: No edema. Neurological: Alert oriented x 4. No focal deficits.  Psychological: Alert and cooperative. Normal mood and affect  Assessment and Recommendations:  47 year old female with acute onset anorectal pain secondary to a thrombosed hemorrhoid s/p excision/thrombectomy with suture placement per PCP 08/20/2022. Patient developed worsening anorectal pain and sutures were removed on 08/23/2022, treated with Ceftriaxone IM x 1  and Percocet. Patient presents today  with significant pain and swelling to the external anal thrombectomy site.  -I contacted general surgery, appointment scheduled with colorectal surgery PA-C today at 3:30pm -Sitz bath 10 minutes twice daily -MiraLAX once or twice daily as needed, avoid passing hard stools/straining -Limit Percocet as this medication will increase the risk of constipation -Ibuprofen 800 mg 1 p.o. every 8 hours as needed, take with food -Work excuse 4/26 - 08/25/2022 provided, will extend if needed -Await further recommendations from colorectal surgery  Colon polyps.  Colonoscopy 08/31/2021 identified one 6 mm tubular adenomatous polyp removed from the sigmoid colon. -Next colonoscopy due 08/2026

## 2022-08-26 ENCOUNTER — Ambulatory Visit: Payer: BC Managed Care – PPO | Admitting: Nurse Practitioner

## 2022-08-26 ENCOUNTER — Telehealth: Payer: Self-pay | Admitting: Nurse Practitioner

## 2022-08-26 NOTE — Telephone Encounter (Signed)
Work excuse sent to patient via MyChart

## 2022-08-26 NOTE — Telephone Encounter (Signed)
I called patient for update. Refer to office visit 08/24/2022. She was seen by colorectal surgery on 4/30 who advised warm soaks in the bathtub and not a sitz bath (sitz bath can add more pressure to the anorectal area) and to apply ice packs for 10 - 15 min PRN. No need for surgical intervention. She stated her anorectal pain is about the same but definitely not any worse. She is taking MiraLAX twice a day and passing bowel movements without distress.  She is no longer taking Percocet.  She was prescribed Valium by colorectal surgery with strict instructions not to take it if she was taking Percocet. She stated she did not wish to take Valium or Percocet at this point.  She will follow-up with colorectal surgery in one week as planned.  I will provide an extended work excuse throughout the rest of this week. 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   DD, pls send patient an updated work excuse for today and Friday 5/3 with return to work on Monday 5/6. THX.

## 2022-08-30 ENCOUNTER — Encounter: Payer: Self-pay | Admitting: Nurse Practitioner

## 2022-08-30 NOTE — Progress Notes (Signed)
Jacqueline Eth, DNP, AGNP-c [facility] [address] [place], [place] [address] [phone]  Subjective:   [name] is a 47 y.o. female presents to day for evaluation of: Thrombosed Hemorrhoid, post removal [name] presented on Friday for removal of thrombosed hemorrhoid due to pain and discomfort. Hemorrhoid removal was completed and 4 absorbable sutures placed to assist with hemostasis. Today, she reports that since the procedure, she has been experiencing significant pain and has noted a "giant, hard lump" where the hemorrhoid was removed.  She tells me she has had no relief despite using tramadol, sitz bath's, ibuprofen, and ice.  She describes the entire area is problematic, not just the area of sutures.  [name] tells me that she spent the entire weekend laying on her stomach due to discomfort and she is still unable to sit comfortably.  She denies any fever, chills, nausea, vomiting.  She does mention feeling weak and having pain during bowel movement despite taking MiraLAX.  She tells me during the initial bowel movement she began to feel overheated and needed assistance from her daughter and her boyfriend to return to the bed. She has been having regular bowel movements despite the pain. She did call the on call provider over the weekend and was told to use ice on the area.   PMH, Medications, and Allergies reviewed and updated in chart as appropriate.   ROS negative except for what is listed in HPI. Objective:  BP 118/70   Pulse 92   SpO2 98%  Physical Exam Vitals and nursing note reviewed. Exam conducted with a chaperone present.  Constitutional:      Comments: Anxious and appears in pain.  Eyes:     Pupils: Pupils are equal, round, and reactive to light.  Cardiovascular:     Rate and Rhythm: Normal rate.     Pulses: Normal pulses.  Pulmonary:     Effort: Pulmonary effort is normal.  Genitourinary:    Rectum: Tenderness and external hemorrhoid present.      Comments: Appx 2.5 cm x 1 cm oval shaped mass at the rectal opening. The most posterior tip of the mass appears dark with suspected coagulated blood present. The remainder of the area appears erythematous and moist. A centralized incision is visualized with two sutures present loosely binding the incision. No drainage, odor, warmth present. Very tender to mild palpation.  Musculoskeletal:        General: Normal range of motion.     Right lower leg: No edema.     Left lower leg: No edema.  Skin:    General: Skin is warm and dry.     Capillary Refill: Capillary refill takes less than 2 seconds.  Neurological:     General: No focal deficit present.     Mental Status: She is alert and oriented to person, place, and time.     Sensory: No sensory deficit.     Motor: No weakness.     Coordination: Coordination normal.     Gait: Gait normal.  Psychiatric:        Attention and Perception: Attention normal.        Mood and Affect: Mood is anxious.        Behavior: Behavior normal. Behavior is cooperative.        Thought Content: Thought content normal.           Assessment & Plan:   Problem List Items Addressed This Visit     Thrombosed external hemorrhoid -  Primary    [name] is experiencing severe pain and discomfort to the rectal and perineal areas following recent incision and excision of thrombus of external hemorrhoid. She reports no relief despite pain medication, ice, and sitz baths. Physical examination reveals 2.5 x 1 cm mass in the perianal region, suggestive of rethrombosed hemorrhoid. The area is significantly inflamed and painful causing pressure on adjacent structures. The initial interventions appears to have been complicated by the formation of rethrombosis. There are no signs of infection present at this time. Sutures are in place, however, incision is well visualized. At this time, I feel that any additional procedure within the office may complicate the matter and would  appreciate the input from general surgery. Sutures removed, with no change in comfort. Topical lidocaine and antibacterial ointment applied to area for comfort.  General surgery contacted, but on call provider declined evaluation due to the history of procedure on Friday.  We discussed presentation to the emergency room for evaluation and management, but patient was able to get appointment with GI provider first thing in the morning. Given her level of pain, we will increase pain medication to percocet, with caution to continue miralax and increased water intake to help prevent constipation. Prophylactic dose of Rocephin provided today.  Strongly encourage her to proceed to the emergency room if her symptoms worsen through the evening before she can be seen.       Relevant Medications   oxyCODONE-acetaminophen (PERCOCET) 10-325 MG tablet   lidocaine (XYLOCAINE) 5 % ointment      Jacqueline Eth, DNP, AGNP-c 08/30/2022  7:30 PM    History, Medications, Surgery, SDOH, and Family History reviewed and updated as appropriate.

## 2022-08-30 NOTE — Assessment & Plan Note (Signed)
[name] is experiencing severe pain and discomfort to the rectal and perineal areas following recent incision and excision of thrombus of external hemorrhoid. She reports no relief despite pain medication, ice, and sitz baths. Physical examination reveals 2.5 x 1 cm mass in the perianal region, suggestive of rethrombosed hemorrhoid. The area is significantly inflamed and painful causing pressure on adjacent structures. The initial interventions appears to have been complicated by the formation of rethrombosis. There are no signs of infection present at this time. Sutures are in place, however, incision is well visualized. At this time, I feel that any additional procedure within the office may complicate the matter and would appreciate the input from general surgery. Sutures removed, with no change in comfort. Topical lidocaine and antibacterial ointment applied to area for comfort.  General surgery contacted, but on call provider declined evaluation due to the history of procedure on Friday.  We discussed presentation to the emergency room for evaluation and management, but patient was able to get appointment with GI provider first thing in the morning. Given her level of pain, we will increase pain medication to percocet, with caution to continue miralax and increased water intake to help prevent constipation. Prophylactic dose of Rocephin provided today.  Strongly encourage her to proceed to the emergency room if her symptoms worsen through the evening before she can be seen.

## 2022-09-23 ENCOUNTER — Ambulatory Visit: Payer: BC Managed Care – PPO | Admitting: Nurse Practitioner

## 2022-09-23 ENCOUNTER — Encounter: Payer: Self-pay | Admitting: Nurse Practitioner

## 2022-09-23 VITALS — BP 126/76 | HR 71 | Wt 208.8 lb

## 2022-09-23 DIAGNOSIS — Z6839 Body mass index (BMI) 39.0-39.9, adult: Secondary | ICD-10-CM

## 2022-09-23 NOTE — Progress Notes (Signed)
  Shawna Clamp, DNP, AGNP-c [facility]  [address] [place], [place] [address] [phone]  ESTABLISHED PATIENT- Chronic Health and/or Follow-Up Visit  Blood pressure 126/76, pulse 71, weight 208 lb 12.8 oz (94.7 kg).    [name] is a 47 y.o. year old female presenting today for evaluation and management of weight management.  [name] is concerned about the discontinuation of Wegovy coverage by her state health plan. She mentions her significant weight loss and the positive changes it has brought, but is uncertain about continuing the medication due to coverage issues. She is exploring patient assistance programs for Ozempic as an alternative. Her recent A1C indicated a pre-diabetic state, although she was on Wegovy at the time of labs, therefore, baseline A1c is not available. She does have a strong family history of diabetes and has known insulin resistance. She is considering applying for assistance through the Ozempic patient assistance program.   All ROS negative with exception of what is listed above.   PHYSICAL EXAM Physical Exam Vitals and nursing note reviewed.  Constitutional:      Appearance: Normal appearance. She is obese.  HENT:     Head: Normocephalic.  Eyes:     Pupils: Pupils are equal, round, and reactive to light.  Cardiovascular:     Rate and Rhythm: Normal rate and regular rhythm.     Pulses: Normal pulses.     Heart sounds: Normal heart sounds.  Pulmonary:     Effort: Pulmonary effort is normal.     Breath sounds: Normal breath sounds.  Musculoskeletal:        General: Normal range of motion.     Cervical back: Normal range of motion.  Skin:    General: Skin is warm.  Neurological:     General: No focal deficit present.     Mental Status: She is alert and oriented to person, place, and time.  Psychiatric:        Mood and Affect: Mood normal.     PLAN Problem List Items Addressed This Visit     Class 2 severe obesity  due to excess calories with serious comorbidity and body mass index (BMI) of 39.0 to 39.9 in adult [facility]) - Primary    [name] has had significant weight loss with Wegovy, but since loss of coverage with her health plan, she has been unable to continue the medication. She has a strong family history of diabetes and her recent A1c indicates a prediabetes state, despite being on Wegovy, indicating that her A1c was quite possibly in the diabetes range.  Plan: - Encourage healthy lifestyle with regular exercise and balanced diet - Monitor A1C levels periodically to assess diabetes risk - Explore enrollment in Ozempic Patient Assistance Program - Check for available samples to provide temporary coverage          Return if symptoms worsen or fail to improve.   Shawna Clamp, DNP, AGNP-c 09/23/2022  9:12 AM

## 2022-09-23 NOTE — Patient Instructions (Signed)
I will let you know about the medication and if it is available.

## 2022-10-03 ENCOUNTER — Encounter: Payer: Self-pay | Admitting: Nurse Practitioner

## 2022-10-14 ENCOUNTER — Telehealth: Payer: Self-pay

## 2022-10-14 NOTE — Progress Notes (Signed)
Sending MyChart correspondence to schedule a telephone visit to assist patient with cost of Ozempic in response to message from PCP, Enid Skeens.  Lenna Gilford, PharmD, DPLA

## 2022-10-14 NOTE — Telephone Encounter (Signed)
Can you assist this patient with Ozempic Pt Assistance?  Thanks

## 2022-10-18 ENCOUNTER — Telehealth: Payer: Self-pay

## 2022-10-18 NOTE — Assessment & Plan Note (Signed)
[name] has had significant weight loss with Wegovy, but since loss of coverage with her health plan, she has been unable to continue the medication. She has a strong family history of diabetes and her recent A1c indicates a prediabetes state, despite being on Wegovy, indicating that her A1c was quite possibly in the diabetes range.  Plan: - Encourage healthy lifestyle with regular exercise and balanced diet - Monitor A1C levels periodically to assess diabetes risk - Explore enrollment in Ozempic Patient Assistance Program - Check for available samples to provide temporary coverage

## 2022-10-19 ENCOUNTER — Other Ambulatory Visit: Payer: BC Managed Care – PPO

## 2022-10-19 NOTE — Progress Notes (Signed)
   10/19/2022  Patient ID: [name], female   DOB: 1975-07-03, 47 y.o.   MRN: [id number]  Patient outreach to discuss access/affordability of Ozempic in response to request from patient's PCP, Enid Skeens.    -Patient is enrolled in the [facility], which no longer covers GLP1's only indicated for weight loss -Reginal Lutes was prescribed last year for weight loss and borderline pre-diabetes A1c -Unable to get initially due to supply issues but did eventually obtain a 3 month supply toward the end of the year prior to insurance plan ceasing to cover -She was then given Ozempic samples from PCP office, but this supply has ran out -Patient would not qualify for PAP, only copay card that would decrease cost by $150; but medication would still be >$500/month -Educated patient that, unfortunately, unless insurance changes their formulary/coverage for these medications, they will not be covered unless patient have a diabetes diagnosis  Lenna Gilford, PharmD, DPLA

## 2022-10-19 NOTE — Progress Notes (Signed)
Error

## 2022-10-26 ENCOUNTER — Telehealth: Payer: Self-pay | Admitting: Internal Medicine

## 2022-10-26 ENCOUNTER — Other Ambulatory Visit: Payer: Self-pay | Admitting: Nurse Practitioner

## 2022-10-26 ENCOUNTER — Other Ambulatory Visit (HOSPITAL_BASED_OUTPATIENT_CLINIC_OR_DEPARTMENT_OTHER): Payer: Self-pay

## 2022-10-26 NOTE — Telephone Encounter (Signed)
Pt would like a refill on wegovy 1mg  and 1.7mg  to [facility]. Pt knows this isn't covered by insurance but will be paying out of pocket. She has tried different things and wants this sent in again

## 2022-10-27 MED ORDER — ZEPBOUND 7.5 MG/0.5ML ~~LOC~~ SOAJ
7.5000 mg | SUBCUTANEOUS | 0 refills | Status: DC
Start: 2022-10-27 — End: 2022-12-17
  Filled 2022-10-27 – 2022-11-25 (×2): qty 2, 28d supply, fill #0

## 2022-10-27 MED ORDER — WEGOVY 1.7 MG/0.75ML ~~LOC~~ SOAJ
1.7000 mg | SUBCUTANEOUS | 0 refills | Status: DC
Start: 2022-10-27 — End: 2023-02-15
  Filled 2022-10-27: qty 3, 28d supply, fill #0

## 2022-10-27 MED ORDER — ZEPBOUND 5 MG/0.5ML ~~LOC~~ SOAJ
5.0000 mg | SUBCUTANEOUS | 0 refills | Status: DC
Start: 2022-10-27 — End: 2023-02-15
  Filled 2022-10-27 – 2022-10-29 (×2): qty 2, 28d supply, fill #0

## 2022-10-27 MED ORDER — WEGOVY 1 MG/0.5ML ~~LOC~~ SOAJ
1.0000 mg | SUBCUTANEOUS | 0 refills | Status: DC
Start: 2022-10-27 — End: 2023-02-15
  Filled 2022-10-27 – 2022-10-29 (×2): qty 2, 28d supply, fill #0

## 2022-10-29 ENCOUNTER — Other Ambulatory Visit (HOSPITAL_BASED_OUTPATIENT_CLINIC_OR_DEPARTMENT_OTHER): Payer: Self-pay

## 2022-10-29 ENCOUNTER — Encounter: Payer: Self-pay | Admitting: Family

## 2022-11-26 ENCOUNTER — Other Ambulatory Visit (HOSPITAL_BASED_OUTPATIENT_CLINIC_OR_DEPARTMENT_OTHER): Payer: Self-pay

## 2022-11-30 ENCOUNTER — Ambulatory Visit: Payer: BC Managed Care – PPO | Admitting: Internal Medicine

## 2022-12-17 ENCOUNTER — Other Ambulatory Visit: Payer: Self-pay | Admitting: Nurse Practitioner

## 2022-12-20 ENCOUNTER — Other Ambulatory Visit (HOSPITAL_BASED_OUTPATIENT_CLINIC_OR_DEPARTMENT_OTHER): Payer: Self-pay

## 2022-12-20 MED ORDER — ZEPBOUND 7.5 MG/0.5ML ~~LOC~~ SOAJ
7.5000 mg | SUBCUTANEOUS | 1 refills | Status: DC
Start: 2022-12-20 — End: 2023-02-13
  Filled 2022-12-20: qty 2, 28d supply, fill #0
  Filled 2023-01-15: qty 2, 28d supply, fill #1

## 2023-01-05 ENCOUNTER — Ambulatory Visit: Payer: BC Managed Care – PPO | Admitting: Internal Medicine

## 2023-01-16 ENCOUNTER — Other Ambulatory Visit (HOSPITAL_BASED_OUTPATIENT_CLINIC_OR_DEPARTMENT_OTHER): Payer: Self-pay

## 2023-02-13 ENCOUNTER — Other Ambulatory Visit: Payer: Self-pay | Admitting: Nurse Practitioner

## 2023-02-13 DIAGNOSIS — E66812 Obesity, class 2: Secondary | ICD-10-CM

## 2023-02-14 ENCOUNTER — Other Ambulatory Visit (HOSPITAL_BASED_OUTPATIENT_CLINIC_OR_DEPARTMENT_OTHER): Payer: Self-pay

## 2023-02-14 MED ORDER — ZEPBOUND 7.5 MG/0.5ML ~~LOC~~ SOAJ
7.5000 mg | SUBCUTANEOUS | 1 refills | Status: DC
Start: 2023-02-14 — End: 2023-02-15
  Filled 2023-02-14: qty 2, 28d supply, fill #0

## 2023-02-15 ENCOUNTER — Ambulatory Visit (INDEPENDENT_AMBULATORY_CARE_PROVIDER_SITE_OTHER): Payer: BC Managed Care – PPO | Admitting: Nurse Practitioner

## 2023-02-15 ENCOUNTER — Encounter: Payer: Self-pay | Admitting: Nurse Practitioner

## 2023-02-15 ENCOUNTER — Other Ambulatory Visit (HOSPITAL_BASED_OUTPATIENT_CLINIC_OR_DEPARTMENT_OTHER): Payer: Self-pay

## 2023-02-15 VITALS — BP 124/72 | HR 70 | Wt 186.2 lb

## 2023-02-15 DIAGNOSIS — E66812 Obesity, class 2: Secondary | ICD-10-CM

## 2023-02-15 DIAGNOSIS — F900 Attention-deficit hyperactivity disorder, predominantly inattentive type: Secondary | ICD-10-CM | POA: Diagnosis not present

## 2023-02-15 DIAGNOSIS — Z6839 Body mass index (BMI) 39.0-39.9, adult: Secondary | ICD-10-CM

## 2023-02-15 MED ORDER — MOUNJARO 15 MG/0.5ML ~~LOC~~ SOAJ
15.0000 mg | SUBCUTANEOUS | 3 refills | Status: DC
  Filled 2023-02-15: qty 2, 28d supply, fill #0

## 2023-02-15 MED ORDER — AMPHETAMINE-DEXTROAMPHETAMINE 10 MG PO TABS
10.0000 mg | ORAL_TABLET | Freq: Two times a day (BID) | ORAL | 0 refills | Status: DC
  Filled 2023-02-15 – 2023-03-18 (×3): qty 60, 30d supply, fill #0

## 2023-02-15 NOTE — Progress Notes (Signed)
  Jacqueline Clamp, DNP, AGNP-c [facility]  [address] [place], [place] [address] [phone]  ESTABLISHED PATIENT- Chronic Health and/or Follow-Up Visit  Blood pressure 124/72, pulse 70, weight 186 lb 3.2 oz (84.5 kg).    [name] is a 47 y.o. year old female presenting today for evaluation and management of chronic conditions.   History of Present Illness [name] reports a significant weight loss of approximately 45-50 pounds over the past several months. This weight loss has been achieved through the use of a prescribed medication, Zepbound, which the patient has been taking since [name] in the year. The patient reports no adverse effects from the medication and has been tolerating it well. However, the patient expresses concern about the high cost of the medication and the lack of insurance coverage.    In addition to the weight concerns, the patient reports a history of Attention Deficit Disorder (ADD), diagnosed in childhood but untreated. The patient expresses frustration with an inability to focus and complete tasks, which she attributes to ADD. The patient reports that these symptoms have been worsening and are causing significant distress.   All ROS negative with exception of what is listed above.   PHYSICAL EXAM Physical Exam Vitals and nursing note reviewed.  Constitutional:      Appearance: Normal appearance.  HENT:     Head: Normocephalic.  Eyes:     Pupils: Pupils are equal, round, and reactive to light.  Cardiovascular:     Rate and Rhythm: Normal rate and regular rhythm.     Pulses: Normal pulses.     Heart sounds: Normal heart sounds.  Pulmonary:     Effort: Pulmonary effort is normal.     Breath sounds: Normal breath sounds.  Musculoskeletal:        General: Normal range of motion.     Cervical back: Normal range of motion.  Skin:    General: Skin is warm.  Neurological:     General: No focal deficit present.     Mental Status:  She is alert and oriented to person, place, and time.  Psychiatric:        Mood and Affect: Mood normal.      PLAN Problem List Items Addressed This Visit     Class 2 severe obesity due to excess calories with serious comorbidity and body mass index (BMI) of 39.0 to 39.9 in adult ([facility]CC) - Primary    Significant weight loss of 45-50 pounds with Zepbound 7.5mg . Tolerating well and considering increasing dose to 10mg . Discussed changes in medication packaging and potential impact on cost. -Increase Wegovy to 10mg  weekly if patient is comfortable. -Continue current regimen if patient prefers.       Relevant Medications   amphetamine-dextroamphetamine (ADDERALL) 10 MG tablet   Attention deficit hyperactivity disorder (ADHD), predominantly inattentive type    Patient reports difficulty with focus and completion of tasks, diagnosed in childhood but never treated. Discussed potential benefits and side effects of stimulant medications. -Start Adderall 10mg  daily, with option to split dose to 5mg  in the morning and 5mg  in the afternoon. -Check in with patient to assess response and adjust dose as needed.      Relevant Medications   amphetamine-dextroamphetamine (ADDERALL) 10 MG tablet    Return in about 6 months (around 08/16/2023).  Jacqueline Clamp, DNP, AGNP-c

## 2023-02-16 ENCOUNTER — Encounter: Payer: Self-pay | Admitting: Nurse Practitioner

## 2023-02-16 ENCOUNTER — Other Ambulatory Visit (HOSPITAL_BASED_OUTPATIENT_CLINIC_OR_DEPARTMENT_OTHER): Payer: Self-pay

## 2023-02-17 LAB — HM PAP SMEAR: HPV, high-risk: NEGATIVE

## 2023-02-19 ENCOUNTER — Other Ambulatory Visit (HOSPITAL_BASED_OUTPATIENT_CLINIC_OR_DEPARTMENT_OTHER): Payer: Self-pay

## 2023-02-19 ENCOUNTER — Encounter: Payer: Self-pay | Admitting: Nurse Practitioner

## 2023-02-19 ENCOUNTER — Encounter: Payer: Self-pay | Admitting: Family

## 2023-02-19 MED ORDER — ZEPBOUND 15 MG/0.5ML ~~LOC~~ SOAJ
15.0000 mg | SUBCUTANEOUS | 3 refills | Status: DC
  Filled 2023-02-19: qty 2, 28d supply, fill #0

## 2023-02-21 ENCOUNTER — Other Ambulatory Visit (HOSPITAL_BASED_OUTPATIENT_CLINIC_OR_DEPARTMENT_OTHER): Payer: Self-pay

## 2023-02-21 ENCOUNTER — Other Ambulatory Visit: Payer: Self-pay

## 2023-02-22 DIAGNOSIS — F900 Attention-deficit hyperactivity disorder, predominantly inattentive type: Secondary | ICD-10-CM | POA: Insufficient documentation

## 2023-02-22 NOTE — Assessment & Plan Note (Signed)
Significant weight loss of 45-50 pounds with Zepbound 7.5mg . Tolerating well and considering increasing dose to 10mg . Discussed changes in medication packaging and potential impact on cost. -Increase Wegovy to 10mg  weekly if patient is comfortable. -Continue current regimen if patient prefers.

## 2023-02-22 NOTE — Assessment & Plan Note (Signed)
Patient reports difficulty with focus and completion of tasks, diagnosed in childhood but never treated. Discussed potential benefits and side effects of stimulant medications. -Start Adderall 10mg  daily, with option to split dose to 5mg  in the morning and 5mg  in the afternoon. -Check in with patient to assess response and adjust dose as needed.

## 2023-02-23 ENCOUNTER — Other Ambulatory Visit (HOSPITAL_BASED_OUTPATIENT_CLINIC_OR_DEPARTMENT_OTHER): Payer: Self-pay

## 2023-02-23 LAB — HM MAMMOGRAPHY

## 2023-02-24 ENCOUNTER — Other Ambulatory Visit (HOSPITAL_BASED_OUTPATIENT_CLINIC_OR_DEPARTMENT_OTHER): Payer: Self-pay

## 2023-03-02 ENCOUNTER — Other Ambulatory Visit (HOSPITAL_BASED_OUTPATIENT_CLINIC_OR_DEPARTMENT_OTHER): Payer: Self-pay

## 2023-03-06 ENCOUNTER — Telehealth: Payer: Self-pay | Admitting: Nurse Practitioner

## 2023-03-06 NOTE — Telephone Encounter (Signed)
Key: [id number] PA Case ID #: [id number] Rx #: [id number] Need Help? Call us at [phone] Status sent iconSent to Plan today Drug Amphetamine-Dextroamphetamine 10MG  tablets ePA cloud Psychologist, educational Electronic PA Form

## 2023-03-18 ENCOUNTER — Other Ambulatory Visit (HOSPITAL_BASED_OUTPATIENT_CLINIC_OR_DEPARTMENT_OTHER): Payer: Self-pay

## 2023-03-26 NOTE — Telephone Encounter (Signed)
Key: [id number] PA Case ID #: [id number] Approved on November 10 by [employer] 2017 Your PA request has been approved. Additional information will be provided in the approval communication. (Message 1145) Authorization Expiration Date: 03/04/2026 Drug Amphetamine-Dextroamphetamine 10MG  tablets ePA cloud Optician, dispensing

## 2023-03-28 ENCOUNTER — Encounter: Payer: Self-pay | Admitting: Nurse Practitioner

## 2023-03-28 DIAGNOSIS — E66812 Obesity, class 2: Secondary | ICD-10-CM

## 2023-04-18 ENCOUNTER — Other Ambulatory Visit: Payer: Self-pay

## 2023-04-18 ENCOUNTER — Other Ambulatory Visit (HOSPITAL_BASED_OUTPATIENT_CLINIC_OR_DEPARTMENT_OTHER): Payer: Self-pay

## 2023-04-18 MED ORDER — ZEPBOUND 7.5 MG/0.5ML ~~LOC~~ SOAJ
7.5000 mg | SUBCUTANEOUS | 0 refills | Status: DC
  Filled 2023-04-18 (×3): qty 2, 28d supply, fill #0

## 2023-04-18 MED ORDER — ZEPBOUND 10 MG/0.5ML ~~LOC~~ SOAJ
10.0000 mg | SUBCUTANEOUS | 3 refills | Status: DC
  Filled 2023-04-18 – 2023-05-26 (×2): qty 2, 28d supply, fill #0
  Filled 2023-07-05: qty 2, 28d supply, fill #1
  Filled 2023-08-04: qty 2, 28d supply, fill #2
  Filled 2023-08-27: qty 2, 28d supply, fill #3

## 2023-04-19 ENCOUNTER — Other Ambulatory Visit: Payer: Self-pay

## 2023-04-25 ENCOUNTER — Encounter: Payer: Self-pay | Admitting: Family

## 2023-05-27 ENCOUNTER — Other Ambulatory Visit (HOSPITAL_BASED_OUTPATIENT_CLINIC_OR_DEPARTMENT_OTHER): Payer: Self-pay

## 2023-07-05 ENCOUNTER — Other Ambulatory Visit (HOSPITAL_BASED_OUTPATIENT_CLINIC_OR_DEPARTMENT_OTHER): Payer: Self-pay

## 2023-08-04 ENCOUNTER — Other Ambulatory Visit (HOSPITAL_BASED_OUTPATIENT_CLINIC_OR_DEPARTMENT_OTHER): Payer: Self-pay

## 2023-08-27 ENCOUNTER — Other Ambulatory Visit (HOSPITAL_BASED_OUTPATIENT_CLINIC_OR_DEPARTMENT_OTHER): Payer: Self-pay

## 2023-10-11 ENCOUNTER — Other Ambulatory Visit: Payer: Self-pay | Admitting: Nurse Practitioner

## 2023-10-11 ENCOUNTER — Other Ambulatory Visit (HOSPITAL_BASED_OUTPATIENT_CLINIC_OR_DEPARTMENT_OTHER): Payer: Self-pay

## 2023-10-11 ENCOUNTER — Encounter: Payer: Self-pay | Admitting: Family

## 2023-10-11 DIAGNOSIS — E66812 Obesity, class 2: Secondary | ICD-10-CM

## 2023-10-11 MED ORDER — ZEPBOUND 10 MG/0.5ML ~~LOC~~ SOAJ
10.0000 mg | SUBCUTANEOUS | 0 refills | Status: AC
  Filled 2023-10-11 – 2024-03-02 (×2): qty 2, 28d supply, fill #0

## 2023-10-21 ENCOUNTER — Other Ambulatory Visit (HOSPITAL_BASED_OUTPATIENT_CLINIC_OR_DEPARTMENT_OTHER): Payer: Self-pay

## 2023-12-09 ENCOUNTER — Ambulatory Visit: Admitting: Nurse Practitioner

## 2023-12-09 ENCOUNTER — Encounter: Payer: Self-pay | Admitting: Family

## 2023-12-09 ENCOUNTER — Encounter: Payer: Self-pay | Admitting: Nurse Practitioner

## 2023-12-09 ENCOUNTER — Other Ambulatory Visit: Payer: Self-pay | Admitting: Nurse Practitioner

## 2023-12-09 ENCOUNTER — Other Ambulatory Visit (HOSPITAL_BASED_OUTPATIENT_CLINIC_OR_DEPARTMENT_OTHER): Payer: Self-pay

## 2023-12-09 VITALS — BP 120/74 | HR 80 | Wt 144.2 lb

## 2023-12-09 DIAGNOSIS — F900 Attention-deficit hyperactivity disorder, predominantly inattentive type: Secondary | ICD-10-CM | POA: Diagnosis not present

## 2023-12-09 DIAGNOSIS — R1011 Right upper quadrant pain: Secondary | ICD-10-CM

## 2023-12-09 DIAGNOSIS — Z6839 Body mass index (BMI) 39.0-39.9, adult: Secondary | ICD-10-CM

## 2023-12-09 DIAGNOSIS — Z800 Family history of malignant neoplasm of digestive organs: Secondary | ICD-10-CM

## 2023-12-09 DIAGNOSIS — E66812 Obesity, class 2: Secondary | ICD-10-CM

## 2023-12-09 DIAGNOSIS — F329 Major depressive disorder, single episode, unspecified: Secondary | ICD-10-CM | POA: Diagnosis not present

## 2023-12-09 MED ORDER — SERTRALINE HCL 50 MG PO TABS: 50.0000 mg | ORAL_TABLET | Freq: Every day | ORAL | 3 refills | Status: DC

## 2023-12-09 MED ORDER — SERTRALINE HCL 50 MG PO TABS
50.0000 mg | ORAL_TABLET | Freq: Every day | ORAL | 3 refills | Status: DC
  Filled 2023-12-09: qty 30, 30d supply, fill #0

## 2023-12-09 NOTE — Progress Notes (Signed)
 [name], DNP, AGNP-c [facility] [address] [place], [place] [address] Main Office [phone] VISIT TYPE: Chronic Health on 12/09/2023 Today's Vitals   12/09/23 0915  BP: 120/74  Pulse: 80  Weight: 144 lb 3.2 oz (65.4 kg)   Body mass index is 25.54 kg/m. BP 120/74   Pulse 80   Wt 144 lb 3.2 oz (65.4 kg)   BMI 25.54 kg/m   Subjective:    Patient ID: [name], female    DOB: 1975-11-30, 48 y.o.   MRN: [id number]  HPI: History of Present Illness [name] is a 48 year old female who presents for a routine follow-up. She also has concerns about her father's health and her own anxiety and stress.  She is experiencing significant emotional distress due to her father's declining health.  She is concerned about her own health, expressing anxiety about potential hereditary cancer risks, given her family history. Her grandmother lived into her nineties, but both her grandmother and father have had cancer. She reports persistent abdominal discomfort and is worried about the possibility of cancer, exacerbated by her father's condition. She has lost approximately 90 pounds and is adjusting to her new body image.  She is experiencing significant stress and emotional turmoil, impacting her sleep and overall well-being. She feels overwhelmed and scared for her family, particularly her mother, who is also dealing with health issues. She is considering medication to help manage her anxiety and stress, as suggested by her sister, who has had similar experiences.  Pertinent items are noted in HPI.  Most Recent Depression Screen:     12/09/2023    9:13 AM 01/28/2022    8:55 AM 03/18/2021    2:58 PM  Depression screen PHQ 2/9  Decreased Interest 0 0 0  Down, Depressed, Hopeless 1 1 0  PHQ - 2 Score 1 1 0  Altered sleeping  0 0  Tired, decreased energy  0 0  Change in appetite  0 0  Feeling bad or failure about yourself   1 1  Trouble  concentrating  1 2  Moving slowly or fidgety/restless  0 0  Suicidal thoughts  0 0  PHQ-9 Score  3 3  Difficult doing work/chores   Not difficult at all   Most Recent Anxiety Screen:     01/28/2022    8:55 AM 03/18/2021    2:59 PM  GAD 7 : Generalized Anxiety Score  Nervous, Anxious, on Edge 1 0  Control/stop worrying 0 1  Worry too much - different things 0 1  Trouble relaxing 0 1  Restless 0 0  Easily annoyed or irritable 1 1  Afraid - awful might happen 0 0  Total GAD 7 Score 2 4  Anxiety Difficulty Not difficult at all Not difficult at all   Most Recent Fall Screen:    12/09/2023    9:13 AM 08/10/2022    3:47 PM 01/28/2022    8:49 AM 03/18/2021    2:58 PM 01/28/2016    1:00 PM  Fall Risk   Falls in the past year? 0 0 0 0 No   Number falls in past yr: 0 0 0 0   Injury with Fall? 0 0 0 0   Risk for fall due to : No Fall Risks No Fall Risks No Fall Risks No Fall Risks   Follow up Falls evaluation completed Falls evaluation completed Falls evaluation completed;Education provided  Falls evaluation completed;Education provided  Data saved with a previous flowsheet row definition    Past medical history, surgical history, medications, allergies, family history and social history reviewed with patient today and changes made to appropriate areas of the chart.  Past Medical History:  Past Medical History:  Diagnosis Date   Abnormal uterine bleeding due to adenomyosis 03/16/2018   Allergic rhinitis    CAP (community acquired pneumonia) 07/20/2013   Cervical spondylosis    Right radiculopathy->MRI confirmed right sided C7 compression 11/13/19   Diverticulosis    H/O LEEP    1999 and 2000.  Cerv dysplasia ->colpo looks like low grade but f/u ECC--per GYN note 03/2019   History of ectopic pregnancy 2009   left, tube preserved   IBS (irritable bowel syndrome)    Iron deficiency 10/2015   w/out anemia (from pt's menorrhagia). Symptomatic : fatigue and hair loss.  Intol of  oral iron.  Hematology saw her 12/25/15 and did feraheme  x 2 doses.  Pt's symptoms abated nicely with this. She has avg'd 2 doses of IV iron per year (as of 11/2018).   Menorrhagia    pelvic ultrasound 02/2018-->possible adenomyosis per GYN.  Ongoing GYN mgmt/considering ablation as of 03/2019.   Menorrhagia with regular cycle 12/31/2015   Need for prophylactic vaccination and inoculation against influenza 01/28/2016   Obesity, Class II, BMI 35-39.9 09/29/2007   Qualifier: Diagnosis of  By: Georgian ROSALEA CHARM Lamar    Ocular migraine    Plantar fasciitis of left foot 07/12/2012   TMJ pain dysfunction syndrome 2015   (causing otalgia/referred pain)-ENT is Dr. Carlie   Vitamin D  deficiency    Medications:  Current Outpatient Medications on File Prior to Visit  Medication Sig   amphetamine -dextroamphetamine  (ADDERALL) 10 MG tablet Take 1 tablet (10 mg total) by mouth 2 (two) times daily.   tirzepatide  (ZEPBOUND ) 10 MG/0.5ML Pen Inject 10 mg into the skin once a week.   Cholecalciferol (VITAMIN D3) 1.25 MG (50000 UT) TABS Take 1 tablet by mouth once a week. (Patient not taking: Reported on 12/09/2023)   No current facility-administered medications on file prior to visit.   Surgical History:  Past Surgical History:  Procedure Laterality Date   CESAREAN SECTION      2007 & 2010   CHOLECYSTECTOMY     2004 (for dysfunctional GB not for stones)   COLONOSCOPY     Severe diverticulosis from transverse to sigmoid colon, with increased spasm and tortuosity: likely cause of her urgency.  Recall 5 yrs (Dr. Sheppard Finn).   COLPOSCOPY  04/23/2019   endom polypectomy  05/07/2019   ENDOMETRIAL ABLATION  05/07/2019   LAPAROSCOPY FOR ECTOPIC PREGNANCY  07/26/2007   salpingostomy for ectopic   LEEP  12/1997 & 04/1998   CIN III   TYMPANOSTOMY TUBE PLACEMENT  as a child   Allergies:  Allergies  Allergen Reactions   Sulfa Antibiotics Hives   Family History:  Family History  Problem Relation Age of Onset    Bipolar disorder Mother    Diabetes Mother    Colon polyps Mother    Irritable bowel syndrome Mother    Hypertension Father    Colon polyps Father    Celiac disease Sister    Pernicious anemia Sister    Peripheral vascular disease Maternal Grandmother        died during carotid surgery   Colon cancer Neg Hx    Esophageal cancer Neg Hx    Rectal cancer Neg Hx    Stomach cancer Neg Hx  Objective:    BP 120/74   Pulse 80   Wt 144 lb 3.2 oz (65.4 kg)   BMI 25.54 kg/m   Wt Readings from Last 3 Encounters:  12/09/23 144 lb 3.2 oz (65.4 kg)  02/15/23 186 lb 3.2 oz (84.5 kg)  09/23/22 208 lb 12.8 oz (94.7 kg)    Physical Exam Vitals and nursing note reviewed.  Constitutional:      Appearance: Normal appearance.  HENT:     Head: Normocephalic.  Eyes:     Pupils: Pupils are equal, round, and reactive to light.  Cardiovascular:     Rate and Rhythm: Normal rate and regular rhythm.     Pulses: Normal pulses.     Heart sounds: Normal heart sounds.  Pulmonary:     Effort: Pulmonary effort is normal.     Breath sounds: Normal breath sounds.  Skin:    General: Skin is warm and dry.     Capillary Refill: Capillary refill takes less than 2 seconds.  Neurological:     Mental Status: She is alert.  Psychiatric:        Attention and Perception: Attention normal.        Mood and Affect: Mood is anxious and depressed. Affect is tearful.        Speech: Speech normal.        Behavior: Behavior normal. Behavior is cooperative.        Thought Content: Thought content normal.    Results for orders placed or performed in visit on 08/20/22  POCT URINALYSIS DIP (CLINITEK)   Collection Time: 08/20/22 12:51 PM  Result Value Ref Range   Color, UA yellow yellow   Clarity, UA clear clear   Glucose, UA negative negative mg/dL   Bilirubin, UA negative negative   Ketones, POC UA trace (5) (A) negative mg/dL   Spec Grav, UA 8.989 8.989 - 1.025   Blood, UA moderate (A) negative   pH,  UA 6.0 5.0 - 8.0   POC PROTEIN,UA negative negative, trace   Urobilinogen, UA 0.2 0.2 or 1.0 E.U./dL   Nitrite, UA Negative Negative   Leukocytes, UA Negative Negative       Assessment & Plan:   Problem List Items Addressed This Visit     Class 2 severe obesity due to excess calories with serious comorbidity and body mass index (BMI) of 39.0 to 39.9 in adult [facility]) - Primary   Has lost approximately 90 pounds and is currently on Mounjaro  for weight management. Concerns about body image and adjusting to weight loss. - Continue current weight management regimen with Mounjaro .      Attention deficit hyperactivity disorder (ADHD), predominantly inattentive type   Well controlled on adderall 10mg  with no concerning side effects and symptoms. Will plan to continue.       Reactive depression (situational)   Significant emotional distress related to father's terminal illness, feeling overwhelmed, scared, and emotionally exhausted. - Prescribe Zoloft , starting with half a tab for the first four nights, then increase to a full tab.      Relevant Medications   sertraline  (ZOLOFT ) 50 MG tablet   Right upper quadrant abdominal pain   Persistent abdominal pain with concern for underlying condition due to family history of pancreatic cancer. Differential includes benign causes such as cysts or gas. - Order CT scan of the abdomen and pelvis to evaluate the cause of abdominal pain.      Relevant Orders   CT ABDOMEN PELVIS WO CONTRAST  Family history of pancreatic cancer   Family history includes father and grandmother with pancreatic cancer, indicating potential genetic predisposition.      Follow up plan: No follow-ups on file.  NEXT PREVENTATIVE PHYSICAL DUE IN 1 YEAR.  PATIENT COUNSELING PROVIDED FOR ALL ADULT PATIENTS: A well balanced diet low in saturated fats, cholesterol, and moderation in carbohydrates.  This can be as simple as monitoring portion sizes and cutting back on  sugary beverages such as soda and juice to start with.    Daily water consumption of at least 64 ounces.  Physical activity at least 180 minutes per week.  If just starting out, start 10 minutes a day and work your way up.   This can be as simple as taking the stairs instead of the elevator and walking 2-3 laps around the office  purposefully every day.   STD protection, partner selection, and regular testing if high risk.  Limited consumption of alcoholic beverages if alcohol is consumed. For men, I recommend no more than 14 alcoholic beverages per week, spread out throughout the week (max 2 per day). Avoid binge drinking or consuming large quantities of alcohol in one setting.  Please let me know if you feel you may need help with reduction or quitting alcohol consumption.   Avoidance of nicotine, if used. Please let me know if you feel you may need help with reduction or quitting nicotine use.   Daily mental health attention. This can be in the form of 5 minute daily meditation, prayer, journaling, yoga, reflection, etc.  Purposeful attention to your emotions and mental state can significantly improve your overall wellbeing  and  Health.  Please know that I am here to help you with all of your health care goals and am happy to work with you to find a solution that works best for you.  The greatest advice I have received with any changes in life are to take it one step at a time, that even means if all you can focus on is the next 60 seconds, then do that and celebrate your victories.  With any changes in life, you will have set backs, and that is OK. The important thing to remember is, if you have a set back, it is not a failure, it is an opportunity to try again! Screening Testing Mammogram Every 1 -2 years based on history and risk factors Starting at age 25 Pap Smear Ages 21-39 every 3 years Ages 30-65 every 5 years with HPV testing More frequent testing may be required based  on results and history Colon Cancer Screening Every 1-10 years based on test performed, risk factors, and history Starting at age 22 Bone Density Screening Every 2-10 years based on history Starting at age 77 for women Recommendations for men differ based on medication usage, history, and risk factors AAA Screening One time ultrasound Men 23-70 years old who have every smoked Lung Cancer Screening Low Dose Lung CT every 12 months Age 74-80 years with a 30 pack-year smoking history who still smoke or who have quit within the last 15 years   Screening Labs Routine  Labs: Complete Blood Count (CBC), Complete Metabolic Panel (CMP), Cholesterol (Lipid Panel) Every 6-12 months based on history and medications May be recommended more frequently based on current conditions or previous results Hemoglobin A1c Lab Every 3-12 months based on history and previous results Starting at age 28 or earlier with diagnosis of diabetes, high cholesterol, BMI >26, and/or risk factors Frequent  monitoring for patients with diabetes to ensure blood sugar control Thyroid  Panel (TSH) Every 6 months based on history, symptoms, and risk factors May be repeated more often if on medication HIV One time testing for all patients 71 and older May be repeated more frequently for patients with increased risk factors or exposure Hepatitis C One time testing for all patients 15 and older May be repeated more frequently for patients with increased risk factors or exposure Gonorrhea, Chlamydia Every 12 months for all sexually active persons 13-24 years Additional monitoring may be recommended for those who are considered high risk or who have symptoms Every 12 months for any woman on birth control, regardless of sexual activity PSA Men 2-34 years old with risk factors Additional screening may be recommended from age 65-69 based on risk factors, symptoms, and history  Vaccine Recommendations Tetanus Booster All  adults every 10 years Flu Vaccine All patients 6 months and older every year COVID Vaccine All patients 12 years and older Initial dosing with booster May recommend additional booster based on age and health history HPV Vaccine 2 doses all patients age 50-26 Dosing may be considered for patients over 26 Shingles Vaccine (Shingrix) 2 doses all adults 55 years and older Pneumonia (Pneumovax 23) All adults 65 years and older May recommend earlier dosing based on health history One year apart from Prevnar 13 Pneumonia (Prevnar 44) All adults 65 years and older Dosed 1 year after Pneumovax 23 Pneumonia (Prevnar 20) One time alternative to the two dosing of 13 and 23 For all adults with initial dose of 23, 20 is recommended 1 year later For all adults with initial dose of 13, 23 is still recommended as second option 1 year later

## 2023-12-13 ENCOUNTER — Encounter: Payer: Self-pay | Admitting: Family

## 2023-12-13 ENCOUNTER — Inpatient Hospital Stay: Admission: RE | Admit: 2023-12-13 | Payer: Self-pay | Source: Ambulatory Visit

## 2023-12-13 ENCOUNTER — Ambulatory Visit
Admission: RE | Admit: 2023-12-13 | Discharge: 2023-12-13 | Disposition: A | Discharge status: Home / Self Care | Payer: Self-pay | Source: Ambulatory Visit | Attending: Nurse Practitioner | Admitting: Nurse Practitioner

## 2023-12-13 DIAGNOSIS — R1011 Right upper quadrant pain: Secondary | ICD-10-CM

## 2023-12-19 ENCOUNTER — Encounter: Payer: Self-pay | Admitting: Nurse Practitioner

## 2023-12-19 DIAGNOSIS — Z800 Family history of malignant neoplasm of digestive organs: Secondary | ICD-10-CM | POA: Insufficient documentation

## 2023-12-19 DIAGNOSIS — R1011 Right upper quadrant pain: Secondary | ICD-10-CM | POA: Insufficient documentation

## 2023-12-19 DIAGNOSIS — F329 Major depressive disorder, single episode, unspecified: Secondary | ICD-10-CM | POA: Insufficient documentation

## 2023-12-19 NOTE — Assessment & Plan Note (Signed)
 Family history includes father and grandmother with pancreatic cancer, indicating potential genetic predisposition.

## 2023-12-19 NOTE — Assessment & Plan Note (Signed)
 Persistent abdominal pain with concern for underlying condition due to family history of pancreatic cancer. Differential includes benign causes such as cysts or gas. - Order CT scan of the abdomen and pelvis to evaluate the cause of abdominal pain.

## 2023-12-19 NOTE — Assessment & Plan Note (Signed)
 Significant emotional distress related to father's terminal illness, feeling overwhelmed, scared, and emotionally exhausted. - Prescribe Zoloft , starting with half a tab for the first four nights, then increase to a full tab.

## 2023-12-19 NOTE — Assessment & Plan Note (Signed)
 Well controlled on adderall 10mg  with no concerning side effects and symptoms. Will plan to continue.

## 2023-12-19 NOTE — Assessment & Plan Note (Signed)
 Has lost approximately 90 pounds and is currently on Mounjaro  for weight management. Concerns about body image and adjusting to weight loss. - Continue current weight management regimen with Mounjaro .

## 2023-12-22 ENCOUNTER — Encounter: Payer: Self-pay | Admitting: Nurse Practitioner

## 2023-12-23 ENCOUNTER — Other Ambulatory Visit: Payer: Self-pay

## 2023-12-23 ENCOUNTER — Ambulatory Visit: Payer: Self-pay | Admitting: Nurse Practitioner

## 2023-12-23 DIAGNOSIS — K3189 Other diseases of stomach and duodenum: Secondary | ICD-10-CM

## 2024-01-25 ENCOUNTER — Ambulatory Visit: Payer: Self-pay | Admitting: Internal Medicine

## 2024-01-25 ENCOUNTER — Encounter: Payer: Self-pay | Admitting: Internal Medicine

## 2024-01-25 VITALS — BP 106/64 | HR 76 | Ht 63.5 in | Wt 141.0 lb

## 2024-01-25 DIAGNOSIS — K5904 Chronic idiopathic constipation: Secondary | ICD-10-CM

## 2024-01-25 DIAGNOSIS — Z800 Family history of malignant neoplasm of digestive organs: Secondary | ICD-10-CM | POA: Diagnosis not present

## 2024-01-25 DIAGNOSIS — K5909 Other constipation: Secondary | ICD-10-CM | POA: Diagnosis not present

## 2024-01-25 DIAGNOSIS — K5902 Outlet dysfunction constipation: Secondary | ICD-10-CM

## 2024-01-25 DIAGNOSIS — R933 Abnormal findings on diagnostic imaging of other parts of digestive tract: Secondary | ICD-10-CM | POA: Diagnosis not present

## 2024-01-25 DIAGNOSIS — K649 Unspecified hemorrhoids: Secondary | ICD-10-CM | POA: Diagnosis not present

## 2024-01-25 NOTE — Progress Notes (Signed)
 Subjective:    Patient ID: [name], female    DOB: March 08, 1976, 48 y.o.   MRN: [id number]  HPI [name] is a 48 year old female who presents for follow-up after a CT scan showing gastric fundus wall thickening.  She had a colonoscopy on Aug 31, 2021, due to a family history of multiple colonic polyps in her mother. The colonoscopy was normal except for a 6 mm sigmoid colon polyp, which was removed and identified as a tubular adenoma without high-grade dysplasia.  A CT scan of the abdomen and pelvis without contrast on December 13, 2023, showed wall thickening of the gastric fundus, possibly related to peristalsis. There was no evidence of bowel obstruction, hydronephrosis, or renal, ureteral, or bladder calculi. The liver appeared unremarkable, the gallbladder was surgically absent without biliary ductal dilatation, and there was no pancreatic dilation or evidence of acute inflammation. The spleen was of normal size.  She experienced an acute onset of anorectal pain in April 2024 due to a thrombosed hemorrhoid, which was excised on August 20, 2022. She later had worsening pain, leading to suture removal three days later and treatment with ceftriaxone  IM and Percocet. Her bowel movements are harder, and she sometimes needs to press on her perineum to facilitate defecation. She has experienced bulging hemorrhoids, which cause discomfort but not significant pain.  She has lost about 95 pounds, currently weighing 140 pounds, and attributes this to the medication Zepbound , which she takes at a dose of 10 mg. She spreads the doses out to 10-11 days due to cost. She also takes sertraline  and Adderall.  Her father recently passed away from pancreatic cancer on 01/23/24, and his mother also died of pancreatic cancer. Genetic testing on her father showed no genetic predisposition to pancreatic cancer.  No heartburn, trouble swallowing, nausea, vomiting, or abdominal pain.  Her  daughter is a Printmaker in the honors college at [employer].   Review of Systems As per HPI, otherwise negative  Current Medications, Allergies, Past Medical History, Past Surgical History, Family History and Social History were reviewed in [employer] record.    Objective:   Physical Exam BP 106/64   Pulse 76   Ht 5' 3.5 (1.613 m)   Wt 141 lb (64 kg)   BMI 24.59 kg/m  Gen: awake, alert, NAD HEENT: anicteric  Abd: soft, NT/ND, +BS throughout Ext: no c/c/e Neuro: nonfocal  RADIOLOGY CT abdomen and pelvis without contrast: Wall thickening of gastric fundus, no bowel obstruction, no hydronephrosis, no renal, ureteral or bladder calculi, unremarkable liver, gallbladder surgically absent, no biliary ductal dilatation, no pancreatic dilation, normal spleen size (12/13/2023)  DIAGNOSTIC Colonoscopy: Normal except for a 6 mm sigmoid colon polyp, removed (08/31/2021)  PATHOLOGY Sigmoid colon polyp: Tubular adenoma without high-grade dysplasia (08/31/2021)      Assessment & Plan:  Chronic constipation with disordered defecation Chronic constipation with possible disordered defecation. Potential GLP-1 therapy impact on gut motility. Possible dyssynergic defecation. - Recommend daily Miralax 17 g; perhaps every other day or half dose daily but some schedule is recommended. - Advise use of stool to elevate feet during defecation. - Consider referral to pelvic floor physical therapy if symptoms persist.  Hemorrhoids, status post thrombectomy, with residual skin tag Thrombosed hemorrhoid post-thrombectomy with residual skin tag. Occasional discomfort, no significant pain.  Gastric fundus wall thickening CT scan showed gastric fundus wall thickening, likely peristalsis-related. - Schedule endoscopy to evaluate gastric fundus.  Personal history  of colonic polyp (tubular adenoma) 6 mm sigmoid colon polyp removed, identified as tubular adenoma without  high-grade dysplasia. - Colonoscopy recommended in 5 years.  Obesity in remission with weight loss on GLP-1 receptor agonist therapy Significant weight loss with GLP-1 therapy. Current BMI 24.5. No significant side effects. Discussed cost and potential dose reduction. - Consider reducing GLP-1 dose to 7.5 mg and monitor effects. - Discuss potential further dose reduction to 2.5 mg for maintenance if tolerated which would be cheaper monthly  Family history of pancreatic cancer (father and paternal grandmother) Family history of pancreatic cancer. Recent CT scan showed normal pancreas. Father's genetic testing negative for hereditary cancer syndromes. She does not strictly meet current guidelines for testing however pancreatic cancer can cluster in families. -Would recommend contrasted CT or MRI of the pancreas beginning at age 44  40 minutes total spent today including patient facing time, coordination of care, reviewing medical history/procedures/pertinent radiology studies, and documentation of the encounter.

## 2024-01-25 NOTE — Patient Instructions (Addendum)
 Please purchase the following medications over the counter and take as directed: Miralax daily.   You can purchase an over the counter squatty potty to elevate your feet while having a bowel movement.   You have been scheduled for an endoscopy. Please follow written instructions given to you at your visit today.  If you use inhalers (even only as needed), please bring them with you on the day of your procedure.  If you take any of the following medications, they will need to be adjusted prior to your procedure:   DO NOT TAKE 7 DAYS PRIOR TO TEST- Trulicity (dulaglutide) Ozempic , Wegovy  (semaglutide ) Mounjaro  (tirzepatide ) Bydureon Bcise (exanatide extended release)  DO NOT TAKE 1 DAY PRIOR TO YOUR TEST Rybelsus  (semaglutide ) Adlyxin (lixisenatide) Victoza (liraglutide) Byetta (exanatide) _______________________________________________________________________ _______________________________________________________  If your blood pressure at your visit was 140/90 or greater, please contact your primary care physician to follow up on this.  _______________________________________________________  If you are age 44 or older, your body mass index should be between 23-30. Your Body mass index is 24.59 kg/m. If this is out of the aforementioned range listed, please consider follow up with your Primary Care Provider.  If you are age 64 or younger, your body mass index should be between 19-25. Your Body mass index is 24.59 kg/m. If this is out of the aformentioned range listed, please consider follow up with your Primary Care Provider.   ________________________________________________________  The [facility] providers would like to encourage you to use MYCHART to communicate with providers for non-urgent requests or questions.  Due to long hold times on the telephone, sending your provider a message by [facility] may be a faster and more efficient way to get a response.  Please allow 48  business hours for a response.  Please remember that this is for non-urgent requests.  _______________________________________________________  [facility] is using a team-based approach to care.  Your team is made up of your doctor and two to three APPS. Our APPS (Nurse Practitioners and Physician Assistants) work with your physician to ensure care continuity for you. They are fully qualified to address your health concerns and develop a treatment plan. They communicate directly with your gastroenterologist to care for you. Seeing the Advanced Practice Practitioners on your physician's team can help you by facilitating care more promptly, often allowing for earlier appointments, access to diagnostic testing, procedures, and other specialty referrals.

## 2024-01-30 ENCOUNTER — Other Ambulatory Visit: Payer: Self-pay | Admitting: Nurse Practitioner

## 2024-01-30 ENCOUNTER — Encounter: Payer: Self-pay | Admitting: Internal Medicine

## 2024-01-30 ENCOUNTER — Ambulatory Visit: Admitting: Internal Medicine

## 2024-01-30 VITALS — BP 106/65 | HR 71 | Temp 98.4°F | Resp 10 | Ht 63.0 in | Wt 142.0 lb

## 2024-01-30 DIAGNOSIS — K2970 Gastritis, unspecified, without bleeding: Secondary | ICD-10-CM

## 2024-01-30 DIAGNOSIS — R933 Abnormal findings on diagnostic imaging of other parts of digestive tract: Secondary | ICD-10-CM | POA: Diagnosis not present

## 2024-01-30 DIAGNOSIS — F329 Major depressive disorder, single episode, unspecified: Secondary | ICD-10-CM

## 2024-01-30 MED ORDER — SODIUM CHLORIDE 0.9 % IV SOLN: 500.0000 mL | Freq: Once | INTRAVENOUS | Status: DC

## 2024-01-30 NOTE — Patient Instructions (Signed)
 YOU HAD AN ENDOSCOPIC PROCEDURE TODAY AT THE [facility]:   Refer to the procedure report that was given to you for any specific questions about what was found during the examination.  If the procedure report does not answer your questions, please call your gastroenterologist to clarify.  If you requested that your care partner not be given the details of your procedure findings, then the procedure report has been included in a sealed envelope for you to review at your convenience later.  YOU SHOULD EXPECT: Some feelings of bloating in the abdomen. Passage of more gas than usual.  Walking can help get rid of the air that was put into your GI tract during the procedure and reduce the bloating. If you had a lower endoscopy (such as a colonoscopy or flexible sigmoidoscopy) you may notice spotting of blood in your stool or on the toilet paper. If you underwent a bowel prep for your procedure, you may not have a normal bowel movement for a few days.  Please Note:  You might notice some irritation and congestion in your nose or some drainage.  This is from the oxygen used during your procedure.  There is no need for concern and it should clear up in a day or so.  SYMPTOMS TO REPORT IMMEDIATELY:  Following upper endoscopy (EGD)  Vomiting of blood or coffee ground material  New chest pain or pain under the shoulder blades  Painful or persistently difficult swallowing  New shortness of breath  Fever of 100F or higher  Black, tarry-looking stools  Resume previous diet Continue present medications Await pathology results  For urgent or emergent issues, a gastroenterologist can be reached at any hour by calling [phone]. Do not use MyChart messaging for urgent concerns.    DIET:  We do recommend a small meal at first, but then you may proceed to your regular diet.  Drink plenty of fluids but you should avoid alcoholic beverages for 24 hours.  ACTIVITY:  You should plan to take it  easy for the rest of today and you should NOT DRIVE or use heavy machinery until tomorrow (because of the sedation medicines used during the test).    FOLLOW UP: Our staff will call the number listed on your records the next business day following your procedure.  We will call around 7:15- 8:00 am to check on you and address any questions or concerns that you may have regarding the information given to you following your procedure. If we do not reach you, we will leave a message.     If any biopsies were taken you will be contacted by phone or by letter within the next 1-3 weeks.  Please call us  at [phone] if you have not heard about the biopsies in 3 weeks.    SIGNATURES/CONFIDENTIALITY: You and/or your care partner have signed paperwork which will be entered into your electronic medical record.  These signatures attest to the fact that that the information above on your After Visit Summary has been reviewed and is understood.  Full responsibility of the confidentiality of this discharge information lies with you and/or your care-partner.

## 2024-01-30 NOTE — Progress Notes (Signed)
Vitals-CW  Pt's states no medical or surgical changes since previsit or office visit. 

## 2024-01-30 NOTE — Progress Notes (Unsigned)
 Called to room to assist during endoscopic procedure.  Patient ID and intended procedure confirmed with present staff. Received instructions for my participation in the procedure from the performing physician.

## 2024-01-30 NOTE — Progress Notes (Unsigned)
 See office note dated 01/25/2024 for details and current H&P.  Patient presenting for upper endoscopy to evaluate abnormal CT scan of the stomach suggesting gastric wall thickening in the fundus.  She remains appropriate for EGD in the [facility] today.

## 2024-01-30 NOTE — Progress Notes (Unsigned)
 Report to PACU, RN, vss, BBS= Clear.

## 2024-01-30 NOTE — Op Note (Signed)
 [facility] Patient Name: [name] Procedure Date: 01/30/2024 3:26 PM MRN: [id number] Endoscopist: Gordy CHRISTELLA Starch , MD, [id number] Age: 48 Referring MD:  Date of Birth: Sep 16, 1975 Gender: Female Account #: [id number] Procedure:                Upper GI endoscopy Indications:              Abnormal CT of the GI tract (Gastric fundus                            thickening) Medicines:                Monitored Anesthesia Care Procedure:                Pre-Anesthesia Assessment:                           - Prior to the procedure, a History and Physical                            was performed, and patient medications and                            allergies were reviewed. The patient's tolerance of                            previous anesthesia was also reviewed. The risks                            and benefits of the procedure and the sedation                            options and risks were discussed with the patient.                            All questions were answered, and informed consent                            was obtained. Prior Anticoagulants: The patient has                            taken no anticoagulant or antiplatelet agents. ASA                            Grade Assessment: II - A patient with mild systemic                            disease. After reviewing the risks and benefits,                            the patient was deemed in satisfactory condition to                            undergo the procedure.  After obtaining informed consent, the endoscope was                            passed under direct vision. Throughout the                            procedure, the patient's blood pressure, pulse, and                            oxygen saturations were monitored continuously. The                            GIF [id number] #[id number] was introduced through the                            mouth, and advanced to the second part of duodenum.                             The upper GI endoscopy was accomplished without                            difficulty. The patient tolerated the procedure                            well. Scope In: Scope Out: Findings:                 The examined esophagus was normal.                           Scattered mild inflammation characterized by                            erosions and granularity was found in the gastric                            body. Biopsies were taken with a cold forceps for                            Helicobacter pylori testing.                           There is no endoscopic evidence of abnormality in                            the cardia and in the gastric fundus.                           The examined duodenum was normal. Complications:            No immediate complications. Estimated Blood Loss:     Estimated blood loss was minimal. Impression:               - Normal esophagus.                           -  Normal gastric cardia and fundus.                           - Mild scattered gastritis in gastric body.                            Biopsied.                           - Normal examined duodenum. Recommendation:           - Patient has a contact number available for                            emergencies. The signs and symptoms of potential                            delayed complications were discussed with the                            patient. Return to normal activities tomorrow.                            Written discharge instructions were provided to the                            patient.                           - Resume previous diet.                           - Continue present medications.                           - Await pathology results. Gordy CHRISTELLA Starch, MD 01/30/2024 3:42:34 PM This report has been signed electronically.

## 2024-01-31 ENCOUNTER — Telehealth: Payer: Self-pay

## 2024-01-31 NOTE — Telephone Encounter (Signed)
 No answer on follow-up call. Left VM for pt.

## 2024-02-02 LAB — SURGICAL PATHOLOGY

## 2024-02-03 ENCOUNTER — Ambulatory Visit: Payer: Self-pay | Admitting: Internal Medicine

## 2024-03-02 ENCOUNTER — Other Ambulatory Visit (HOSPITAL_BASED_OUTPATIENT_CLINIC_OR_DEPARTMENT_OTHER): Payer: Self-pay

## 2024-03-02 ENCOUNTER — Other Ambulatory Visit: Payer: Self-pay

## 2024-05-22 ENCOUNTER — Encounter: Admitting: Nurse Practitioner

## 2024-05-25 ENCOUNTER — Encounter: Payer: Self-pay | Admitting: Nurse Practitioner

## 2024-05-25 ENCOUNTER — Ambulatory Visit: Payer: Self-pay | Admitting: Nurse Practitioner

## 2024-05-25 VITALS — BP 122/74 | HR 69 | Ht 63.0 in | Wt 140.2 lb

## 2024-05-25 DIAGNOSIS — E559 Vitamin D deficiency, unspecified: Secondary | ICD-10-CM

## 2024-05-25 DIAGNOSIS — Z1321 Encounter for screening for nutritional disorder: Secondary | ICD-10-CM

## 2024-05-25 DIAGNOSIS — Z0000 Encounter for general adult medical examination without abnormal findings: Secondary | ICD-10-CM | POA: Diagnosis not present

## 2024-05-25 DIAGNOSIS — Z6839 Body mass index (BMI) 39.0-39.9, adult: Secondary | ICD-10-CM | POA: Diagnosis not present

## 2024-05-25 DIAGNOSIS — Z130 Encounter for screening for diseases of the blood and blood-forming organs and certain disorders involving the immune mechanism: Secondary | ICD-10-CM

## 2024-05-25 DIAGNOSIS — M25552 Pain in left hip: Secondary | ICD-10-CM

## 2024-05-25 DIAGNOSIS — F900 Attention-deficit hyperactivity disorder, predominantly inattentive type: Secondary | ICD-10-CM | POA: Diagnosis not present

## 2024-05-25 DIAGNOSIS — Z1329 Encounter for screening for other suspected endocrine disorder: Secondary | ICD-10-CM

## 2024-05-25 DIAGNOSIS — M542 Cervicalgia: Secondary | ICD-10-CM | POA: Diagnosis not present

## 2024-05-25 DIAGNOSIS — H02403 Unspecified ptosis of bilateral eyelids: Secondary | ICD-10-CM | POA: Diagnosis not present

## 2024-05-25 DIAGNOSIS — F329 Major depressive disorder, single episode, unspecified: Secondary | ICD-10-CM | POA: Diagnosis not present

## 2024-05-25 DIAGNOSIS — E66812 Obesity, class 2: Secondary | ICD-10-CM

## 2024-05-25 DIAGNOSIS — E611 Iron deficiency: Secondary | ICD-10-CM

## 2024-05-25 MED ORDER — AMPHETAMINE-DEXTROAMPHETAMINE 10 MG PO TABS: 10.0000 mg | ORAL_TABLET | Freq: Two times a day (BID) | ORAL | 0 refills | Status: AC

## 2024-05-25 MED ORDER — SERTRALINE HCL 50 MG PO TABS: 50.0000 mg | ORAL_TABLET | Freq: Every day | ORAL | 1 refills | Status: AC

## 2024-05-25 NOTE — Assessment & Plan Note (Addendum)
 BMI is within a healthy range. Weight management is ongoing with Tirzepatide  (Zepbound ) every two and a half weeks, effectively controlling cravings and aiding in weight maintenance. No signs of malnutrition or significant weight loss-related complications. - Continue Tirzepatide  (Zepbound ) every two and a half weeks.

## 2024-05-25 NOTE — Assessment & Plan Note (Addendum)
 Recurrent left hip pain, previously managed with an injection that provided relief for several years. Pain is less severe than before but still present, especially when lying down. Possible arthritis or bursitis considered as underlying causes. - Referred to previous specialist for evaluation and possible injection. Orders:   Ambulatory referral to Orthopedic Surgery

## 2024-05-25 NOTE — Assessment & Plan Note (Addendum)
 Adderall is effective in managing symptoms, providing clarity and calmness. She is a slow metabolizer, allowing the morning dose to last throughout the day. Occasionally forgets doses but is reassured about the long half-life of the medication. - Continue Adderall 10 mg oral bid as needed. - Refilled Adderall prescription. Orders:   amphetamine -dextroamphetamine  (ADDERALL) 10 MG tablet; Take 1 tablet (10 mg total) by mouth 2 (two) times daily.

## 2024-05-25 NOTE — Assessment & Plan Note (Addendum)
 Recent situational stressors, including family illness and bereavement, have been managed with Zoloft . Reports improvement in mood with consistent medication use. Occasionally forgets doses but is reassured about the long half-life of the medication. - Continue Zoloft  as prescribed. - Refilled Zoloft  prescription. Orders:   sertraline  (ZOLOFT ) 50 MG tablet; Take 1 tablet (50 mg total) by mouth daily.

## 2024-05-25 NOTE — Assessment & Plan Note (Addendum)
" ° ° °  Orders:   CBC with Differential/Platelet   Comprehensive metabolic panel with GFR   VITAMIN D  25 Hydroxy (Vit-D Deficiency, Fractures)   Lipid panel  "

## 2024-05-25 NOTE — Patient Instructions (Signed)
 For all adult patients, I recommend A well balanced diet low in saturated fats, cholesterol, and moderation in carbohydrates.   This can be as simple as monitoring portion sizes and cutting back on sugary beverages such as soda and juice to start with.    Daily water consumption of at least 64 ounces.  Physical activity at least 180 minutes per week, if just starting out.   This can be as simple as taking the stairs instead of the elevator and walking 2-3 laps around the office  purposefully every day.   STD protection, partner selection, and regular testing if high risk.  Limited consumption of alcoholic beverages if alcohol is consumed.  For women, I recommend no more than 7 alcoholic beverages per week, spread out throughout the week.  Avoid binge drinking or consuming large quantities of alcohol in one setting.   Please let me know if you feel you may need help with reduction or quitting alcohol consumption.   Avoidance of nicotine, if used.  Please let me know if you feel you may need help with reduction or quitting nicotine use.   Daily mental health attention.  This can be in the form of 5 minute daily meditation, prayer, journaling, yoga, reflection, etc.   Purposeful attention to your emotions and mental state can significantly improve your overall wellbeing  and  Health.  Please know that I am here to help you with all of your health care goals and am happy to work with you to find a solution that works best for you.  The greatest advice I have received with any changes in life are to take it one step at a time, that even means if all you can focus on is the next 60 seconds, then do that and celebrate your victories.  With any changes in life, you will have set backs, and that is OK. The important thing to remember is, if you have a set back, it is not a failure, it is an opportunity to try again!  Health Maintenance Recommendations Screening Testing Mammogram Every 1 -2  years based on history and risk factors Starting at age 15 Pap Smear Ages 21-39 every 3 years Ages 9-65 every 5 years with HPV testing More frequent testing may be required based on results and history Colon Cancer Screening Every 1-10 years based on test performed, risk factors, and history Starting at age 56 Bone Density Screening Every 2-10 years based on history Starting at age 55 for women Recommendations for men differ based on medication usage, history, and risk factors AAA Screening One time ultrasound Men 102-69 years old who have every smoked Lung Cancer Screening Low Dose Lung CT every 12 months Age 76-80 years with a 30 pack-year smoking history who still smoke or who have quit within the last 15 years  Screening Labs Routine  Labs: Complete Blood Count (CBC), Complete Metabolic Panel (CMP), Cholesterol (Lipid Panel) Every 6-12 months based on history and medications May be recommended more frequently based on current conditions or previous results Hemoglobin A1c Lab Every 3-12 months based on history and previous results Starting at age 11 or earlier with diagnosis of diabetes, high cholesterol, BMI >26, and/or risk factors Frequent monitoring for patients with diabetes to ensure blood sugar control Thyroid Panel (TSH w/ T3 & T4) Every 6 months based on history, symptoms, and risk factors May be repeated more often if on medication HIV One time testing for all patients 85 and older May be  repeated more frequently for patients with increased risk factors or exposure Hepatitis C One time testing for all patients 18 and older May be repeated more frequently for patients with increased risk factors or exposure Gonorrhea, Chlamydia Every 12 months for all sexually active persons 13-24 years Additional monitoring may be recommended for those who are considered high risk or who have symptoms PSA Men 60-66 years old with risk factors Additional screening may be  recommended from age 30-69 based on risk factors, symptoms, and history  Vaccine Recommendations Tetanus Booster All adults every 10 years Flu Vaccine All patients 6 months and older every year COVID Vaccine All patients 12 years and older Initial dosing with booster May recommend additional booster based on age and health history HPV Vaccine 2 doses all patients age 29-26 Dosing may be considered for patients over 26 Shingles Vaccine (Shingrix) 2 doses all adults 55 years and older Pneumonia (Pneumovax 23) All adults 65 years and older May recommend earlier dosing based on health history Pneumonia (Prevnar 12) All adults 65 years and older Dosed 1 year after Pneumovax 23  Additional Screening, Testing, and Vaccinations may be recommended on an individualized basis based on family history, health history, risk factors, and/or exposure.

## 2024-05-25 NOTE — Progress Notes (Signed)
 Mammogram Completed- documentation to be requested today. and Pap Smear Completed- documentation to be requested today.  [name], DNP, AGNP-c [facility] [address] [place], [place] [address] Main Office [phone] VISIT TYPE: CPE on 05/25/2024 Today's Vitals   05/25/24 1529  BP: 122/74  Pulse: 69  Weight: 140 lb 3.2 oz (63.6 kg)  Height: 5' 3 (1.6 m)   Body mass index is 24.84 kg/m.  Wt Readings from Last 3 Encounters:  05/25/24 140 lb 3.2 oz (63.6 kg)  01/30/24 142 lb (64.4 kg)  01/25/24 141 lb (64 kg)     Subjective:  Annual Exam (CPE, not fasting, will do labs next week, see's obgyn [facility] O[facility] last mammo and PAP last year, )  History of Present Illness [name] is a 49 year old female who presents for her annual exam.   She experiences recurrent hip pain, which has returned after several years of relief following a previous hip injection. The pain is particularly noticeable when lying down at night. Despite significant weight loss, she questions why the pain has returned. She recalls being referred to a specialist in the past, which helped alleviate the symptoms.  She expresses concerns about her eyelids feeling heavy, stating that she feels the need to 'hold my eyes open' and that her forehead is constantly tense. She describes her eyelids as hooded and feels that relaxing her face makes everything feel heavy as if the eyelids fall into her eyes. There is a family history of similar issues, as her mother had insurance-covered surgery for eyelid ptosis.  She has a history of neck issues, including a bone spur and a herniated disc, which were previously managed with physical therapy and steroids. She expresses fear of surgical intervention and notes that her neck problems occasionally require her to consider returning to physical therapy.  She discusses a concern about a possible rectal prolapse, describing a sensation of needing  to push on her perineum to facilitate bowel movements. She recalls a previous consultation with a gastroenterologist who suggested using a squatty potty and possible pelvic floor PT.  She is currently using Zepbound  every two and a half weeks to manage weight and Adderall as needed, noting that she often forgets to take the latter. She also takes Zoloft , which she resumed after experiencing emotional distress during a period of non-compliance. No shortness of breath, dizziness, changes in bowel or bladder habits, and other joint pain or weakness aside from the hip pain.  Pertinent items are noted in HPI.     05/25/2024    3:27 PM 12/09/2023    9:13 AM 01/28/2022    8:55 AM 03/18/2021    2:58 PM  Depression screen PHQ 2/9  Decreased Interest 0 0 0 0  Down, Depressed, Hopeless 0 1 1 0  PHQ - 2 Score 0 1 1 0  Altered sleeping   0 0  Tired, decreased energy   0 0  Change in appetite   0 0  Feeling bad or failure about yourself    1 1  Trouble concentrating   1 2  Moving slowly or fidgety/restless   0 0  Suicidal thoughts   0 0  PHQ-9 Score   3  3   Difficult doing work/chores    Not difficult at all     Data saved with a previous flowsheet row definition       01/28/2022    8:55 AM 03/18/2021    2:59 PM  GAD 7 : Generalized Anxiety Score  Nervous, Anxious, on Edge 1  0   Control/stop worrying 0  1   Worry too much - different things 0  1   Trouble relaxing 0  1   Restless 0  0   Easily annoyed or irritable 1  1   Afraid - awful might happen 0  0   Total GAD 7 Score 2 4  Anxiety Difficulty Not difficult at all Not difficult at all     Data saved with a previous flowsheet row definition       05/25/2024    3:26 PM 12/09/2023    9:13 AM 08/10/2022    3:47 PM 01/28/2022    8:49 AM 03/18/2021    2:58 PM  Fall Risk   Falls in the past year? 0 0 0 0 0  Number falls in past yr: 0 0 0 0 0  Injury with Fall? 0 0  0  0  0   Risk for fall due to : No Fall Risks No Fall Risks No Fall  Risks No Fall Risks No Fall Risks  Follow up Falls evaluation completed Falls evaluation completed Falls evaluation completed Falls evaluation completed;Education provided  Falls evaluation completed;Education provided      Data saved with a previous flowsheet row definition   Past medical history, surgical history, medications, allergies, family history and social history reviewed with patient today and changes made to appropriate areas of the chart.      Objective:    Physical Exam Vitals and nursing note reviewed.  Constitutional:      General: She is not in acute distress.    Appearance: Normal appearance.  HENT:     Head: Normocephalic and atraumatic.     Right Ear: Hearing, tympanic membrane, ear canal and external ear normal.     Left Ear: Hearing, tympanic membrane, ear canal and external ear normal.     Nose: Nose normal.     Right Sinus: No maxillary sinus tenderness or frontal sinus tenderness.     Left Sinus: No maxillary sinus tenderness or frontal sinus tenderness.     Mouth/Throat:     Lips: Pink.     Mouth: Mucous membranes are moist.     Pharynx: Oropharynx is clear.  Eyes:     General: Lids are normal. Vision grossly intact.     Extraocular Movements: Extraocular movements intact.     Conjunctiva/sclera: Conjunctivae normal.     Pupils: Pupils are equal, round, and reactive to light.     Funduscopic exam:    Right eye: Red reflex present.        Left eye: Red reflex present.    Visual Fields: Right eye visual fields normal and left eye visual fields normal.  Neck:     Thyroid : No thyromegaly.     Vascular: No carotid bruit.  Cardiovascular:     Rate and Rhythm: Normal rate and regular rhythm.     Chest Wall: PMI is not displaced.     Pulses: Normal pulses.          Dorsalis pedis pulses are 2+ on the right side and 2+ on the left side.       Posterior tibial pulses are 2+ on the right side and 2+ on the left side.     Heart sounds: Normal heart sounds. No  murmur heard. Pulmonary:     Effort: Pulmonary effort is normal. No respiratory distress.  Breath sounds: Normal breath sounds.  Abdominal:     General: Abdomen is flat. Bowel sounds are normal. There is no distension.     Palpations: Abdomen is soft. There is no hepatomegaly, splenomegaly or mass.     Tenderness: There is no abdominal tenderness. There is no right CVA tenderness, left CVA tenderness, guarding or rebound.  Musculoskeletal:        General: Tenderness present.     Cervical back: Full passive range of motion without pain, normal range of motion and neck supple. No tenderness.     Right lower leg: No edema.     Left lower leg: No edema.     Comments: Neck and left hip tenderness  Feet:     Left foot:     Toenail Condition: Left toenails are normal.  Lymphadenopathy:     Cervical: No cervical adenopathy.     Upper Body:     Right upper body: No supraclavicular adenopathy.     Left upper body: No supraclavicular adenopathy.  Skin:    General: Skin is warm and dry.     Capillary Refill: Capillary refill takes less than 2 seconds.     Nails: There is no clubbing.  Neurological:     General: No focal deficit present.     Mental Status: She is alert and oriented to person, place, and time.     GCS: GCS eye subscore is 4. GCS verbal subscore is 5. GCS motor subscore is 6.     Sensory: Sensation is intact. No sensory deficit.     Motor: Motor function is intact. No weakness.     Coordination: Coordination is intact. Coordination normal.     Gait: Gait is intact. Gait normal.     Deep Tendon Reflexes: Reflexes are normal and symmetric.  Psychiatric:        Attention and Perception: Attention normal.        Mood and Affect: Mood normal.        Speech: Speech normal.        Behavior: Behavior normal. Behavior is cooperative.        Thought Content: Thought content normal.        Cognition and Memory: Cognition and memory normal.        Judgment: Judgment normal.          Assessment & Plan:   Assessment & Plan Encounter for annual physical exam CPE completed today. Review of HM activities and recommendations discussed and provided on AVS. Anticipatory guidance, diet, and exercise recommendations provided. Medications, allergies, and hx reviewed and updated as necessary. Orders placed as listed below.  Plan: - Labs ordered. Will make changes as necessary based on results.  - I will review these results and send recommendations via MyChart or a telephone call.  - F/U with CPE in 1 year or sooner for acute/chronic health needs as directed.      Class 2 severe obesity due to excess calories with serious comorbidity and body mass index (BMI) of 39.0 to 39.9 in adult BMI is within a healthy range. Weight management is ongoing with Tirzepatide  (Zepbound ) every two and a half weeks, effectively controlling cravings and aiding in weight maintenance. No signs of malnutrition or significant weight loss-related complications. - Continue Tirzepatide  (Zepbound ) every two and a half weeks.    Attention deficit hyperactivity disorder (ADHD), predominantly inattentive type Adderall is effective in managing symptoms, providing clarity and calmness. She is a slow metabolizer, allowing the morning dose  to last throughout the day. Occasionally forgets doses but is reassured about the long half-life of the medication. - Continue Adderall 10 mg oral bid as needed. - Refilled Adderall prescription. Orders:   amphetamine -dextroamphetamine  (ADDERALL) 10 MG tablet; Take 1 tablet (10 mg total) by mouth 2 (two) times daily.  Reactive depression (situational) Recent situational stressors, including family illness and bereavement, have been managed with Zoloft . Reports improvement in mood with consistent medication use. Occasionally forgets doses but is reassured about the long half-life of the medication. - Continue Zoloft  as prescribed. - Refilled Zoloft   prescription. Orders:   sertraline  (ZOLOFT ) 50 MG tablet; Take 1 tablet (50 mg total) by mouth daily.  Screening for endocrine, nutritional, metabolic and immunity disorder Iron deficiency Vitamin D  deficiency    Orders:   CBC with Differential/Platelet   Comprehensive metabolic panel with GFR   VITAMIN D  25 Hydroxy (Vit-D Deficiency, Fractures)   Lipid panel  Left hip pain Recurrent left hip pain, previously managed with an injection that provided relief for several years. Pain is less severe than before but still present, especially when lying down. Possible arthritis or bursitis considered as underlying causes. - Referred to previous specialist for evaluation and possible injection. Orders:   Ambulatory referral to Orthopedic Surgery  Ptosis of both eyelids Ptosis causing heaviness and difficulty keeping eyes open. Discussed potential for plastic surgery, but insurance coverage is uncertain. She experiences significant discomfort and functional impairment due to ptosis. - Referred to plastic surgery for evaluation of eyelid ptosis.  Orders:   Ambulatory referral to Plastic Surgery  Neck pain Recurrent left hip pain, previously managed with an injection that provided relief for several years. Pain is less severe than before but still present, especially when lying down. Possible arthritis or bursitis considered as underlying causes. - Referred to previous specialist for evaluation and possible injection. Orders:   Ambulatory referral to Physical Therapy  NEXT PREVENTATIVE PHYSICAL DUE IN 1 YEAR.    PATIENT COUNSELING PROVIDED FOR ALL ADULT PATIENTS: A well balanced diet low in saturated fats, cholesterol, and moderation in carbohydrates.  This can be as simple as monitoring portion sizes and cutting back on sugary beverages such as soda and juice to start with.    Daily water consumption of at least 64 ounces.  Physical activity at least 180 minutes per week.  If just  starting out, start 10 minutes a day and work your way up.   This can be as simple as taking the stairs instead of the elevator and walking 2-3 laps around the office  purposefully every day.   STD protection, partner selection, and regular testing if high risk.  Limited consumption of alcoholic beverages if alcohol is consumed. For men, I recommend no more than 14 alcoholic beverages per week, spread out throughout the week (max 2 per day). Avoid binge drinking or consuming large quantities of alcohol in one setting.  Please let me know if you feel you may need help with reduction or quitting alcohol consumption.   Avoidance of nicotine, if used. Please let me know if you feel you may need help with reduction or quitting nicotine use.   Daily mental health attention. This can be in the form of 5 minute daily meditation, prayer, journaling, yoga, reflection, etc.  Purposeful attention to your emotions and mental state can significantly improve your overall wellbeing  and Health.  Please know that I am here to help you with all of your health care goals and am happy  to work with you to find a solution that works best for you.  The greatest advice I have received with any changes in life are to take it one step at a time, that even means if all you can focus on is the next 60 seconds, then do that and celebrate your victories.  With any changes in life, you will have set backs, and that is OK. The important thing to remember is, if you have a set back, it is not a failure, it is an opportunity to try again! Screening Testing Mammogram Every 1 -2 years based on history and risk factors Starting at age 70 Pap Smear Ages 21-39 every 3 years Ages 77-65 every 5 years with HPV testing More frequent testing may be required based on results and history Colon Cancer Screening Every 1-10 years based on test performed, risk factors, and history Starting at age 43 Bone Density Screening Every  2-10 years based on history Starting at age 10 for women Recommendations for men differ based on medication usage, history, and risk factors AAA Screening One time ultrasound Men 98-51 years old who have every smoked Lung Cancer Screening Low Dose Lung CT every 12 months Age 39-80 years with a 30 pack-year smoking history who still smoke or who have quit within the last 15 years   Screening Labs Routine  Labs: Complete Blood Count (CBC), Complete Metabolic Panel (CMP), Cholesterol (Lipid Panel) Every 6-12 months based on history and medications May be recommended more frequently based on current conditions or previous results Hemoglobin A1c Lab Every 3-12 months based on history and previous results Starting at age 8 or earlier with diagnosis of diabetes, high cholesterol, BMI >26, and/or risk factors Frequent monitoring for patients with diabetes to ensure blood sugar control Thyroid  Panel (TSH) Every 6 months based on history, symptoms, and risk factors May be repeated more often if on medication HIV One time testing for all patients 30 and older May be repeated more frequently for patients with increased risk factors or exposure Hepatitis C One time testing for all patients 27 and older May be repeated more frequently for patients with increased risk factors or exposure Gonorrhea, Chlamydia Every 12 months for all sexually active persons 13-24 years Additional monitoring may be recommended for those who are considered high risk or who have symptoms Every 12 months for any woman on birth control, regardless of sexual activity PSA Men 54-63 years old with risk factors Additional screening may be recommended from age 36-69 based on risk factors, symptoms, and history  Vaccine Recommendations Tetanus Booster All adults every 10 years Flu Vaccine All patients 6 months and older every year COVID Vaccine All patients 12 years and older Initial dosing with booster May  recommend additional booster based on age and health history HPV Vaccine 2 doses all patients age 5-26 Dosing may be considered for patients over 26 Shingles Vaccine (Shingrix) 2 doses all adults 55 years and older Pneumonia (Pneumovax 68) All adults 65 years and older May recommend earlier dosing based on health history One year apart from Prevnar 29 Pneumonia (Prevnar 82) All adults 65 years and older Dosed 1 year after Pneumovax 23 Pneumonia (Prevnar 20) One time alternative to the two dosing of 13 and 23 For all adults with initial dose of 23, 20 is recommended 1 year later For all adults with initial dose of 13, 23 is still recommended as second option 1 year later

## 2024-05-26 LAB — LIPID PANEL
Chol/HDL Ratio: 2.1 ratio (ref 0.0–4.4)
Cholesterol, Total: 152 mg/dL (ref 100–199)
HDL: 71 mg/dL
LDL Chol Calc (NIH): 71 mg/dL (ref 0–99)
Triglycerides: 46 mg/dL (ref 0–149)
VLDL Cholesterol Cal: 10 mg/dL (ref 5–40)

## 2024-05-26 LAB — CBC WITH DIFFERENTIAL/PLATELET
Basophils Absolute: 0 10*3/uL (ref 0.0–0.2)
Basos: 1 %
EOS (ABSOLUTE): 0.2 10*3/uL (ref 0.0–0.4)
Eos: 4 %
Hematocrit: 38.6 % (ref 34.0–46.6)
Hemoglobin: 12.8 g/dL (ref 11.1–15.9)
Immature Grans (Abs): 0 10*3/uL (ref 0.0–0.1)
Immature Granulocytes: 0 %
Lymphocytes Absolute: 1.9 10*3/uL (ref 0.7–3.1)
Lymphs: 35 %
MCH: 29.8 pg (ref 26.6–33.0)
MCHC: 33.2 g/dL (ref 31.5–35.7)
MCV: 90 fL (ref 79–97)
Monocytes Absolute: 0.3 10*3/uL (ref 0.1–0.9)
Monocytes: 6 %
Neutrophils Absolute: 3 10*3/uL (ref 1.4–7.0)
Neutrophils: 54 %
Platelets: 231 10*3/uL (ref 150–450)
RBC: 4.3 x10E6/uL (ref 3.77–5.28)
RDW: 12.6 % (ref 11.7–15.4)
WBC: 5.4 10*3/uL (ref 3.4–10.8)

## 2024-05-26 LAB — COMPREHENSIVE METABOLIC PANEL WITH GFR
ALT: 9 [IU]/L (ref 0–32)
AST: 18 [IU]/L (ref 0–40)
Albumin: 4 g/dL (ref 3.9–4.9)
Alkaline Phosphatase: 50 [IU]/L (ref 41–116)
BUN/Creatinine Ratio: 29 — ABNORMAL HIGH (ref 9–23)
BUN: 18 mg/dL (ref 6–24)
Bilirubin Total: 0.3 mg/dL (ref 0.0–1.2)
CO2: 23 mmol/L (ref 20–29)
Calcium: 8.8 mg/dL (ref 8.7–10.2)
Chloride: 105 mmol/L (ref 96–106)
Creatinine, Ser: 0.62 mg/dL (ref 0.57–1.00)
Globulin, Total: 1.8 g/dL (ref 1.5–4.5)
Glucose: 79 mg/dL (ref 70–99)
Potassium: 4.3 mmol/L (ref 3.5–5.2)
Sodium: 140 mmol/L (ref 134–144)
Total Protein: 5.8 g/dL — ABNORMAL LOW (ref 6.0–8.5)
eGFR: 111 mL/min/{1.73_m2}

## 2024-05-26 LAB — VITAMIN D 25 HYDROXY (VIT D DEFICIENCY, FRACTURES): Vit D, 25-Hydroxy: 19.4 ng/mL — AB (ref 30.0–100.0)

## 2024-05-29 ENCOUNTER — Ambulatory Visit: Payer: Self-pay | Admitting: Nurse Practitioner

## 2025-05-27 ENCOUNTER — Encounter: Admitting: Nurse Practitioner
